# Patient Record
Sex: Male | Born: 1955
Health system: Southern US, Community
[De-identification: ages and names within clinical notes are randomized; demographics above are authoritative.]

## PROBLEM LIST (undated history)

## (undated) DIAGNOSIS — I10 Essential (primary) hypertension: Secondary | ICD-10-CM

## (undated) DIAGNOSIS — I4891 Unspecified atrial fibrillation: Secondary | ICD-10-CM

## (undated) DIAGNOSIS — I639 Cerebral infarction, unspecified: Secondary | ICD-10-CM

## (undated) DIAGNOSIS — M109 Gout, unspecified: Secondary | ICD-10-CM

## (undated) DIAGNOSIS — J45909 Unspecified asthma, uncomplicated: Secondary | ICD-10-CM

## (undated) DIAGNOSIS — Z22322 Carrier or suspected carrier of Methicillin resistant Staphylococcus aureus: Secondary | ICD-10-CM

## (undated) HISTORY — DX: Unspecified atrial fibrillation: I48.91

## (undated) HISTORY — DX: Gout, unspecified: M10.9

## (undated) HISTORY — DX: Unspecified asthma, uncomplicated: J45.909

## (undated) HISTORY — DX: Cerebral infarction, unspecified: I63.9

---

## 2002-05-29 ENCOUNTER — Emergency Department (HOSPITAL_COMMUNITY): Admission: EM | Admit: 2002-05-29 | Discharge: 2002-05-29 | Payer: Self-pay | Admitting: Emergency Medicine

## 2002-05-29 ENCOUNTER — Encounter: Payer: Self-pay | Admitting: Emergency Medicine

## 2007-03-20 DIAGNOSIS — Z22322 Carrier or suspected carrier of Methicillin resistant Staphylococcus aureus: Secondary | ICD-10-CM

## 2007-03-20 HISTORY — DX: Carrier or suspected carrier of methicillin resistant Staphylococcus aureus: Z22.322

## 2007-12-08 ENCOUNTER — Inpatient Hospital Stay (HOSPITAL_COMMUNITY): Admission: EM | Admit: 2007-12-08 | Discharge: 2007-12-12 | Payer: Self-pay | Admitting: Emergency Medicine

## 2010-08-01 NOTE — Discharge Summary (Signed)
[name], [name]              ACCOUNT NO.:  [id number]   MEDICAL RECORD NO.:  [id number]          PATIENT TYPE:  INP   LOCATION:  5523                         FACILITY:  [facility]   PHYSICIAN:  Renee Ramus, MD       DATE OF BIRTH:  April 29, 1955   DATE OF ADMISSION:  12/08/2007  DATE OF DISCHARGE:  12/12/2007                               DISCHARGE SUMMARY   PRIMARY DISCHARGE DIAGNOSIS:  Cellulitis.   SECONDARY DIAGNOSES:  1. Hypertension.  2. Mild increase in LFTs and total bilirubin with transient      thrombocytopenia.   HOSPITAL COURSE:  1. Cellulitis.  The patient is a 55 year old African American male who      was admitted secondary to cellulitis with phlegmon extending into      his groin.  The patient seen in emergency department admitted to      our service.  The patient was placed on broad-spectrum antibiotics.      She had elevated white count upon admission, this decreased      dramatically with antibiotics.  The patient did have a grandchild      who had the same type of infection treated with Bactrim with good      results.  The patient's clinical symptom is now improved      dramatically and he is being sent home with ciprofloxacin 500 mg      p.o. b.i.d. x14 days.  The patient has no previous history of      cellulitis, and has been warned that he will be predisposed to      cellulitis infections in the future.  The patient has no known      compromise of his immune system.  2. Hypertension.  The patient initially was somewhat hypotensive, now      he his hypertension has returned, we will place him on lisinopril      20 mg p.o. daily.  He is to follow up with his primary care      physician post discharge for adjustments of medications and dosage.  3. Transient thrombocytopenia with increased LFTs.  The patient's LFTs      have increased with AST of 141, ALT of 97.  The patient also had a      slight increase in total bilirubin at 1.4.  The patient has no  symptoms of liver disease.  He does admit to alcohol consumption.      We have cautioned him to stop this.  The patient did have transient      thrombocytopenia with platelet count of 110 decreasing to 95 and      increasing to 141 on the day of discharge.   LABS OF NOTE:  1. Initial leukocytosis with a white count of 21.7 decreasing to 9.0      on day of discharge.  2. Thrombocytopenia as above.  3. Mild increase in blood glucose between 104 and 128.  4. Bilirubin and LFTs as previously dictated.  5. Drug screen, on admission shows no evidence of drugs of abuse.  6. CRP of 4.9.  7. Blood cultures x2 showing no growth to date.   STUDIES:  1. CT of the pelvis showing evidence of phlegm on the right groin      without hernia or abscess.  2. Ultrasound of the scrotum showing significant soft tissue swelling      in the right wall with question of edema versus abscess of the      right groin.   MEDICATIONS ON DISCHARGE:  1. Lisinopril 20 mg p.o. daily.  2. Ciprofloxacin 500 mg p.o. daily x14 days.   There were no labs or studies pending at the time of discharge.  The  patient is in stable condition and anxious for discharge.   TIME SPENT:  Thirty five minutes.      Renee Ramus, MD  Electronically Signed     JF/MEDQ  D:  12/12/2007  T:  12/13/2007  Job:  [id number]   cc:   Almond Lint, MD

## 2010-08-01 NOTE — Consult Note (Signed)
NAMESHYLOH, [name] [name]              ACCOUNT NO.:  [id number]   MEDICAL RECORD NO.:  [id number]          PATIENT TYPE:  INP   LOCATION:  5523                         FACILITY:  [facility]   PHYSICIAN:  Almond Lint, MD       DATE OF BIRTH:  [birth year]   DATE OF CONSULTATION:  12/10/2007  DATE OF DISCHARGE:                                 CONSULTATION   REQUESTING PHYSICIAN:  Dr. Janice Norrie.   REASON FOR CONSULTATION:  Groin abscess.   HISTORY OF PRESENT ILLNESS:  [name] [name] is a 55 year old, otherwise  healthy male patient who presented to the ER on December 08, 2007 after  complaints of several days of pain and swelling in the right groin area  with associated redness and fevers.  The area started as a small bump in  the right groin and gradually increased in size with increasing pain.  When he presented to the ER, he was found to have a fever of 103, white  count was 21,700.  CT of the pelvis demonstrated no drainable abscess or  hernia, only inflammatory phlegmonous process.  Since admission, the  patient has been treated with IV Zosyn and vancomycin with concordant  decrease in the leukocytosis and decrease in redness and pain and  overall swelling of the right groin.  The patient has been applying warm  compresses to the right groin area and he has developed a spontaneous  opening in the groin region.  A small pinhole area at the most distal  and medial aspect of the groin draining serous and purulent fluid.  Over  the past 24 hours, the patient has developed new soft scrotal edema on  the right side.   REVIEW OF SYSTEMS:  GU:  No hematuria.  No pain with voiding or  difficulty starting his urine void.  GI:  No pain or pressure with  defecation.  CONSTITUTIONAL:  Fever and chills preadmission.  Otherwise,  review of systems categories are noncontributory.   FAMILY HISTORY:  Noncontributory.   SOCIAL HISTORY:  The patient quit smoking in 2008.  He is married.   PAST MEDICAL HISTORY:   The patient denies.   PAST SURGICAL HISTORY:  Cystocele repair about age 84.   ALLERGIES:  NKDA.   CURRENT MEDICATIONS:  While here are as follows:  Metoprolol, Zosyn, and  vancomycin, IV Dilaudid, IV fluids, p.r.n. Zofran, p.r.n. Tylenol,  p.r.n. plain oxycodone.   PHYSICAL EXAMINATION:  GENERAL:  Pleasant male patient complaining of  continued pain in the right groin, but otherwise improved as compared to  prior to admission.  VITAL SIGNS:  Temperature 98.3, BP 134/93, pulse 73, respirations 20.  PSYCH:  The patient is alert and oriented x3.  His affect is appropriate  to current situation.  NEURO:  Cranial nerves II-XII are grossly intact.  The patient is  ambulatory.  Strength is symmetrical, 6/6 bilaterally.  Sensation is  intact grossly.  CHEST:  Bilateral lung sounds are clear to auscultation.  Respiratory  effort is nonlabored.  He is on room air sating 96%.  CARDIAC:  Heart  sounds are S1 and  S2 without rubs, murmurs, thrills, or gallops.  No  JVD.  No tachycardia.  No peripheral edema.  ABDOMEN:  Soft, nontender,  nondistended with bowel sounds present.  He is tolerating a regular  diet.  GENITOURINARY:  The patient has right groin swelling and induration and  erythema.  In the medial distal aspect, there is a pinhole opening with  serous purulent fluid.  He is quite tender in the groin.  He is mildly  tender in the right scrotal region.  There is edema there.  Both  testicles are palpable, mildly tender on the right.  When the testicle  is pushed up towards the right groin, there is exquisite pain more or  less coming from the groin and not the testicle.  The penis is normal in  size and no drainage.  The scrotal edema again has no pitting and the  estimated size of the groin induration is 7 x 5 cm.   LABORATORY DATA:  White count is now 14,900, hemoglobin 13.2, platelets  99,000.  Sodium 134, potassium 4.2, CO2 of 25, BUN 10, creatinine 1.08.  Total bilirubin 1.6,  AST 73, ALT 71.  Blood cultures pending.  Diagnostic CT of the pelvis was done with contrast.  This again  demonstrated an inflammatory phlegmon in the right groin, subcu tissues  without hernia or abscess.   IMPRESSION:  1. Right groin cellulitis with evolving abscess with extension into      the scrotum.  2. Leukocytosis, which is improved.   PLAN:  1. Continue current medical therapy, the patient is overall improving.      Continue symptomatic treating with pain medications and continue IV      Zosyn and vancomycin.  2. The area is spontaneously draining, would continue warm compresses.      The patient may or may not need incision and drainage in the OR.  I      will make him n.p.o. after midnight.  In any event, we need to      proceed with operative intervention since he is spontaneously      draining and there is no identifiable abscess seen on CT.  This is      unlikely.  3. New scrotal swelling.  We need to check a scrotal ultrasound to      rule out abscess or other intrascrotal process.  4. We will go ahead and give him 2 doses of IV Toradol over the next 8      hours for inflammatory pain and changes.  5. Encourage use of p.o. pain medicines to increase duration of effect      of the pain medications.      Allison L. Rennis Harding, N.P.      Almond Lint, MD  Electronically Signed    ALE/MEDQ  D:  12/10/2007  T:  12/11/2007  Job:  [id number]   cc:   Dr. Janice Norrie

## 2010-08-01 NOTE — H&P (Signed)
NAMEJAMEN, LOISEAU              ACCOUNT NO.:  [id number]   MEDICAL RECORD NO.:  [id number]          PATIENT TYPE:  INP   LOCATION:  1833                         FACILITY:  [facility]   PHYSICIAN:  Lonia Blood, M.D.      DATE OF BIRTH:  20-Oct-1955   DATE OF ADMISSION:  12/08/2007  DATE OF DISCHARGE:                              HISTORY & PHYSICAL   PRIMARY CARE PHYSICIAN:  The patient is unassigned.   PRESENTING COMPLAINT:  Right groin swelling, pain, and fever.   HISTORY OF PRESENT ILLNESS:  The patient is a 55 year old gentleman with  no significant past medical history who presented with swelling, pain,  and fever in his right groin.  The area swollen over the past couple of  days is red.  The patient has not had a previous infection.  His  grandson, however, had three different infections that were treated as  staph infections.  The swelling has enlarged and last night he and his  wife tried to squeeze pus out of it and get some drains.  He was seen  in the ED.  The area looks indurated and no I and D was performed.   His past medical history is none.   ALLERGIES:  No known drug allergies.   MEDICATIONS:  No current medications.   SOCIAL HISTORY:  The patient lives in [place] with his wife.  He  denied any current tobacco.  He quit smoking last year.  Prior to that,  he was smoking about half pack per day.   FAMILY HISTORY:  No family history of diabetes, hypertension, etc.   REVIEW OF SYSTEMS:  A 12-point review of systems is negative except per  HPI.   PHYSICAL EXAMINATION:  VITAL SIGNS:  Temperature was 103, blood pressure  initially 75/52, pulse of 110, respiratory rate 28, sats 96% on room  air.  Currently, blood pressure is 140/74 with a pulse of 89.  GENERAL:  The patient is awake, alert, oriented, stable in no acute  distress.  HEENT:  PERRLA.  EOMI.  NECK:  Supple.  No JVD, no lymphadenopathy.  RESPIRATORY:  He has good air entry bilaterally.  No wheezes or  rales.  CARDIOVASCULAR:  The patient has S1 and S2.  No murmur.  ABDOMEN:  Soft and nontender with positive bowel sounds.  EXTREMITIES:  No edema, cyanosis, or clubbing.   LABS:  Sodium 136, potassium 3.8, chloride 104, BUN 18, creatinine 1.2,  glucose 128, ionized calcium 1.23.  White count is 21.7, hemoglobin  15.9, and platelet count 110 with ANC of 17.  CT of abdomen and pelvis  without contrast showed inflammatory phlegmon.  CT pelvis with contrast  medium showed inflammatory phlegmon in the right groin subcutaneous  tissues without hernia or abscess identified.  This extends down into  the right scrotum.  No intraperitoneal abscess.  Also, mildly dilated  loop of small bowel above the level of iliac crest.  No distal dilated  small bowel evident.   ASSESSMENT:  Therefore, this is a 55 year old gentleman presenting with  what appears to be a cellulitis of his  medial thigh involving the  inguinal region, that is indurated, red, tender.  Also, some  leukocytosis and hypotension.  Sepsis-like picture.   PLAN:  1. Right medial thigh cellulitis and phlegmon.  We will admit the      patient and start him vancomycin and Zosyn.  Most likely this is      staph infection with a history of contact with his grandson.  We      will, however, empirically add Zosyn until will get blood cultures      back.  We will try and see if we can get a wound culture since it      has something like a small opening and continuous pain control.      Once we have identified the bacteria, we will switch him to oral      antibiotics and discharge him.  2. Hypertension.  Currently blood pressure has improved.  3. Leukocytosis.  This is most likely from his infection.  4. Thrombocytopenia.  The patient had thrombocytopenia, which may be      reactive.  We will follow his CBC and BMET.  Otherwise, further      treatment will depend on the patient's response in the hospital.      Lonia Blood, M.D.   Electronically Signed     LG/MEDQ  D:  12/08/2007  T:  12/09/2007  Job:  [id number]

## 2010-12-18 LAB — CBC
HCT: 47.4
Hemoglobin: 14.2
Hemoglobin: 15.9
MCHC: 33.5
MCHC: 33.5
MCHC: 33.6
MCHC: 33.8
MCHC: 34.2
MCV: 96.8
MCV: 97
MCV: 97.7
MCV: 98
Platelets: 101 — ABNORMAL LOW
Platelets: 110 — ABNORMAL LOW
Platelets: 141 — ABNORMAL LOW
RBC: 4.01 — ABNORMAL LOW
RBC: 4.32
RBC: 4.85
RDW: 13.8
RDW: 14
WBC: 21.7 — ABNORMAL HIGH

## 2010-12-18 LAB — POCT I-STAT, CHEM 8
BUN: 18
Chloride: 104
Creatinine, Ser: 1.2
Sodium: 136

## 2010-12-18 LAB — DIFFERENTIAL
Basophils Absolute: 0.1
Basophils Relative: 0
Eosinophils Absolute: 0.1
Eosinophils Relative: 0
Eosinophils Relative: 5
Lymphocytes Relative: 12
Lymphocytes Relative: 19
Lymphs Abs: 1.7
Lymphs Abs: 2.6
Monocytes Absolute: 2 — ABNORMAL HIGH
Monocytes Relative: 9
Neutro Abs: 17 — ABNORMAL HIGH
Neutro Abs: 5.7
Neutrophils Relative %: 78 — ABNORMAL HIGH

## 2010-12-18 LAB — BASIC METABOLIC PANEL
BUN: 11
CO2: 22
Chloride: 107
Creatinine, Ser: 1.01
Glucose, Bld: 104 — ABNORMAL HIGH

## 2010-12-18 LAB — COMPREHENSIVE METABOLIC PANEL
ALT: 71 — ABNORMAL HIGH
AST: 141 — ABNORMAL HIGH
AST: 73 — ABNORMAL HIGH
Albumin: 3 — ABNORMAL LOW
BUN: 11
BUN: 13
CO2: 21
CO2: 25
CO2: 26
Calcium: 8.7
Calcium: 8.9
Calcium: 9.6
Creatinine, Ser: 0.83
Creatinine, Ser: 1.28
GFR calc Af Amer: >60
GFR calc Af Amer: >60
GFR calc non Af Amer: 59 — ABNORMAL LOW
GFR calc non Af Amer: >60
GFR calc non Af Amer: >60
Glucose, Bld: 95
Sodium: 134 — ABNORMAL LOW
Total Protein: 5.9 — ABNORMAL LOW

## 2010-12-18 LAB — WOUND CULTURE

## 2010-12-18 LAB — CULTURE, BLOOD (ROUTINE X 2)

## 2010-12-18 LAB — HEMOGLOBIN A1C
Hgb A1c MFr Bld: 5.5
Mean Plasma Glucose: 111

## 2010-12-18 LAB — DRUGS OF ABUSE SCREEN W/O ALC, ROUTINE URINE
Cocaine Metabolites: NEGATIVE
Creatinine,U: 90.5
Methadone: NEGATIVE
Opiate Screen, Urine: NEGATIVE
Propoxyphene: NEGATIVE

## 2018-02-20 ENCOUNTER — Inpatient Hospital Stay (HOSPITAL_COMMUNITY)
Admission: EM | Admit: 2018-02-20 | Discharge: 2018-02-22 | DRG: 065 | Disposition: A | Discharge status: Home / Self Care | Payer: Self-pay | Attending: Internal Medicine | Admitting: Internal Medicine

## 2018-02-20 ENCOUNTER — Emergency Department (HOSPITAL_COMMUNITY): Payer: Self-pay

## 2018-02-20 ENCOUNTER — Other Ambulatory Visit: Payer: Self-pay

## 2018-02-20 ENCOUNTER — Encounter (HOSPITAL_COMMUNITY): Payer: Self-pay | Admitting: Emergency Medicine

## 2018-02-20 DIAGNOSIS — G4730 Sleep apnea, unspecified: Secondary | ICD-10-CM | POA: Diagnosis present

## 2018-02-20 DIAGNOSIS — G8191 Hemiplegia, unspecified affecting right dominant side: Secondary | ICD-10-CM | POA: Diagnosis present

## 2018-02-20 DIAGNOSIS — D696 Thrombocytopenia, unspecified: Secondary | ICD-10-CM | POA: Diagnosis present

## 2018-02-20 DIAGNOSIS — I4891 Unspecified atrial fibrillation: Secondary | ICD-10-CM

## 2018-02-20 DIAGNOSIS — Z87891 Personal history of nicotine dependence: Secondary | ICD-10-CM

## 2018-02-20 DIAGNOSIS — Z79899 Other long term (current) drug therapy: Secondary | ICD-10-CM

## 2018-02-20 DIAGNOSIS — B1920 Unspecified viral hepatitis C without hepatic coma: Secondary | ICD-10-CM | POA: Diagnosis present

## 2018-02-20 DIAGNOSIS — R0789 Other chest pain: Secondary | ICD-10-CM | POA: Diagnosis present

## 2018-02-20 DIAGNOSIS — R531 Weakness: Secondary | ICD-10-CM

## 2018-02-20 DIAGNOSIS — I10 Essential (primary) hypertension: Secondary | ICD-10-CM | POA: Diagnosis present

## 2018-02-20 DIAGNOSIS — Z8249 Family history of ischemic heart disease and other diseases of the circulatory system: Secondary | ICD-10-CM

## 2018-02-20 DIAGNOSIS — Z7982 Long term (current) use of aspirin: Secondary | ICD-10-CM

## 2018-02-20 DIAGNOSIS — R740 Nonspecific elevation of levels of transaminase and lactic acid dehydrogenase [LDH]: Secondary | ICD-10-CM

## 2018-02-20 DIAGNOSIS — F1011 Alcohol abuse, in remission: Secondary | ICD-10-CM

## 2018-02-20 DIAGNOSIS — I480 Paroxysmal atrial fibrillation: Secondary | ICD-10-CM | POA: Diagnosis present

## 2018-02-20 DIAGNOSIS — F17211 Nicotine dependence, cigarettes, in remission: Secondary | ICD-10-CM

## 2018-02-20 DIAGNOSIS — I639 Cerebral infarction, unspecified: Secondary | ICD-10-CM

## 2018-02-20 DIAGNOSIS — F4521 Hypochondriasis: Secondary | ICD-10-CM | POA: Diagnosis present

## 2018-02-20 DIAGNOSIS — Z885 Allergy status to narcotic agent status: Secondary | ICD-10-CM

## 2018-02-20 DIAGNOSIS — E785 Hyperlipidemia, unspecified: Secondary | ICD-10-CM | POA: Diagnosis present

## 2018-02-20 DIAGNOSIS — R7401 Elevation of levels of liver transaminase levels: Secondary | ICD-10-CM

## 2018-02-20 DIAGNOSIS — R079 Chest pain, unspecified: Secondary | ICD-10-CM

## 2018-02-20 DIAGNOSIS — R208 Other disturbances of skin sensation: Secondary | ICD-10-CM

## 2018-02-20 DIAGNOSIS — R29700 NIHSS score 0: Secondary | ICD-10-CM | POA: Diagnosis present

## 2018-02-20 DIAGNOSIS — I6340 Cerebral infarction due to embolism of unspecified cerebral artery: Principal | ICD-10-CM | POA: Diagnosis present

## 2018-02-20 DIAGNOSIS — Z823 Family history of stroke: Secondary | ICD-10-CM

## 2018-02-20 HISTORY — DX: Carrier or suspected carrier of methicillin resistant Staphylococcus aureus: Z22.322

## 2018-02-20 HISTORY — DX: Essential (primary) hypertension: I10

## 2018-02-20 LAB — CBC WITH DIFFERENTIAL/PLATELET
ABS IMMATURE GRANULOCYTES: 0.02 10*3/uL (ref 0.00–0.07)
BASOS ABS: 0 10*3/uL (ref 0.0–0.1)
BASOS PCT: 1 %
Eosinophils Absolute: 0.1 10*3/uL (ref 0.0–0.5)
Eosinophils Relative: 2 %
HCT: 50.5 % (ref 39.0–52.0)
Hemoglobin: 16.1 g/dL (ref 13.0–17.0)
Immature Granulocytes: 0 %
LYMPHS ABS: 2.4 10*3/uL (ref 0.7–4.0)
Lymphocytes Relative: 36 %
MCH: 30.4 pg (ref 26.0–34.0)
MCHC: 31.9 g/dL (ref 30.0–36.0)
MCV: 95.5 fL (ref 80.0–100.0)
MONOS PCT: 11 %
Monocytes Absolute: 0.7 10*3/uL (ref 0.1–1.0)
NEUTROS ABS: 3.3 10*3/uL (ref 1.7–7.7)
NRBC: 0 % (ref 0.0–0.2)
Neutrophils Relative %: 50 %
PLATELETS: 126 10*3/uL — AB (ref 150–400)
RBC: 5.29 MIL/uL (ref 4.22–5.81)
RDW: 14 % (ref 11.5–15.5)
WBC: 6.5 10*3/uL (ref 4.0–10.5)

## 2018-02-20 LAB — COMPREHENSIVE METABOLIC PANEL
ALT: 251 U/L — ABNORMAL HIGH (ref 0–44)
AST: 302 U/L — AB (ref 15–41)
Albumin: 3.6 g/dL (ref 3.5–5.0)
Alkaline Phosphatase: 95 U/L (ref 38–126)
Anion gap: 11 (ref 5–15)
BUN: 17 mg/dL (ref 8–23)
CHLORIDE: 106 mmol/L (ref 98–111)
CO2: 19 mmol/L — AB (ref 22–32)
Calcium: 9.6 mg/dL (ref 8.9–10.3)
Creatinine, Ser: 0.95 mg/dL (ref 0.61–1.24)
GFR calc Af Amer: >60 mL/min (ref >60)
GFR calc non Af Amer: >60 mL/min (ref >60)
GLUCOSE: 101 mg/dL — AB (ref 70–99)
POTASSIUM: 4.2 mmol/L (ref 3.5–5.1)
SODIUM: 136 mmol/L (ref 135–145)
Total Bilirubin: 2 mg/dL — ABNORMAL HIGH (ref 0.3–1.2)
Total Protein: 7.3 g/dL (ref 6.5–8.1)

## 2018-02-20 LAB — RAPID URINE DRUG SCREEN, HOSP PERFORMED
Amphetamines: NOT DETECTED
BARBITURATES: NOT DETECTED
Benzodiazepines: NOT DETECTED
COCAINE: NOT DETECTED
OPIATES: NOT DETECTED
Tetrahydrocannabinol: NOT DETECTED

## 2018-02-20 LAB — URINALYSIS, ROUTINE W REFLEX MICROSCOPIC
BILIRUBIN URINE: NEGATIVE
Glucose, UA: NEGATIVE mg/dL
KETONES UR: NEGATIVE mg/dL
Leukocytes, UA: NEGATIVE
Nitrite: NEGATIVE
Protein, ur: NEGATIVE mg/dL
Specific Gravity, Urine: 1.021 (ref 1.005–1.030)
pH: 5 (ref 5.0–8.0)

## 2018-02-20 LAB — ETHANOL: Alcohol, Ethyl (B): <10 mg/dL (ref <10)

## 2018-02-20 LAB — TSH: TSH: 3.086 u[IU]/mL (ref 0.350–4.500)

## 2018-02-20 LAB — TROPONIN I: Troponin I: <0.03 ng/mL (ref <0.03)

## 2018-02-20 MED ORDER — ACETAMINOPHEN 325 MG PO TABS
650.0000 mg | ORAL_TABLET | Freq: Four times a day (QID) | ORAL | Status: DC | PRN
  Administered 2018-02-21 – 2018-02-22 (×2): 650 mg via ORAL
  Filled 2018-02-20 (×2): qty 2

## 2018-02-20 MED ORDER — SENNOSIDES-DOCUSATE SODIUM 8.6-50 MG PO TABS: 1.0000 | ORAL_TABLET | Freq: Every evening | ORAL | Status: DC | PRN

## 2018-02-20 MED ORDER — RIVAROXABAN 20 MG PO TABS
20.0000 mg | ORAL_TABLET | ORAL | Status: AC
  Administered 2018-02-20: 20 mg via ORAL
  Filled 2018-02-20 (×2): qty 1

## 2018-02-20 MED ORDER — ENOXAPARIN SODIUM 40 MG/0.4ML ~~LOC~~ SOLN: 40.0000 mg | SUBCUTANEOUS | Status: DC

## 2018-02-20 MED ORDER — RIVAROXABAN 20 MG PO TABS
20.0000 mg | ORAL_TABLET | Freq: Every day | ORAL | Status: DC
  Administered 2018-02-21 – 2018-02-22 (×2): 20 mg via ORAL
  Filled 2018-02-20 (×3): qty 1

## 2018-02-20 MED ORDER — METOPROLOL SUCCINATE 12.5 MG HALF TABLET: 12.5000 mg | ORAL_TABLET | Freq: Once | ORAL | Status: DC

## 2018-02-20 MED ORDER — PROMETHAZINE HCL 25 MG PO TABS: 12.5000 mg | ORAL_TABLET | Freq: Four times a day (QID) | ORAL | Status: DC | PRN

## 2018-02-20 MED ORDER — ACETAMINOPHEN 650 MG RE SUPP: 650.0000 mg | Freq: Four times a day (QID) | RECTAL | Status: DC | PRN

## 2018-02-20 MED ORDER — METOPROLOL SUCCINATE ER 25 MG PO TB24
25.0000 mg | ORAL_TABLET | Freq: Once | ORAL | Status: AC
  Administered 2018-02-20: 25 mg via ORAL
  Filled 2018-02-20: qty 1

## 2018-02-20 NOTE — ED Notes (Signed)
Patient transported to X-ray 

## 2018-02-20 NOTE — ED Notes (Signed)
Pt requests update please.

## 2018-02-20 NOTE — ED Notes (Signed)
Pt ambulatory to bathroom with no reported issues. 

## 2018-02-20 NOTE — ED Triage Notes (Signed)
Pt reports a terrible h/a, "sleepiness" and aching central CP that radiates to his right arm causing it to be numb.

## 2018-02-20 NOTE — ED Notes (Signed)
Pt transported to XR.  

## 2018-02-20 NOTE — ED Provider Notes (Signed)
[facility] EMERGENCY DEPARTMENT Provider Note   CSN: [id number] Arrival date & time: 02/20/18  1112     History   Chief Complaint Chief Complaint  Patient presents with  . Chest Pain    HPI [name] [name] is a 62 y.o. male.  HPI Patient presents with a few different complaints.  States he has had chest pain over the last few days.  States he has been more sleepy.  States he is been confused.  States he reached up today while he was in the shower and his right arm went numb at home down and he could not use it.  States it improving a little bit now.  States it started acutely.  Does have some shoulder and neck pain. Past Medical History:  Diagnosis Date  . Hypertension   . MRSA (methicillin resistant staph aureus) culture positive 2009    There are no active problems to display for this patient.   History reviewed. No pertinent surgical history.      Home Medications    Prior to Admission medications   Not on File    Family History History reviewed. No pertinent family history.  Social History Social History   Tobacco Use  . Smoking status: Former Smoker    Years: 53.00    Types: Cigarettes    Last attempt to quit: 07/21/2017    Years since quitting: 0.5  . Smokeless tobacco: Never Used  Substance Use Topics  . Alcohol use: Yes    Comment: occ  . Drug use: Not Currently     Allergies   Codeine   Review of Systems Review of Systems  Constitutional: Positive for appetite change.  HENT: Negative for congestion.   Respiratory: Negative for cough.   Cardiovascular: Positive for chest pain.  Gastrointestinal: Negative for abdominal pain.  Genitourinary: Negative for flank pain.  Musculoskeletal: Positive for back pain.  Skin: Negative for rash.  Neurological: Positive for weakness, numbness and headaches.  Psychiatric/Behavioral: Positive for confusion.     Physical Exam Updated Vital Signs BP (!) 165/140   Pulse 86    Temp 98.3 F (36.8 C) (Oral)   Resp 14   Ht 6\' 4"  (1.93 m)   Wt 104.3 kg   SpO2 97%   BMI 28.00 kg/m   Physical Exam  Constitutional: He appears well-developed.  HENT:  Head: Normocephalic.  Eyes: EOM are normal.  Neck: Neck supple.  Cardiovascular: Normal pulses.  Irregular rhythm.  Pulmonary/Chest: Effort normal.  Abdominal: Soft. There is no tenderness.  Musculoskeletal:       Right lower leg: He exhibits no tenderness.       Left lower leg: He exhibits no tenderness.  Neurological: He is alert.  Awake and appropriate.  Subjectively decreased sensation in right hand although strength appears to be intact.  Good strength in bilateral lower extremities.  Good straight leg raise bilaterally.  Face symmetric.  Skin: Skin is warm. Capillary refill takes less than 2 seconds.     ED Treatments / Results  Labs (all labs ordered are listed, but only abnormal results are displayed) Labs Reviewed  COMPREHENSIVE METABOLIC PANEL - Abnormal; Notable for the following components:      Result Value   CO2 19 (*)    Glucose, Bld 101 (*)    AST 302 (*)    ALT 251 (*)    Total Bilirubin 2.0 (*)    All other components within normal limits  URINALYSIS, ROUTINE W REFLEX  MICROSCOPIC - Abnormal; Notable for the following components:   Color, Urine AMBER (*)    Hgb urine dipstick SMALL (*)    Bacteria, UA RARE (*)    All other components within normal limits  CBC WITH DIFFERENTIAL/PLATELET - Abnormal; Notable for the following components:   Platelets 126 (*)    All other components within normal limits  RAPID URINE DRUG SCREEN, HOSP PERFORMED  ETHANOL  TROPONIN I  TSH    EKG EKG Interpretation  Date/Time:  Thursday February 20 2018 11:20:08 EST Ventricular Rate:  119 PR Interval:    QRS Duration: 101 QT Interval:  336 QTC Calculation: 473 R Axis:   -12 Text Interpretation:  Atrial fibrillation with rapid ventricular response Confirmed by Raeford RazorKohut, Stephen [phone]([id number]) on 02/20/2018  12:30:14 PM   Radiology Dg Chest 2 View  Result Date: 02/20/2018 CLINICAL DATA:  Mid chest pain for 1 month. EXAM: CHEST - 2 VIEW COMPARISON:  None. FINDINGS: Cardiomediastinal silhouette is normal. Mediastinal contours appear intact. 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 There is no evidence of focal airspace consolidation, pleural effusion or pneumothorax. Osseous structures are without acute abnormality. Soft tissues are grossly normal. IMPRESSION: No active cardiopulmonary disease. 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 Electronically Signed   By: Ted Mcalpineobrinka  Dimitrova M.D.   On: 02/20/2018 12:23   Ct Head Wo Contrast  Result Date: 02/20/2018 CLINICAL DATA:  Headache EXAM: CT HEAD WITHOUT CONTRAST TECHNIQUE: Contiguous axial images were obtained from the base of the skull through the vertex without intravenous contrast. COMPARISON:  None. FINDINGS: Brain: Minimal small vessel ischemic disease periventricular white matter. No intra-axial mass nor extra-axial fluid. No hydrocephalus. Midline fourth ventricle basal cisterns without effacement. No large vascular territory infarct. Vascular: No hyperdense vessel sign. Mild atherosclerosis of the carotid siphons. Skull: Intact Sinuses/Orbits: No acute finding. Other: None. IMPRESSION: Minimal small vessel ischemic disease of periventricular white matter, age indeterminate in the absence of prior studies more likely chronic given vascular calcifications at the skull base. No acute intracranial appearing abnormality. Electronically Signed   By: Tollie Ethavid  Kwon M.D.   On: 02/20/2018 14:36    Procedures Procedures (including critical care time)  Medications Ordered in ED Medications - No data to display   Initial Impression / Assessment and Plan / ED Course  I have reviewed the triage vital signs and the nursing notes.  Pertinent labs & imaging results that were available during my care of the patient were reviewed by me and  considered in my medical decision making (see chart for details).     Patient presented with few different complaints.  Chest pain.  Headaches.  States that earlier today his arm went numb and he could not use it.  Right arm is improving.  However patient appears to be in a new onset A. Fib. Chadsvasc of 1 for hypertension.  Head CT reassuring.  AST and ALT are somewhat elevated.  Patient states he has had hepatitis previously.  Will admit to unassigned internal medicine for further work-up.  Final Clinical Impressions(s) / ED Diagnoses   Final diagnoses:  Atrial fibrillation, unspecified type (HCC)  Hypertension, unspecified type  Chest pain, unspecified type  Weakness    ED Discharge Orders    None       Benjiman CorePickering, Kim Lauver, MD 02/20/18 1552

## 2018-02-20 NOTE — H&P (Addendum)
Date: 02/20/2018               Patient Name:  [name] [name] MRN: [id number]  DOB: 05-Jul-1955 Age / Sex: 62 y.o., male   PCP: Patient, No Pcp Per         Medical Service: Internal Medicine Teaching Service         Attending Physician: Dr. Inez Catalina, MD    First Contact: Dr. Petra Kuba Pager: [phone]  Second Contact: Dr. Delma Officer Pager: [phone]       After Hours (After 5p/  First Contact Pager: [phone]  weekends / holidays): Second Contact Pager: [phone]   Chief Complaint: chest pain, arm numbness  History of Present Illness:  62 year old male with uncontrolled hypertension, former tobacco and alcohol abuse who presents for evaluation of new onset right arm numbness and weakness as well as 1 month of worsening left-sided chest pain.   He is accompanied by his wife.  He states that while in the shower this morning he raised his right arm above his head and immediately experienced numbness of the right arm and then his arm became "dead weight".  His arm fell down to his side and he felt like he had been "punched in his right shoulder".  He then reports trying to pick up the soap with his right hand but could not get his hand to work.  His strength gradually returned within minutes but the numbness remained.   After he got out of the shower he experienced an abrupt, sharp pain located "underneath his rib" on the left side of his chest with associated epigastric discomfort/indigestion.  He denied associated palpitations, vomiting, nausea, diaphoresis, jaw numbness, neck pain. He does endorse intermittent dizziness, worse with standing, and admits to almost falling down.   He reports a history of intermittent numbness in his hands which he attributed to decreased blood flow from his significant tobacco use.  The chest pain he is currently describing has been new over the last 2 to 3 weeks and feels different from his previous chest pain which is located over the center of his chest and  described as a muscle ache.   The chest pain will occur at rest or at exertion. However, he has been able to perform extensive yard work without having chest pain.   He has not seen a doctor in many years and has roughly a 30-pack-year smoking history but proudly announces that he has gone 6 months without smoking after quitting cold [place].  Currently he endorses improved, but residual numbness in the right forearm and intermittent left sided chest pain with epigastric discomfort. He is also experiencing headache (which he attributes to his out of date glasses prescription).   Meds:  Over the counter nasal spray for sinus allergies  Used to be on a blood pressure medicine 10 years ago    Allergies: Allergies as of 02/20/2018 - Review Complete 02/20/2018  Allergen Reaction Noted  . Codeine Nausea And Vomiting 02/20/2018   Past Medical History:  Diagnosis Date  . Hypertension   . MRSA (methicillin resistant staph aureus) culture positive 2009    Family History: Stroke (Dad in his 21s). CAD (mom with triple bypass)    Social History: retired, did many jobs Electrical engineer, courier, oil changes). 30 pack year smoking history but quit 6 months ago. Social drinker, used to drink a lot but now only "pounds them" on special occasions (6 pack plus a couple shots). Distant history of IV  drug use.   Review of Systems: A complete ROS was negative except as per HPI.   Physical Exam: Blood pressure (!) 180/122, pulse (!) 104, temperature 98.3 F (36.8 C), temperature source Oral, resp. rate 14, height 6\' 4"  (1.93 m), weight 104.3 kg, SpO2 99 %. Vitals:   02/20/18 1330 02/20/18 1400 02/20/18 1706 02/20/18 1715  BP: (!) 164/116 (!) 165/140 (!) 173/107 (!) 180/122  Pulse: 94 86 97 (!) 104  Resp: 17 14 15 14   Temp:      TempSrc:      SpO2: 98% 97% 97% 99%  Weight:      Height:       General: Vital signs reviewed.  Patient is well-developed and well-nourished, in no acute distress and  cooperative with exam.  Head: Normocephalic and atraumatic. Eyes: EOMI, conjunctivae normal, no scleral icterus.  Neck: Supple, trachea midline, normal ROM, no JVD, masses, thyromegaly, or carotid bruit present.  Cardiovascular: irregularly irregular, S1 normal, S2 normal, no murmurs, gallops, or rubs. Pulmonary/Chest: Clear to auscultation bilaterally, no wheezes, rales, or rhonchi. Abdominal: Soft, non-tender, non-distended, BS +, no masses, organomegaly, or guarding present.  Musculoskeletal: No joint deformities, erythema, or stiffness, ROM full and nontender. Extremities: No lower extremity edema bilaterally,  pulses symmetric and intact bilaterally. No cyanosis or clubbing. Neurological: A&O x3, Strength is normal and symmetric bilaterally, cranial nerve II-XII are grossly intact, no focal motor deficit, decreased sensation in right forearm but otherwise normal and symmetric  Skin: Warm, dry and intact. No rashes or erythema. Tattoos on both arms Psychiatric: Normal mood and affect. speech and behavior is normal. Cognition and memory are normal.    EKG: personally reviewed my interpretation is a. Fib with RVR, HR 119, no q waves, no ST or T wave changes   CXR: personally reviewed my interpretation is no pulmonary infiltrate, no bony abnormality, mild calcific atherosclerotic aortic disease  Head CT: Minimal small vessel ischemic disease of periventricular white matter, age indeterminate in the absence of prior studies more likely chronic given vascular calcifications at the skull base. No acute intracranial appearing abnormality.  Assessment & Plan by Problem: Active Problems:   TIA (transient ischemic attack)   Paroxysmal atrial fibrillation (HCC)  62 year old male with uncontrolled hypertension, former tobacco and alcohol abuse who presents for evaluation of new onset right arm numbness and weakness as well as 1 month of worsening left-sided chest pain.  Arm numbness: Acute  onset provoked by arm raise. Accompanied by weakness. Improved without intervention. Took 325mg  aspirin at home. Residual numbness remains on right forearm and neuro exam is otherwise unremarkable. CT head was negative for acute abnormality. Doubt TIA given symptoms were provoked with arm raise. More suspicious for claudication or impingement pathology. Most likely has atherosclerotic disease given uncontrolled HTN and prior tobacco abuse.  - aspirin 81mg   - consider right shoulder MRI tomorrow   Chest pain: Intermittent, sharp, left sided without associated n/v, diaphoresis, or jaw numbness. Occurs at rest, not provoked by exertion.  - EKG a fib with RVR without ischemic changes  - initial troponin negative, will trend q6h  A. Fib with RVR: HR 80-106, exact onset is unclear, but could have started in the last month and could explain his chest pain. Tachy in the low 100s, hypertensive. Denies palpitations or sob.  - CHADVASC score 2 (HTN, aortic plaque on CXR) - xarelto per pharmacy  - rate control with metoprolol XR 25mg  daily, can add IV diltiazem if not rate controlled.  -  echo  Transaminitis: Reports history of "exposure to hepatitis" but has never been treated. Has tattoos and distant history of IV drug use.  - hep c  FEN/GI: HH diet  DVT ppx: xarelto   Dispo: Admit patient to Inpatient with expected length of stay greater than 2 midnights.  Signed: Ali Lowe, MD 02/20/2018, 5:42 PM  Pager: [phone]

## 2018-02-20 NOTE — Progress Notes (Signed)
ANTICOAGULATION CONSULT NOTE - Initial Consult  Pharmacy Consult for Xarelto Indication: atrial fibrillation  Allergies  Allergen Reactions  . Codeine Nausea And Vomiting    Patient Measurements: Height: 6\' 4"  (193 cm) Weight: 230 lb (104.3 kg) IBW/kg (Calculated) : 86.8  Vital Signs: Temp: 98.3 F (36.8 C) (12/05 1122) Temp Source: Oral (12/05 1122) BP: 180/122 (12/05 1715) Pulse Rate: 104 (12/05 1715)  Labs: Recent Labs    02/20/18 1137  HGB 16.1  HCT 50.5  PLT 126*  CREATININE 0.95  TROPONINI <0.03    Estimated Creatinine Clearance: 105.6 mL/min (by C-G formula based on SCr of 0.95 mg/dL).   Medical History: Past Medical History:  Diagnosis Date  . Hypertension   . MRSA (methicillin resistant staph aureus) culture positive 2009    Assessment: CC/HPI: CP for a few days with increased sleepiness and confusion. New onset afib.  PMH: HTN, MRSA 2009, hepatitis  Anticoag: Afib to start Xarelto. Hgb 16.1 WNL. Plts 126, CrCl>100  Goal of Therapy:  Therapeutic oral anticoagulation   Plan: New Xarelto 20mg  daily F/u w/u for elevated LFT's   [name] [name], PharmD, BCPS Clinical Staff Pharmacist [name] [name], [name] [name] 02/20/2018,7:14 PM

## 2018-02-20 NOTE — ED Notes (Signed)
Patient transported to CT 

## 2018-02-21 ENCOUNTER — Inpatient Hospital Stay (HOSPITAL_COMMUNITY): Payer: Self-pay

## 2018-02-21 DIAGNOSIS — I361 Nonrheumatic tricuspid (valve) insufficiency: Secondary | ICD-10-CM

## 2018-02-21 DIAGNOSIS — G8321 Monoplegia of upper limb affecting right dominant side: Secondary | ICD-10-CM

## 2018-02-21 DIAGNOSIS — I4891 Unspecified atrial fibrillation: Secondary | ICD-10-CM

## 2018-02-21 DIAGNOSIS — Z87891 Personal history of nicotine dependence: Secondary | ICD-10-CM

## 2018-02-21 DIAGNOSIS — I340 Nonrheumatic mitral (valve) insufficiency: Secondary | ICD-10-CM

## 2018-02-21 DIAGNOSIS — I639 Cerebral infarction, unspecified: Secondary | ICD-10-CM

## 2018-02-21 LAB — BASIC METABOLIC PANEL
Anion gap: 10 (ref 5–15)
BUN: 20 mg/dL (ref 8–23)
CHLORIDE: 106 mmol/L (ref 98–111)
CO2: 20 mmol/L — ABNORMAL LOW (ref 22–32)
Calcium: 9.5 mg/dL (ref 8.9–10.3)
Creatinine, Ser: 0.97 mg/dL (ref 0.61–1.24)
GFR calc Af Amer: >60 mL/min (ref >60)
GFR calc non Af Amer: >60 mL/min (ref >60)
Glucose, Bld: 92 mg/dL (ref 70–99)
POTASSIUM: 4.4 mmol/L (ref 3.5–5.1)
Sodium: 136 mmol/L (ref 135–145)

## 2018-02-21 LAB — CBC
HCT: 46.1 % (ref 39.0–52.0)
Hemoglobin: 15.1 g/dL (ref 13.0–17.0)
MCH: 30.7 pg (ref 26.0–34.0)
MCHC: 32.8 g/dL (ref 30.0–36.0)
MCV: 93.7 fL (ref 80.0–100.0)
Platelets: 102 10*3/uL — ABNORMAL LOW (ref 150–400)
RBC: 4.92 MIL/uL (ref 4.22–5.81)
RDW: 14.1 % (ref 11.5–15.5)
WBC: 5.9 10*3/uL (ref 4.0–10.5)
nRBC: 0 % (ref 0.0–0.2)

## 2018-02-21 LAB — LIPID PANEL
Cholesterol: 133 mg/dL (ref 0–200)
HDL: 25 mg/dL — ABNORMAL LOW (ref >40)
LDL Cholesterol: 84 mg/dL (ref 0–99)
Total CHOL/HDL Ratio: 5.3 RATIO
Triglycerides: 120 mg/dL (ref <150)
VLDL: 24 mg/dL (ref 0–40)

## 2018-02-21 LAB — HEMOGLOBIN A1C
Hgb A1c MFr Bld: 5.1 % (ref 4.8–5.6)
Mean Plasma Glucose: 99.67 mg/dL

## 2018-02-21 LAB — HIV ANTIBODY (ROUTINE TESTING W REFLEX): HIV Screen 4th Generation wRfx: NONREACTIVE

## 2018-02-21 LAB — ECHOCARDIOGRAM COMPLETE
Height: 76 in
Weight: 3680 oz

## 2018-02-21 LAB — TROPONIN I

## 2018-02-21 MED ORDER — IOPAMIDOL (ISOVUE-370) INJECTION 76%
100.0000 mL | Freq: Once | INTRAVENOUS | Status: AC | PRN
  Administered 2018-02-21: 100 mL via INTRAVENOUS

## 2018-02-21 MED ORDER — ASPIRIN EC 81 MG PO TBEC
81.0000 mg | DELAYED_RELEASE_TABLET | Freq: Every day | ORAL | Status: DC
  Administered 2018-02-21 – 2018-02-22 (×2): 81 mg via ORAL
  Filled 2018-02-21 (×2): qty 1

## 2018-02-21 MED ORDER — LISINOPRIL 5 MG PO TABS: 5.0000 mg | ORAL_TABLET | Freq: Every day | ORAL | 2 refills | Status: DC

## 2018-02-21 MED ORDER — LISINOPRIL 5 MG PO TABS
5.0000 mg | ORAL_TABLET | Freq: Every day | ORAL | Status: DC
  Administered 2018-02-21: 5 mg via ORAL
  Filled 2018-02-21: qty 1

## 2018-02-21 MED ORDER — METOPROLOL SUCCINATE ER 25 MG PO TB24
25.0000 mg | ORAL_TABLET | Freq: Once | ORAL | Status: DC
  Filled 2018-02-21: qty 1

## 2018-02-21 MED ORDER — ATORVASTATIN CALCIUM 80 MG PO TABS
80.0000 mg | ORAL_TABLET | Freq: Every day | ORAL | Status: DC
  Administered 2018-02-21 – 2018-02-22 (×2): 80 mg via ORAL
  Filled 2018-02-21 (×2): qty 1

## 2018-02-21 MED ORDER — IOPAMIDOL (ISOVUE-370) INJECTION 76%
INTRAVENOUS | Status: AC
  Filled 2018-02-21: qty 100

## 2018-02-21 MED ORDER — RIVAROXABAN 20 MG PO TABS: 20.0000 mg | ORAL_TABLET | Freq: Every day | ORAL | 2 refills | Status: DC

## 2018-02-21 MED ORDER — METOPROLOL SUCCINATE ER 25 MG PO TB24: 25.0000 mg | ORAL_TABLET | Freq: Every day | ORAL | 2 refills | Status: DC

## 2018-02-21 MED ORDER — ATORVASTATIN CALCIUM 40 MG PO TABS: 40.0000 mg | ORAL_TABLET | Freq: Every day | ORAL | 2 refills | Status: DC

## 2018-02-21 MED ORDER — METOPROLOL SUCCINATE ER 25 MG PO TB24
25.0000 mg | ORAL_TABLET | Freq: Once | ORAL | Status: AC
  Administered 2018-02-21: 25 mg via ORAL
  Filled 2018-02-21: qty 1

## 2018-02-21 MED FILL — LISINOPRIL 5 MG TABLET: 5 | 30 days supply | Qty: 30 | Fill #0 | Status: TO

## 2018-02-21 MED FILL — ATORVASTATIN CALCIUM 40 MG: 40 | 30 days supply | Qty: 30 | Fill #0 | Status: TO

## 2018-02-21 MED FILL — METOPROLOL SUCCINATE ER 25: 25 | 30 days supply | Qty: 30 | Fill #0 | Status: TO

## 2018-02-21 MED FILL — XARELTO 20 MG TABLET: 20 | 30 days supply | Qty: 30 | Fill #0 | Status: TO

## 2018-02-21 NOTE — Progress Notes (Signed)
   Subjective: NAEON. Denies anymore chest pain or arm weakness. Still endorsing a small region of right arm numbness.   Objective:  Vital signs in last 24 hours: Vitals:   02/20/18 2006 02/20/18 2200 02/21/18 0530 02/21/18 0733  BP: (!) 140/100 (!) 165/98 (!) 155/120 (!) 155/106  Pulse: 94 84 86 69  Resp: 14 18 18 20   Temp: 98.6 F (37 C) 98.2 F (36.8 C) 98.4 F (36.9 C) 97.8 F (36.6 C)  TempSrc: Oral Oral Oral Oral  SpO2: 99% 100% 100% 99%  Weight:      Height:       Gen: sitting up in bed. NAD Card: irregularly irregular, no m/r/g, no JVD Neuro: 5/5 strength in all extremities, decreased sensation over dorsal right forearm Ext: no LEE, DP pulses symmetric  MSK: right shoulder with full ROM, non-tender    Assessment/Plan:  Active Problems:   TIA (transient ischemic attack)   Paroxysmal atrial fibrillation (HCC)  62 year old male with uncontrolled hypertension, former tobacco and alcohol abuse who presents for evaluation of new onset right arm numbness and weakness as well as 1 month of worsening left-sided chest pain.  1. Arm numbness: improving. Denies anymore events of arm weakness or shoulder pain. Given his uncontrolled HTN, there is concern for lacunar infarct which could result in a sensory deficit stroke. - brain MR - carotid ultrasound  - risk factor modification: lipid panel, a1c, HTN control   2. Chest pain: resolved. EKG reassuring. Troponins negative x 2. Risk factor modification as above.   3. A. Fib: rate controlled on metoprolol 25mg  daily. Continue xarelto.  - f/u echo   4. Transaminitis: AST: ALT 1 to 1 ratio more consistent for chronic infection - f/u hepatitis B and C panels   5. HTN: lisinopril 5mg  daily   Dispo: Anticipated discharge in approximately 0-1. If additional imaging today is unremarkable, he can go home. Social work will set him up with community health and wellness and we will [facility] get [facility] to deliver an initial 6997-month  supply of medications   Ali LoweVogel,  S, MD 02/21/2018, 11:11 AM Pager: [phone]

## 2018-02-21 NOTE — Care Management Note (Addendum)
Case Management Note  Patient Details  Name: TRAQUAN DUARTE MRN: [id number] Date of Birth: 12/06/1955  Subjective/Objective:     Patient in to r/o CVA. He is from home with his son and DIL.  DME: none Pt has no insurance and no PCP. Pt states son and DIL can provide transportation.               Action/Plan: CM met with the patient and he would like to be connected to one of the [facility]. CM was able to get him an appointment at [facility]. Information on the AVS.  CM provided him information on the [facility] pharmacy also so he can receive assistance with his medications.   Addendum: CM did MATCH for his meds through the [facility]. Pt to receive meds to the bedside. RN updated.   Expected Discharge Date:                  Expected Discharge Plan:     In-House Referral:     Discharge planning Services  CM Consult, [facility], Medication Assistance  Post Acute Care Choice:    Choice offered to:     DME Arranged:    DME Agency:     HH Arranged:    HH Agency:     Status of Service:  In process, will continue to follow  If discussed at Long Length of Stay Meetings, dates discussed:    Additional Comments:  Pollie Friar, RN 02/21/2018, 11:37 AM

## 2018-02-21 NOTE — Progress Notes (Signed)
Bilateral carotid duplex exam completed. Please see preliminary notes on CV PROC under chart review. Makailah Slavick H Nilza Eaker(RDMS RVT) 02/21/18 2:01 PM

## 2018-02-21 NOTE — Discharge Summary (Signed)
Name: [name] [name] MRN: [id number] DOB: October 12, 1955 62 y.o. PCP: Patient, No Pcp Per  Date of Admission: 02/20/2018 11:13 AM Date of Discharge: 02/22/18 Attending Physician: Dr. Criselda Peaches  Discharge Diagnosis: 1. Possible TIA 2. HTN 3. A. Fib 4. Transaminitis   Discharge Medications: Allergies as of 02/22/2018      Reactions   Codeine Nausea And Vomiting      Medication List    STOP taking these medications   aspirin 325 MG tablet   ibuprofen 200 MG tablet Commonly known as:  ADVIL,MOTRIN     TAKE these medications   acetaminophen 500 MG tablet Commonly known as:  TYLENOL Take 500 mg by mouth every 6 (six) hours as needed for headache.   atorvastatin 40 MG tablet Commonly known as:  LIPITOR Take 1 tablet (40 mg total) by mouth daily.   lisinopril 5 MG tablet Commonly known as:  PRINIVIL,ZESTRIL Take 1 tablet (5 mg total) by mouth daily.   metoprolol succinate 25 MG 24 hr tablet Commonly known as:  TOPROL-XL Take 1 tablet (25 mg total) by mouth daily.   rivaroxaban 20 MG Tabs tablet Commonly known as:  XARELTO Take 1 tablet (20 mg total) by mouth daily with supper.       Disposition and follow-up:   [name] [name] was discharged from [facility] in Good condition.  At the hospital follow up visit please address:  1.  Needs to establish care. We started him on lisinopril, atorvastatin, metoprolol, and xarelto. Will most likely need additional antihypertensives.   2.  Labs / imaging needed at time of follow-up: HCV RNA, PT/OT/SLP evaluation was not done during hospitalization  3.  Pending labs/ test needing follow-up: none  Follow-up Appointments: Follow-up Information    [facility] Follow up.   Why:  please use this location for your pharmacy needs.  Contact information: [address] [place] [address] [place] [address] [phone]       Micki Riley, MD Follow up in 6 week(s).     Specialties:  Neurology, Radiology Why:  Office should call you with an appointment. [phone] Contact information: [address] [place] [address] [phone]           Hospital Course by problem list: 62 year old male with uncontrolled hypertension, former tobacco and alcohol abuse who presents for evaluation of new onset right arm numbness and weakness as well as 1 month of worsening left-sided chest pain.  Symptoms and risk factors concerning for stroke vs. TIA. CT head was unremarkable. 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 CT head showed punctate foci of infarct in left postcentral gyrus. CT neck showed patent carotid and vertebral arteries of neck, no large vessel occlusion or aneurysm, and <50% mixed plaque of left carotid bifurcation, and right vertebral artery and v3 segment with minimal stenosis. TTE showed lv ef 45-50%, no regional wall motion abnormalities, mild to mod dilated left and right atrium. A1c WNLs. Lipid panel below. He was started on moderate intensity statin (atorvastatin 40mg ).   Hypertension was managed with lisinopril 5mg  daily. He will need outpatient follow up.   A fib: Presented in A. Fib RVR with heart rate 115. No respiratory distress. He was rate controlled with metoprolol 25mg  daily and started on Xarelto for a heart score of 2.   Transaminitis: AST/ALT 1:1 ratio. Screening  for Hep B and hiv negative. Hep C ab>11. RUQ ultrasound showed chlolelithiasis without acute cholecystitis or biliary duct dilation, heterogenous hepatic echotexture suspicious for cirrhosis. Will need follow up hep c workup.   Chest Pain: Atypical. Described as sharp, left sided below the rib. Non exertional. Occurs randomly and lasts a few minutes and self resolves. EKG without ischemic changes. Troponins were negative x 2.     Discharge Vitals:   BP (!) 149/91 (BP Location: Right Arm)   Pulse 86   Temp 97.8 F (36.6 C) (Oral)   Resp 20   Ht 6\' 4"  (1.93 m)   Wt 105.1 kg   SpO2 98%   BMI 28.20 kg/m   Pertinent Labs, Studies, and Procedures:  Lipid Panel     Component Value Date/Time   CHOL 133 02/21/2018 0855   TRIG 120 02/21/2018 0855   HDL 25 (L) 02/21/2018 0855   CHOLHDL 5.3 02/21/2018 0855   VLDL 24 02/21/2018 0855   LDLCALC 84 02/21/2018 0855   Hemoglobin A1C: 5.1 TSH 3.08  Troponins negative  CMP     Component Value Date/Time   NA 136 02/21/2018 0641   K 4.4 02/21/2018 0641   CL 106 02/21/2018 0641   CO2 20 (L) 02/21/2018 0641   GLUCOSE 92 02/21/2018 0641   BUN 20 02/21/2018 0641   CREATININE 0.97 02/21/2018 0641   CALCIUM 9.5 02/21/2018 0641   PROT 7.3 02/20/2018 1137   ALBUMIN 3.6 02/20/2018 1137   AST 302 (H) 02/20/2018 1137   ALT 251 (H) 02/20/2018 1137   ALKPHOS 95 02/20/2018 1137   BILITOT 2.0 (H) 02/20/2018 1137   GFRNONAA >60 02/21/2018 0641   GFRAA >60 02/21/2018 0641     CBC    Component Value Date/Time   WBC 5.9 02/21/2018 0641   RBC 4.92 02/21/2018 0641   HGB 15.1 02/21/2018 0641   HCT 46.1 02/21/2018 0641   PLT 102 (L) 02/21/2018 0641   MCV 93.7 02/21/2018 0641   MCH 30.7 02/21/2018 0641   MCHC 32.8 02/21/2018 0641   RDW 14.1 02/21/2018 0641   LYMPHSABS 2.4 02/20/2018 1137   MONOABS 0.7 02/20/2018 1137   EOSABS 0.1 02/20/2018 1137   BASOSABS 0.0 02/20/2018 1137   Urinalysis    Component Value Date/Time   COLORURINE AMBER (A) 02/20/2018 1216   APPEARANCEUR CLEAR 02/20/2018 1216   LABSPEC 1.021 02/20/2018 1216   PHURINE 5.0 02/20/2018 1216   GLUCOSEU NEGATIVE 02/20/2018 1216   HGBUR SMALL (A) 02/20/2018 1216   BILIRUBINUR NEGATIVE 02/20/2018 1216   KETONESUR NEGATIVE 02/20/2018 1216   PROTEINUR NEGATIVE 02/20/2018 1216   NITRITE NEGATIVE 02/20/2018 1216   LEUKOCYTESUR NEGATIVE 02/20/2018 1216     Discharge Instructions: Discharge  Instructions    Ambulatory referral to Neurology   Complete by:  As directed    An appointment is requested in approximately: 6 weeks   Call MD for:  extreme fatigue   Complete by:  As directed    Call MD for:  persistant dizziness or light-headedness   Complete by:  As directed    Diet - low sodium heart healthy   Complete by:  As directed    Increase activity slowly   Complete by:  As directed       Signed: Lorenso Courierhundi, Hallie Ertl, MD 02/23/2018, 9:09 PM   Pager: [phone]

## 2018-02-21 NOTE — Consult Note (Addendum)
Neurology Consultation  Reason for Consult:Stroke Referring Physician: Dr. Petra KubaVogel / Dr. Criselda PeachesMullen  CC: right arm weakness and numbness  History is obtained from:Chart and patient  HPI: [name] [name] is a 62 y.o. male who has a past medical history of hypertension diagnosed many years ago, not in any medication, presented to the hospital for evaluation of right arm numbness and weakness that lasted a few minutes on Wednesday, 02/19/2018. His MRI showed a small area of restricted diffusion in the left postcentral gyrus.  Neurological consultation obtained for stroke management/work-up. He reports that he was taking a shower on Wednesday, when his right arm suddenly gave out.  He also felt it being numb.  He currently has some numbness over the right wrist but all the other symptoms have since resolved.  His symptoms lasted only 15 to 20 minutes.  No speech difficulties.  No headaches.  No visual symptoms.  Reports off-and-on chest pain and dizziness. Reports being a hypochondriac and reading a lot about medical staff and that is why these focal neurological deficits made him come into the hospital.  He was admitted for chest pain evaluation, work-up for results essentially unremarkable. MRI of the brain was done because of the focal neurological deficits and the results are above and also in detail below. Found to have A. fib with RVR -unknown onset.  LKW: Wednesday, 02/19/2018 tpa given?: no, outside the window Premorbid modified Rankin scale (mRS): 0  ROS: ROS was performed and is negative except as noted in the HPI.  Past Medical History:  Diagnosis Date  . Hypertension   . MRSA (methicillin resistant staph aureus) culture positive 2009   Family history Father had a stroke at the age of 63.  Social History:   reports that he quit smoking about 7 months ago. His smoking use included cigarettes. He quit after 53.00 years of use. He has never used smokeless tobacco. He reports that he  drinks alcohol. He reports that he has current or past drug history.   Medications  Current Facility-Administered Medications:  .  acetaminophen (TYLENOL) tablet 650 mg, 650 mg, Oral, Q6H PRN **OR** acetaminophen (TYLENOL) suppository 650 mg, 650 mg, Rectal, Q6H PRN, Chundi, Vahini, MD .  metoprolol succinate (TOPROL-XL) 24 hr tablet 25 mg, 25 mg, Oral, Once, Ali LoweVogel, Marie S, MD .  promethazine (PHENERGAN) tablet 12.5 mg, 12.5 mg, Oral, Q6H PRN, Chundi, Vahini, MD .  rivaroxaban (XARELTO) tablet 20 mg, 20 mg, Oral, Q supper, Norva PavlovRobertson, Crystal S, [place] .  senna-docusate (Senokot-S) tablet 1 tablet, 1 tablet, Oral, QHS PRN, Chundi, Vahini, MD  Exam: Current vital signs: BP (!) 138/25 (BP Location: Right Arm)   Pulse 66   Temp 98.5 F (36.9 C) (Oral)   Resp 18   Ht 6\' 4"  (1.93 m)   Wt 104.3 kg   SpO2 99%   BMI 28.00 kg/m  Vital signs in last 24 hours: Temp:  [97.8 F (36.6 C)-98.6 F (37 C)] 98.5 F (36.9 C) (12/06 1535) Pulse Rate:  [66-104] 66 (12/06 1535) Resp:  [14-20] 18 (12/06 1535) BP: (138-184)/(25-122) 138/25 (12/06 1535) SpO2:  [97 %-100 %] 99 % (12/06 1535)  GENERAL: Awake, alert in NAD HEENT: - Normocephalic and atraumatic, dry mm, no LN++, no Thyromegally LUNGS - Clear to auscultation bilaterally with no wheezes CV - S1S2 RRR, no m/r/g, equal pulses bilaterally. ABDOMEN - Soft, nontender, nondistended with normoactive BS Ext: warm, well perfused, intact peripheral pulses, __ edema  NEURO:  Mental Status: AA&Ox3  Language: speech is clear.  Naming, repetition, fluency, and comprehension intact. Cranial Nerves: PERRL. EOMI, visual fields full, no facial asymmetry, facial sensation intact, hearing intact, tongue/uvula/soft palate midline, normal sternocleidomastoid and trapezius muscle strength. No evidence of tongue atrophy or fibrillations Motor: 5/5 with no vertical drift in all 4s Tone: is normal and bulk is normal Sensation- Intact to light touch bilaterally  (reports numbness in the right wrist but on examination no objective numbness or decreased sensation noted) Coordination: FTN intact bilaterally, no ataxia in BLE. Gait- deferred  NIHSS-0   Labs I have reviewed labs in epic and the results pertinent to this consultation are:  CBC    Component Value Date/Time   WBC 5.9 02/21/2018 0641   RBC 4.92 02/21/2018 0641   HGB 15.1 02/21/2018 0641   HCT 46.1 02/21/2018 0641   PLT 102 (L) 02/21/2018 0641   MCV 93.7 02/21/2018 0641   MCH 30.7 02/21/2018 0641   MCHC 32.8 02/21/2018 0641   RDW 14.1 02/21/2018 0641   LYMPHSABS 2.4 02/20/2018 1137   MONOABS 0.7 02/20/2018 1137   EOSABS 0.1 02/20/2018 1137   BASOSABS 0.0 02/20/2018 1137    CMP     Component Value Date/Time   NA 136 02/21/2018 0641   K 4.4 02/21/2018 0641   CL 106 02/21/2018 0641   CO2 20 (L) 02/21/2018 0641   GLUCOSE 92 02/21/2018 0641   BUN 20 02/21/2018 0641   CREATININE 0.97 02/21/2018 0641   CALCIUM 9.5 02/21/2018 0641   PROT 7.3 02/20/2018 1137   ALBUMIN 3.6 02/20/2018 1137   AST 302 (H) 02/20/2018 1137   ALT 251 (H) 02/20/2018 1137   ALKPHOS 95 02/20/2018 1137   BILITOT 2.0 (H) 02/20/2018 1137   GFRNONAA >60 02/21/2018 0641   GFRAA >60 02/21/2018 0641    Lipid Panel     Component Value Date/Time   CHOL 133 02/21/2018 0855   TRIG 120 02/21/2018 0855   HDL 25 (L) 02/21/2018 0855   CHOLHDL 5.3 02/21/2018 0855   VLDL 24 02/21/2018 0855   LDLCALC 84 02/21/2018 0855  Stroke labs Hemoglobin A1c-5.1 Lipid panel total cholesterol 133, HDL, 25, LDL 84  Echocardiogram shows a left ventricular ejection fraction of 45 to 50%.  Abnormal septal motion mid and basilar inferior wall hypokinesis.  Aortic valve sclerosis without stenosis, calcified annulus of the mitral valve with mild mitral regurgitation, mild to moderate dilatation of left atrium, mild to moderate dilated right atrium, no atrial septal defect or PFO noted.  Imaging I have reviewed the images  obtained: MRI examination of the brain-small area of restricted diffusion in the left post central gyrus.  Assessment:  62 year old man with history of untreated hypertension who presented for evaluation of chest pain and right-sided weakness and numbness, on MRI of the brain is noted to have a small infarct in the left post central gyrus.   New diagnosis of atrial fibrillation-unknown onset.  Etiology of the acute ischemic stroke is small vessel due to hypertension and history of smoking versus embolic due to atrial fibrillation.  Impression: Acute ischemic stroke Hypertension New onset atrial fibrillation  Recommendations: -Telemetry monitoring -Would allow for permissive hypertension for the first 24 to 48 hours but he is passed that window.  Can start normalizing blood pressure to goal of less than 140/90 for now. -CT Angiogram of Head and neck -Frequent neuro checks -Prophylactic therapy-stroke is small, agree with starting oral anti-coagulation (low risk of HT). -Atorvastatin 80 mg PO daily -Risk factor modification -  I discussed the importance of exercise -PT consult, OT consult, Speech consult  Final recommendations to be provided by the stroke team after completion of the head and neck vascular imaging.  Please page stroke NP/PA/MD (listed on AMION)  from 8am-4 pm as this patient will be followed by the stroke team at this point.  -- Milon Dikes, MD [facility] Pager: [phone] If 7pm to 7am, please call on call as listed on AMION.

## 2018-02-21 NOTE — Progress Notes (Signed)
  Echocardiogram 2D Echocardiogram has been performed.  [name] [name] 02/21/2018, 1:40 PM

## 2018-02-22 ENCOUNTER — Inpatient Hospital Stay (HOSPITAL_COMMUNITY): Payer: Self-pay

## 2018-02-22 DIAGNOSIS — I639 Cerebral infarction, unspecified: Secondary | ICD-10-CM

## 2018-02-22 DIAGNOSIS — Z7901 Long term (current) use of anticoagulants: Secondary | ICD-10-CM

## 2018-02-22 DIAGNOSIS — Z7982 Long term (current) use of aspirin: Secondary | ICD-10-CM

## 2018-02-22 DIAGNOSIS — Z79899 Other long term (current) drug therapy: Secondary | ICD-10-CM

## 2018-02-22 DIAGNOSIS — I480 Paroxysmal atrial fibrillation: Secondary | ICD-10-CM

## 2018-02-22 LAB — HEPATITIS B SURFACE ANTIBODY, QUANTITATIVE: Hep B S AB Quant (Post): <3.1 m[IU]/mL — ABNORMAL LOW (ref >9.9)

## 2018-02-22 LAB — GLUCOSE, CAPILLARY: Glucose-Capillary: 79 mg/dL (ref 70–99)

## 2018-02-22 LAB — COMMENT2 - HEP PANEL

## 2018-02-22 LAB — HEPATITIS B SURFACE ANTIGEN: Hepatitis B Surface Ag: NEGATIVE

## 2018-02-22 LAB — HEPATITIS C ANTIBODY (REFLEX): HCV Ab: >11 s/co ratio — ABNORMAL HIGH (ref 0.0–0.9)

## 2018-02-22 LAB — HEPATITIS B CORE ANTIBODY, TOTAL: Hep B Core Total Ab: NEGATIVE

## 2018-02-22 MED ORDER — ATORVASTATIN CALCIUM 40 MG PO TABS: 40.0000 mg | ORAL_TABLET | Freq: Every day | ORAL | 0 refills | Status: DC

## 2018-02-22 NOTE — Progress Notes (Signed)
Discharge information reviewed with patient.  This included, but was not limited to, the following:  Prescriptions, when to call the MD, f/u appointments, symptoms of a stroke, stroke education, activity restrictions, education on Xarelto, atrial fibrillation education and relation to stroke, risk factors of a stroke, etc.  Patient states he will wait for his son to pick him up.  He is dressed and did so independently.

## 2018-02-22 NOTE — Progress Notes (Signed)
Patient requesting information on discharge date and time.  MD notified.

## 2018-02-22 NOTE — Progress Notes (Signed)
Subjective: Patient reports sleeping unusually well last night.  He also states that his right arm numbness has improved to include an even smaller region of his distal forearm then the day prior.   Objective: Vital signs in last 24 hours: Vitals:   02/21/18 1535 02/21/18 1929 02/21/18 2324 02/22/18 0500  BP: (!) 138/25 (!) 137/98 (!) 138/97   Pulse: 66 75 74   Resp: 18 16 16    Temp: 98.5 F (36.9 C) 98 F (36.7 C) 98.7 F (37.1 C)   TempSrc: Oral Oral Oral   SpO2: 99% 99% 99%   Weight:    105.1 kg  Height:        Gen: sitting up in bed. NAD Card: irregularly irregular, no m/r/g Neuro: No focal deficits CN II-XII, 5/5 strength in all extremities, decreased sensation over dorsal right forearm Ext: no LEE, +2, symmetric radial pulses  Studies/Results: Ct Angio Head W Or Wo Contrast  Result Date: 02/21/2018 CLINICAL DATA:  62 y/o M; right arm numbness and weakness. Stroke for follow-up. EXAM: CT ANGIOGRAPHY HEAD AND NECK TECHNIQUE: Multidetector CT imaging of the head and neck was performed using the standard protocol during bolus administration of intravenous contrast. Multiplanar CT image reconstructions and MIPs were obtained to evaluate the vascular anatomy. Carotid stenosis measurements (when applicable) are obtained utilizing NASCET criteria, using the distal internal carotid diameter as the denominator. CONTRAST:  75 cc Isovue 370 COMPARISON:  02/21/2018 MRI head. FINDINGS: CT HEAD FINDINGS Brain: Punctate foci of infarction in the left postcentral gyrus are poorly visualized on CT. No new vascular territory stroke, hemorrhage, mass effect, extra-axial collection, hydrocephalus, or herniation. Nonspecific white matter hypodensities are compatible with mild to moderate chronic microvascular ischemic changes. Vascular: As below. Skull: Normal. Negative for fracture or focal lesion. Sinuses: Imaged portions are clear. Orbits: No acute finding. Review of the MIP images confirms the  above findings CTA NECK FINDINGS Aortic arch: Standard branching. Imaged portion shows no evidence of aneurysm or dissection. No significant stenosis of the major arch vessel origins. Predominantly fibrofatty plaque. Right carotid system: No evidence of dissection, stenosis (50% or greater) or occlusion. Mild non stenotic predominantly fibrofatty plaque of the common carotid artery and carotid bifurcation. Left carotid system: Left common carotid artery origin mild less 50% stenosis with fibrofatty plaque. Left carotid bifurcation mixed plaque with mild less than 50% proximal ICA stenosis. Vertebral arteries: Codominant. No evidence of dissection, stenosis (50% or greater) or occlusion. Right vertebral artery origin and V3 segment mixed plaque with minimal stenosis. Skeleton: Mild cervical spondylosis. No high-grade bony canal stenosis. Other neck: Negative. Upper chest: Negative. Review of the MIP images confirms the above findings CTA HEAD FINDINGS Anterior circulation: No significant stenosis, proximal occlusion, aneurysm, or vascular malformation. Calcific atherosclerosis of the carotid siphons with mild bilateral paraclinoid stenosis. Posterior circulation: No significant stenosis, proximal occlusion, aneurysm, or vascular malformation. Bilateral V4 segment mild non stenotic calcific atherosclerosis. Venous sinuses: As permitted by contrast timing, patent. Anatomic variants: None significant. Delayed phase: No abnormal intracranial enhancement. Review of the MIP images confirms the above findings IMPRESSION: CT head: 1. Punctate foci of infarction in left postcentral gyrus are poorly visualized on CT. 2. No new acute intracranial abnormality identified. 3. Mild to moderate chronic microvascular ischemic changes of the brain. CTA neck: 1. Patent carotid and vertebral arteries of the neck. No large vessel occlusion, aneurysm, or significant stenosis by NASCET criteria is identified. 2. Mixed plaque of left  carotid bifurcation with mild less than 50%  proximal ICA stenosis. 3. Right vertebral artery origin and V3 segment mixed plaque with minimal stenosis. CTA HEAD:. 1. Patent anterior and posterior intracranial circulation. No large vessel occlusion, aneurysm, or significant stenosis is identified. 2. Mild calcific atherosclerosis of carotid and vertebral arteries. Electronically Signed   By: Mitzi Hansen M.D.   On: 02/21/2018 22:10   Dg Chest 2 View  Result Date: 02/20/2018 CLINICAL DATA:  Mid chest pain for 1 month. EXAM: CHEST - 2 VIEW COMPARISON:  None. FINDINGS: Cardiomediastinal silhouette is normal. Mediastinal contours appear intact. 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 There is no evidence of focal airspace consolidation, pleural effusion or pneumothorax. Osseous structures are without acute abnormality. Soft tissues are grossly normal. IMPRESSION: No active cardiopulmonary disease. 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 Electronically Signed   By: Ted Mcalpine M.D.   On: 02/20/2018 12:23   Ct Head Wo Contrast  Result Date: 02/20/2018 CLINICAL DATA:  Headache EXAM: CT HEAD WITHOUT CONTRAST TECHNIQUE: Contiguous axial images were obtained from the base of the skull through the vertex without intravenous contrast. COMPARISON:  None. FINDINGS: Brain: Minimal small vessel ischemic disease periventricular white matter. No intra-axial mass nor extra-axial fluid. No hydrocephalus. Midline fourth ventricle basal cisterns without effacement. No large vascular territory infarct. Vascular: No hyperdense vessel sign. Mild atherosclerosis of the carotid siphons. Skull: Intact Sinuses/Orbits: No acute finding. Other: None. IMPRESSION: Minimal small vessel ischemic disease of periventricular white matter, age indeterminate in the absence of prior studies more likely chronic given vascular calcifications at the skull base. No acute intracranial appearing abnormality.  Electronically Signed   By: Tollie Eth M.D.   On: 02/20/2018 14:36   Ct Angio Neck W Or Wo Contrast  Result Date: 02/21/2018 CLINICAL DATA:  62 y/o M; right arm numbness and weakness. Stroke for follow-up. EXAM: CT ANGIOGRAPHY HEAD AND NECK TECHNIQUE: Multidetector CT imaging of the head and neck was performed using the standard protocol during bolus administration of intravenous contrast. Multiplanar CT image reconstructions and MIPs were obtained to evaluate the vascular anatomy. Carotid stenosis measurements (when applicable) are obtained utilizing NASCET criteria, using the distal internal carotid diameter as the denominator. CONTRAST:  75 cc Isovue 370 COMPARISON:  02/21/2018 MRI head. FINDINGS: CT HEAD FINDINGS Brain: Punctate foci of infarction in the left postcentral gyrus are poorly visualized on CT. No new vascular territory stroke, hemorrhage, mass effect, extra-axial collection, hydrocephalus, or herniation. Nonspecific white matter hypodensities are compatible with mild to moderate chronic microvascular ischemic changes. Vascular: As below. Skull: Normal. Negative for fracture or focal lesion. Sinuses: Imaged portions are clear. Orbits: No acute finding. Review of the MIP images confirms the above findings CTA NECK FINDINGS Aortic arch: Standard branching. Imaged portion shows no evidence of aneurysm or dissection. No significant stenosis of the major arch vessel origins. Predominantly fibrofatty plaque. Right carotid system: No evidence of dissection, stenosis (50% or greater) or occlusion. Mild non stenotic predominantly fibrofatty plaque of the common carotid artery and carotid bifurcation. Left carotid system: Left common carotid artery origin mild less 50% stenosis with fibrofatty plaque. Left carotid bifurcation mixed plaque with mild less than 50% proximal ICA stenosis. Vertebral arteries: Codominant. No evidence of dissection, stenosis (50% or greater) or occlusion. Right vertebral artery  origin and V3 segment mixed plaque with minimal stenosis. Skeleton: Mild cervical spondylosis. No high-grade bony canal stenosis. Other neck: Negative. Upper chest: Negative. Review of the MIP images confirms the above findings CTA HEAD FINDINGS Anterior circulation: No significant stenosis,  proximal occlusion, aneurysm, or vascular malformation. Calcific atherosclerosis of the carotid siphons with mild bilateral paraclinoid stenosis. Posterior circulation: No significant stenosis, proximal occlusion, aneurysm, or vascular malformation. Bilateral V4 segment mild non stenotic calcific atherosclerosis. Venous sinuses: As permitted by contrast timing, patent. Anatomic variants: None significant. Delayed phase: No abnormal intracranial enhancement. Review of the MIP images confirms the above findings IMPRESSION: CT head: 1. Punctate foci of infarction in left postcentral gyrus are poorly visualized on CT. 2. No new acute intracranial abnormality identified. 3. Mild to moderate chronic microvascular ischemic changes of the brain. CTA neck: 1. Patent carotid and vertebral arteries of the neck. No large vessel occlusion, aneurysm, or significant stenosis by NASCET criteria is identified. 2. Mixed plaque of left carotid bifurcation with mild less than 50% proximal ICA stenosis. 3. Right vertebral artery origin and V3 segment mixed plaque with minimal stenosis. CTA HEAD:. 1. Patent anterior and posterior intracranial circulation. No large vessel occlusion, aneurysm, or significant stenosis is identified. 2. Mild calcific atherosclerosis of carotid and vertebral arteries. Electronically Signed   By: Mitzi Hansen M.D.   On: 02/21/2018 22:10   Mr Brain Wo Contrast  Result Date: 02/21/2018 CLINICAL DATA:  Right arm numbness and tingling. Hypertension, smoking history EXAM: MRI HEAD WITHOUT CONTRAST TECHNIQUE: Multiplanar, multiecho pulse sequences of the brain and surrounding structures were obtained without  intravenous contrast. COMPARISON:  CT head 02/20/2018 FINDINGS: Brain: Small area of restricted diffusion in the cortex of the postcentral gyrus compatible with acute infarct. No other acute infarct Multiple white matter hyperintensities bilaterally compatible with chronic microvascular ischemia, moderate lead advanced for age. Ventricle size normal. Negative for hemorrhage or mass. Vascular: Normal arterial flow voids Skull and upper cervical spine: Negative Sinuses/Orbits: Mild mucosal edema paranasal sinuses.  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 Mild to moderate chronic microvascular ischemic changes throughout the white matter Electronically Signed   By: Marlan Palau M.D.   On: 02/21/2018 14:34   Vas US Carotid  Result Date: 02/21/2018 Carotid Arterial Duplex Study Indications:  TIA, Weakness and Right side weakness. Risk Factors: Hypertension. Performing Technologist: Annamaria Helling  Examination Guidelines: A complete evaluation includes B-mode imaging, spectral Doppler, color Doppler, and power Doppler as needed of all accessible portions of each vessel. Bilateral testing is considered an integral part of a complete examination. Limited examinations for reoccurring indications may be performed as noted.  Right Carotid Findings: +----------+--------+-------+--------+----------------------+------------------+           PSV cm/sEDV    StenosisDescribe              Comments                             cm/s                                                    +----------+--------+-------+--------+----------------------+------------------+ CCA Prox  60      14             diffuse and hypoechoic                   +----------+--------+-------+--------+----------------------+------------------+ CCA Mid  diffuse and hypoechoic                    +----------+--------+-------+--------+----------------------+------------------+ CCA Distal41      15                                   intimal thickening +----------+--------+-------+--------+----------------------+------------------+ ICA Prox  31      12     1-39%   diffuse and                                                               hyperechoic                              +----------+--------+-------+--------+----------------------+------------------+ ICA Distal61      28                                                      +----------+--------+-------+--------+----------------------+------------------+ ECA       82      13                                                      +----------+--------+-------+--------+----------------------+------------------+ +----------+--------+-------+--------+-------------------+           PSV cm/sEDV cmsDescribeArm Pressure (mmHG) +----------+--------+-------+--------+-------------------+ [id number]                                         +----------+--------+-------+--------+-------------------+ +---------+--------+--+--------+--+---------+ VertebralPSV cm/s33EDV cm/s12Antegrade +---------+--------+--+--------+--+---------+  Left Carotid Findings: +----------+--------+--------+--------+---------------------+------------------+           PSV cm/sEDV cm/sStenosisDescribe             Comments           +----------+--------+--------+--------+---------------------+------------------+ CCA Prox  76      22                                                      +----------+--------+--------+--------+---------------------+------------------+ CCA Mid                                                intimal thickening +----------+--------+--------+--------+---------------------+------------------+ CCA Distal51      20                                   intimal thickening  +----------+--------+--------+--------+---------------------+------------------+ ICA Prox  32      14      1-39%   diffuse, hyperechoic  and calcific                            +----------+--------+--------+--------+---------------------+------------------+ ICA Mid   81      36                                                      +----------+--------+--------+--------+---------------------+------------------+ ICA Distal72      30                                                      +----------+--------+--------+--------+---------------------+------------------+ ECA       61      15                                                      +----------+--------+--------+--------+---------------------+------------------+ +----------+--------+--------+--------+-------------------+ SubclavianPSV cm/sEDV cm/sDescribeArm Pressure (mmHG) +----------+--------+--------+--------+-------------------+           121                                         +----------+--------+--------+--------+-------------------+ +---------+--------+--+--------+--+---------+ VertebralPSV cm/s30EDV cm/s13Antegrade +---------+--------+--+--------+--+---------+  Summary: Right Carotid: Velocities in the right ICA are consistent with a 1-39% stenosis. Left Carotid: Velocities in the left ICA are consistent with a 1-39% stenosis. Vertebrals: Bilateral vertebral arteries demonstrate antegrade flow. *See table(s) above for measurements and observations.     Preliminary    Medications:  Scheduled Meds: . aspirin EC  81 mg Oral Daily  . atorvastatin  80 mg Oral q1800  . metoprolol succinate  25 mg Oral Once  . rivaroxaban  20 mg Oral Q supper   Continuous Infusions: PRN Meds:.acetaminophen **OR** acetaminophen, promethazine, senna-docusate Assessment/Plan: Active Problems:   TIA (transient ischemic attack)   Paroxysmal atrial fibrillation  (HCC)  62 year old male with uncontrolled hypertension, former tobacco and alcohol abuse who presented for evaluation of new onset right arm numbness and weakness as well as 1 month of worsening left-sided chest pain    Arm numbness: improving. Dopplers convey R and L carotid velocities consistent with <50% stenosis. MR Brain conveyed mild to moderate chronic microvascular ischemic changes throughout white matter and small acute infarct in the left postcentral gyrus causing sensory symptoms.  HgbA1 wnl; with the exception of HDL 25, lipid panel wnl.  Appreciate any additional  recommendations from stroke team.  - Atorvastatin 80 mg PO daily, aspirin 81qd - risk factor modification:  HTN control (<140/90) - f/u outpatient with neuro  2. Chest pain: resolved. EKG reassuring. Troponins negative x 2. Risk factor modification as above.   3. A. Fib: rate controlled on metoprolol 25mg  daily. Continue xarelto.    4. Transaminitis: AST: ALT 1 to 1 ratio more consistent for chronic infection. Hep C antibodies positive - f/u CT AP  5. HTN: lisinopril 5mg  daily    This is a Psychologist, occupational Note.  The care of the patient was discussed with Dr. Rogelia Boga  and the assessment and plan formulated with their assistance.  Please see their attached note for official documentation of the daily encounter.   LOS: 2 days   , Dolores Pattyhika I, Medical Student 02/22/2018, 8:35 AM

## 2018-02-22 NOTE — Plan of Care (Signed)
  Problem: Education: Goal: Knowledge of General Education information will improve Description Including pain rating scale, medication(s)/side effects and non-pharmacologic comfort measures Outcome: Completed/Met   Problem: Health Behavior/Discharge Planning: Goal: Ability to manage health-related needs will improve Outcome: Completed/Met   Problem: Clinical Measurements: Goal: Ability to maintain clinical measurements within normal limits will improve Outcome: Completed/Met Goal: Will remain free from infection Outcome: Completed/Met Goal: Diagnostic test results will improve Outcome: Completed/Met Goal: Respiratory complications will improve Outcome: Completed/Met Goal: Cardiovascular complication will be avoided Outcome: Completed/Met   Problem: Activity: Goal: Risk for activity intolerance will decrease Outcome: Completed/Met   Problem: Nutrition: Goal: Adequate nutrition will be maintained Outcome: Completed/Met   Problem: Coping: Goal: Level of anxiety will decrease Outcome: Completed/Met   Problem: Elimination: Goal: Will not experience complications related to bowel motility Outcome: Completed/Met Goal: Will not experience complications related to urinary retention Outcome: Completed/Met   Problem: Pain Managment: Goal: General experience of comfort will improve Outcome: Completed/Met   Problem: Safety: Goal: Ability to remain free from injury will improve Outcome: Completed/Met   Problem: Skin Integrity: Goal: Risk for impaired skin integrity will decrease Outcome: Completed/Met   Problem: Education: Goal: Knowledge of disease or condition will improve Outcome: Adequate for Discharge Goal: Knowledge of secondary prevention will improve Outcome: Adequate for Discharge Goal: Knowledge of patient specific risk factors addressed and post discharge goals established will improve Outcome: Adequate for Discharge Goal: Individualized Educational  Video(s) Outcome: Adequate for Discharge

## 2018-02-22 NOTE — Progress Notes (Signed)
STROKE TEAM PROGRESS NOTE   HISTORY OF PRESENT ILLNESS (per record) [name] [name] is a 62 y.o. male who has a past medical history of hypertension diagnosed many years ago, not in any medication, presented to the hospital for evaluation of right arm numbness and weakness that lasted a few minutes on Wednesday, 02/19/2018. His MRI showed a small area of restricted diffusion in the left postcentral gyrus.  Neurological consultation obtained for stroke management/work-up. He reports that he was taking a shower on Wednesday, when his right arm suddenly gave out.  He also felt it being numb.  He currently has some numbness over the right wrist but all the other symptoms have since resolved.  His symptoms lasted only 15 to 20 minutes.  No speech difficulties.  No headaches.  No visual symptoms.  Reports off-and-on chest pain and dizziness. Reports being a hypochondriac and reading a lot about medical staff and that is why these focal neurological deficits made him come into the hospital.  He was admitted for chest pain evaluation, work-up for results essentially unremarkable. MRI of the brain was done because of the focal neurological deficits and the results are above and also in detail below. Found to have A. fib with RVR -unknown onset.  LKW: Wednesday, 02/19/2018 tpa given?: no, outside the window Premorbid modified Rankin scale (mRS): 0   SUBJECTIVE (INTERVAL HISTORY) No family members present.Pt feels somewhat improved. Dr Pearlean Brownie expects a full recovery. Discussed anticoagulation for afib.he states that he is not able to afford expensive medications as he has no health insurance and would prefer warfarin over eliquis     OBJECTIVE Vitals:   02/21/18 1535 02/21/18 1929 02/21/18 2324 02/22/18 0500  BP: (!) 138/25 (!) 137/98 (!) 138/97   Pulse: 66 75 74   Resp: 18 16 16    Temp: 98.5 F (36.9 C) 98 F (36.7 C) 98.7 F (37.1 C)   TempSrc: Oral Oral Oral   SpO2: 99% 99% 99%   Weight:     105.1 kg  Height:        CBC:  Recent Labs  Lab 02/20/18 1137 02/21/18 0641  WBC 6.5 5.9  NEUTROABS 3.3  --   HGB 16.1 15.1  HCT 50.5 46.1  MCV 95.5 93.7  PLT 126* 102*    Basic Metabolic Panel:  Recent Labs  Lab 02/20/18 1137 02/21/18 0641  NA 136 136  K 4.2 4.4  CL 106 106  CO2 19* 20*  GLUCOSE 101* 92  BUN 17 20  CREATININE 0.95 0.97  CALCIUM 9.6 9.5    Lipid Panel:     Component Value Date/Time   CHOL 133 02/21/2018 0855   TRIG 120 02/21/2018 0855   HDL 25 (L) 02/21/2018 0855   CHOLHDL 5.3 02/21/2018 0855   VLDL 24 02/21/2018 0855   LDLCALC 84 02/21/2018 0855   HgbA1c:  Lab Results  Component Value Date   HGBA1C 5.1 02/21/2018   Urine Drug Screen:     Component Value Date/Time   LABOPIA NONE DETECTED 02/20/2018 1216   COCAINSCRNUR NONE DETECTED 02/20/2018 1216   COCAINSCRNUR NEGATIVE 12/09/2007 0804   LABBENZ NONE DETECTED 02/20/2018 1216   LABBENZ NEGATIVE 12/09/2007 0804   AMPHETMU NONE DETECTED 02/20/2018 1216   THCU NONE DETECTED 02/20/2018 1216   LABBARB NONE DETECTED 02/20/2018 1216    Alcohol Level     Component Value Date/Time   ETH <10 02/20/2018 1144    IMAGING  Ct Angio Head W Or Wo Contrast Ct  Angio Neck W Or Wo Contrast 02/21/2018 IMPRESSION:   CT head:  1. Punctate foci of infarction in left postcentral gyrus are poorly visualized on CT.  2. No new acute intracranial abnormality identified.  3. Mild to moderate chronic microvascular ischemic changes of the brain.   CTA neck:  1. Patent carotid and vertebral arteries of the neck. No large vessel occlusion, aneurysm, or significant stenosis by NASCET criteria is identified.  2. Mixed plaque of left carotid bifurcation with mild less than 50% proximal ICA stenosis.  3. Right vertebral artery origin and V3 segment mixed plaque with minimal stenosis.   CTA HEAD:.  1. Patent anterior and posterior intracranial circulation. No large vessel occlusion, aneurysm, or significant  stenosis is identified.  2. Mild calcific atherosclerosis of carotid and vertebral arteries.    Dg Chest 2 View 02/20/2018 IMPRESSION:  No active cardiopulmonary disease. 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     Ct Head Wo Contrast 02/20/2018 IMPRESSION:  Minimal small vessel ischemic disease of periventricular white matter, age indeterminate in the absence of prior studies more likely chronic given vascular calcifications at the skull base. No acute intracranial appearing abnormality.     Mr Brain Wo Contrast 02/21/2018 IMPRESSION:  Small acute infarct in the left postcentral gyrus causing sensory symptoms. Mild to moderate chronic microvascular ischemic changes throughout the white matter     Vas US Carotid 02/21/2018 Summary:  Right Carotid: Velocities in the right ICA are consistent with a 1-39% stenosis.  Left Carotid: Velocities in the left ICA are consistent with a 1-39% stenosis. Vertebrals:  Bilateral vertebral arteries demonstrate antegrade flow.      Transthoracic Echocardiogram  02/21/2018 Study Conclusions - Left ventricle: Abnormal septal motion mid and basal inferior   wall hypokinesis The cavity size was normal. Wall thickness was   normal. Systolic function was mildly reduced. The estimated   ejection fraction was in the range of 45% to 50%. Wall motion was   normal; there were no regional wall motion abnormalities. The   study is not technically sufficient to allow evaluation of LV   diastolic function. - Aortic valve: Sclerosis without stenosis. - Mitral valve: Calcified annulus. Mildly thickened leaflets .   There was mild regurgitation. - Left atrium: The atrium was mildly to moderately dilated. - Right atrium: The atrium was mildly to moderately dilated. - Atrial septum: No defect or patent foramen ovale was identified. - Tricuspid valve: There was moderate regurgitation.     PHYSICAL EXAM Blood pressure (!) 138/97, pulse 74,  temperature 98.7 F (37.1 C), temperature source Oral, resp. rate 16, height 6\' 4"  (1.93 m), weight 105.1 kg, SpO2 99 %.  Obese middle-age Caucasian male currently not in distress. . Afebrile. Head is nontraumatic. Neck is supple without bruit.    Cardiac exam no murmur or gallop. Lungs are clear to auscultation. Distal pulses are well felt. Neurological Exam ;  Awake  Alert oriented x 3. Normal speech and language.eye movements full without nystagmus.fundi were not visualized. Vision acuity and fields appear normal. Hearing is normal. Palatal movements are normal. Face symmetric. Tongue midline. Normal strength, tone, reflexes and coordination. Normal sensation. Gait deferred.          ASSESSMENT/PLAN [name] [name] is a 62 y.o. male with history of hypertension presenting with transient right arm numbness and weakness and was found to be in atrial fibrillation.  He did not receive IV t-PA due to improvement in deficits.  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  Resultant - improvement in deficits.  CT head - Punctate foci of infarction in left postcentral gyrus  MRI head - Small acute infarct in the left postcentral gyrus  MRA head - not performed  CTA H&N - Mixed plaque of left carotid bifurcation with mild less than 50% proximal ICA stenosis.  Carotid Doppler - CTA neck performed - carotid dopplers not indicated.  2D Echo  - EF 45 - 50%. No cardiac source of emboli identified.  LDL - 84  HgbA1c - 5.1  UDS - negative  VTE prophylaxis - Xarelto  Diet  - Heart healthy with thin liquids.  ASA 325 mg daily prior to admission, now on Xarelto (rivaroxaban) daily  Patient counseled to be compliant with his antithrombotic medications  Ongoing aggressive stroke risk factor management  Therapy recommendations: (no evaluations ordered)  Disposition:  For discharge today  Hypertension  Stable . Permissive hypertension (OK if  < 220/120) but gradually normalize in 5-7 days . Long-term BP goal normotensive  Hyperlipidemia  Lipid lowering medication PTA:  none  LDL 84, goal < 70  Current lipid lowering medication: Lipitor 80 mg daily  Continue statin at discharge  Other Stroke Risk Factors  Former cigarette smoker - quit  ETOH use, advised to drink no more than 1 alcoholic beverage per day.  Overweight, Body mass index is 28.2 kg/m., recommend weight loss, diet and exercise as appropriate   Atrial fibrillation - newly diagnosed  Other Active Problems  Mild thrombocytopenia  Plan  Agree with Xarelto - does not need asa in addition to Xarelto if no documented CAD.In  case patient cannot afford Xarelto in the long-term may consider switching to warfarin  Could probably decrease Lipitor to 40 mg daily.  F/U with Dr Pearlean BrownieSethi - 6 weeks    Hospital day # 2  Delton Seeavid Rinehuls PA-C [facility] Pager [phone] 02/22/2018, 3:13 PM I have personally obtained history,examined this patient, reviewed notes, independently viewed imaging studies, participated in medical decision making and plan of care.ROS completed by me personally and pertinent positives fully documented  I have made any additions or clarifications directly to the above note. Agree with note above. he presented with hemisensory symptoms secondary to small left parietal cortical embolic infarct from the onset A. Fib. He needs to be on long-term anticoagulation. Agree with Xarelto but if patient is unable to afford due to cost issues may consider switching to warfarin later. Had a long discussion with the patient regarding his stroke risk factors and discuss stroke risk factor reduction in stroke prevention and answered questions. He was also counseled to use his CPAP machine for sleep apnea. Greater than 50% time during this 25 minute visit was spent in counseling and coordination of care about his stroke, discussion about his risk  factors, need for long-term anticoagulation and answered questions  Delia HeadyPramod Naiomy Watters, MD Medical Director [facility] Pager: [phone] 02/22/2018 3:54 PM   To contact Stroke Continuity provider, please refer to [email or web address]. After hours, contact General Neurology

## 2018-02-22 NOTE — Progress Notes (Signed)
Patient discharged to private residence.  Was picked up by son in his vehicle.  Ambulated to exit as per his choice and was escorted by this nurse.

## 2018-03-07 ENCOUNTER — Ambulatory Visit (INDEPENDENT_AMBULATORY_CARE_PROVIDER_SITE_OTHER): Payer: Self-pay | Admitting: Family Medicine

## 2018-03-07 ENCOUNTER — Encounter: Payer: Self-pay | Admitting: Family Medicine

## 2018-03-07 VITALS — BP 156/92 | HR 80 | Temp 98.4°F | Ht 76.0 in | Wt 242.0 lb

## 2018-03-07 DIAGNOSIS — I10 Essential (primary) hypertension: Secondary | ICD-10-CM

## 2018-03-07 DIAGNOSIS — Z09 Encounter for follow-up examination after completed treatment for conditions other than malignant neoplasm: Secondary | ICD-10-CM

## 2018-03-07 DIAGNOSIS — I4891 Unspecified atrial fibrillation: Secondary | ICD-10-CM

## 2018-03-07 DIAGNOSIS — M1990 Unspecified osteoarthritis, unspecified site: Secondary | ICD-10-CM

## 2018-03-07 DIAGNOSIS — Z7689 Persons encountering health services in other specified circumstances: Secondary | ICD-10-CM

## 2018-03-07 DIAGNOSIS — R8290 Unspecified abnormal findings in urine: Secondary | ICD-10-CM

## 2018-03-07 DIAGNOSIS — J45909 Unspecified asthma, uncomplicated: Secondary | ICD-10-CM

## 2018-03-07 DIAGNOSIS — I639 Cerebral infarction, unspecified: Secondary | ICD-10-CM

## 2018-03-07 DIAGNOSIS — M109 Gout, unspecified: Secondary | ICD-10-CM

## 2018-03-07 LAB — POCT URINALYSIS DIP (MANUAL ENTRY)
Bilirubin, UA: NEGATIVE
Glucose, UA: NEGATIVE mg/dL
Ketones, POC UA: NEGATIVE mg/dL
Nitrite, UA: NEGATIVE
Protein Ur, POC: NEGATIVE mg/dL
Spec Grav, UA: 1.015 (ref 1.010–1.025)
Urobilinogen, UA: 0.2 E.U./dL
pH, UA: 7 (ref 5.0–8.0)

## 2018-03-07 MED ORDER — COLCHICINE 0.6 MG PO TABS: 0.6000 mg | ORAL_TABLET | Freq: Every day | ORAL | 2 refills | Status: DC

## 2018-03-07 MED ORDER — CYCLOBENZAPRINE HCL 10 MG PO TABS: 10.0000 mg | ORAL_TABLET | Freq: Three times a day (TID) | ORAL | 2 refills | Status: DC | PRN

## 2018-03-07 MED ORDER — CLONIDINE HCL 0.1 MG PO TABS
0.1000 mg | ORAL_TABLET | Freq: Once | ORAL | Status: AC
  Administered 2018-03-07: 0.1 mg via ORAL

## 2018-03-07 MED ORDER — ALBUTEROL SULFATE HFA 108 (90 BASE) MCG/ACT IN AERS: 2.0000 | INHALATION_SPRAY | Freq: Four times a day (QID) | RESPIRATORY_TRACT | 12 refills | Status: DC | PRN

## 2018-03-07 NOTE — Progress Notes (Signed)
New Patient--Hospital Follow Up  Subjective:    Patient ID: [name] [name], male    DOB: Aug 04, 1955, 62 y.o.   MRN: [id number]   Chief Complaint  Patient presents with  . Establish Care  . Hospitalization Follow-up     Patient had a stroke   HPI  [name] [name] is a 62 year old male with a past medical history of MRSA and Hypertension. He is here today for Hospital Follow Up and to Establish Care.   Current Status: Since his last hospital admission from 02/20/2018-02/22/2018 for acute stroke, with no residual side effects. He states that he is more fatigued since his stroke. He states that he is doing well today with no complaints. He is currently residing with his daughter at this time. He reports occasional cough, and headaches, r/t need for new glasses. He denies chest pain, heart palpitations, and falls. He has occasionally dizziness with position changes. Denies severe headaches, confusion, seizures, double vision, nausea and vomiting. He is taking all medications as directed.   He denies fevers, chills, fatigue, recent infections, weight loss, and night sweats. No reports of GI problems such as nausea, vomiting, diarrhea, and constipation. He has no reports of blood in stools, dysuria and hematuria. No depression or anxiety reported. He denies pain today.   Review of Systems  Constitutional: Negative.   HENT: Negative.   Eyes: Negative.   Respiratory: Negative.   Cardiovascular: Negative.   Gastrointestinal: Positive for abdominal distention.  Endocrine: Negative.   Genitourinary: Negative.   Musculoskeletal: Positive for arthralgias (Generalized arthritic pain) and back pain (chronic ).  Skin: Negative.   Allergic/Immunologic: Negative.   Neurological: Positive for dizziness and headaches.  Hematological: Negative.   Psychiatric/Behavioral: Negative.    Objective:   Physical Exam Vitals signs and nursing note reviewed.  Constitutional:      Appearance: Normal appearance. He  is normal weight.  HENT:     Head: Normocephalic and atraumatic.     Right Ear: Tympanic membrane, ear canal and external ear normal.     Left Ear: Tympanic membrane, ear canal and external ear normal.     Nose: Nose normal.     Mouth/Throat:     Mouth: Mucous membranes are moist.     Pharynx: Oropharynx is clear.  Eyes:     Extraocular Movements: Extraocular movements intact.     Conjunctiva/sclera: Conjunctivae normal.     Pupils: Pupils are equal, round, and reactive to light.  Neck:     Musculoskeletal: Normal range of motion and neck supple.  Cardiovascular:     Rate and Rhythm: Normal rate and regular rhythm.     Pulses: Normal pulses.     Heart sounds: Normal heart sounds.  Pulmonary:     Effort: Pulmonary effort is normal.     Breath sounds: Wheezing (bilateral lower lobes) present.  Abdominal:     General: Abdomen is flat. Bowel sounds are normal.     Palpations: Abdomen is soft.  Musculoskeletal: Normal range of motion.  Skin:    General: Skin is warm and dry.  Neurological:     General: No focal deficit present.     Mental Status: He is alert and oriented to person, place, and time.  Psychiatric:        Mood and Affect: Mood normal.        Behavior: Behavior normal.        Thought Content: Thought content normal.        Judgment: Judgment  normal.    Assessment & Plan:   1. Hospital discharge follow-up  2. Encounter to establish care - POCT urinalysis dipstick  3. Acute ischemic stroke [facility](HCC) He is doing well today.  - Ambulatory referral to Cardiology  4. Hypertension, unspecified type Blood pressures are elevated today. He received Clonidine 0.2 mg today in office to decrease blood pressure. We will increase Lisinopril to 10 mg today. He will continue to decrease high sodium intake, excessive alcohol intake, increase potassium intake, smoking cessation, and increase physical activity of at least 30 minutes of cardio activity daily. He will continue to  follow Heart Healthy or DASH diet. - cloNIDine (CATAPRES) tablet 0.1 mg  5. Atrial fibrillation, unspecified type (HCC) Recently diagnosed with A-fib. He is currently taking Xarelto daily. We will refer him to Cardiology today.  - Ambulatory referral to Cardiology  6. Gout involving toe, unspecified cause, unspecified chronicity, unspecified laterality We will initiate Colchicine today.  - colchicine 0.6 MG tablet; Take 1 tablet (0.6 mg total) by mouth daily.  Dispense: 30 tablet; Refill: 2  7. Arthritis We will initiate Flexeril today.  - cyclobenzaprine (FLEXERIL) 10 MG tablet; Take 1 tablet (10 mg total) by mouth 3 (three) times daily as needed for muscle spasms.  Dispense: 30 tablet; Refill: 2  8. Mild asthma without complication, unspecified whether persistent Bilateral lower lobe wheezes. We will initiate Albuterol today.  - albuterol (PROVENTIL HFA;VENTOLIN HFA) 108 (90 Base) MCG/ACT inhaler; Inhale 2 puffs into the lungs every 6 (six) hours as needed for wheezing or shortness of breath.  Dispense: 1 Inhaler; Refill: 12  9. Abnormal urine Results are pending.  - Urine Culture  10. Follow up He will follow up in 1 month.  Meds ordered this encounter  Medications  . cloNIDine (CATAPRES) tablet 0.1 mg  . cyclobenzaprine (FLEXERIL) 10 MG tablet    Sig: Take 1 tablet (10 mg total) by mouth 3 (three) times daily as needed for muscle spasms.    Dispense:  30 tablet    Refill:  2  . colchicine 0.6 MG tablet    Sig: Take 1 tablet (0.6 mg total) by mouth daily.    Dispense:  30 tablet    Refill:  2  . albuterol (PROVENTIL HFA;VENTOLIN HFA) 108 (90 Base) MCG/ACT inhaler    Sig: Inhale 2 puffs into the lungs every 6 (six) hours as needed for wheezing or shortness of breath.    Dispense:  1 Inhaler    Refill:  12   Raliegh IpNatalie Jamesyn Moorefield,  MSN, [facility] CenterFNP-C Patient [facility] [address]  [place], [place] [id number] [phone]    Referral Orders      Ambulatory referral to Cardiology

## 2018-03-07 NOTE — Patient Instructions (Addendum)
Albuterol inhalation aerosol What is this medicine? ALBUTEROL (al BYOO ter ole) is a bronchodilator. It helps open up the airways in your lungs to make it easier to breathe. This medicine is used to treat and to prevent bronchospasm. This medicine may be used for other purposes; ask your health care provider or pharmacist if you have questions. COMMON BRAND NAME(S): Proair HFA, Proventil, Proventil HFA, Respirol, Ventolin, Ventolin HFA What should I tell my health care provider before I take this medicine? They need to know if you have any of the following conditions: -diabetes -heart disease or irregular heartbeat -high blood pressure -pheochromocytoma -seizures -thyroid disease -an unusual or allergic reaction to albuterol, levalbuterol, sulfites, other medicines, foods, dyes, or preservatives -pregnant or trying to get pregnant -breast-feeding How should I use this medicine? This medicine is for inhalation through the mouth. Follow the directions on your prescription label. Take your medicine at regular intervals. Do not use more often than directed. Make sure that you are using your inhaler correctly. Ask you doctor or health care provider if you have any questions. Talk to your pediatrician regarding the use of this medicine in children. Special care may be needed. Overdosage: If you think you have taken too much of this medicine contact a poison control center or emergency room at once. NOTE: This medicine is only for you. Do not share this medicine with others. What if I miss a dose? If you miss a dose, use it as soon as you can. If it is almost time for your next dose, use only that dose. Do not use double or extra doses. What may interact with this medicine? -anti-infectives like chloroquine and pentamidine -caffeine -cisapride -diuretics -medicines for colds -medicines for depression or for emotional or psychotic conditions -medicines for weight loss including some herbal  products -methadone -some antibiotics like clarithromycin, erythromycin, levofloxacin, and linezolid -some heart medicines -steroid hormones like dexamethasone, cortisone, hydrocortisone -theophylline -thyroid hormones This list may not describe all possible interactions. Give your health care provider a list of all the medicines, herbs, non-prescription drugs, or dietary supplements you use. Also tell them if you smoke, drink alcohol, or use illegal drugs. Some items may interact with your medicine. What should I watch for while using this medicine? Tell your doctor or health care professional if your symptoms do not improve. Do not use extra albuterol. If your asthma or bronchitis gets worse while you are using this medicine, call your doctor right away. If your mouth gets dry try chewing sugarless gum or sucking hard candy. Drink water as directed. What side effects may I notice from receiving this medicine? Side effects that you should report to your doctor or health care professional as soon as possible: -allergic reactions like skin rash, itching or hives, swelling of the face, lips, or tongue -breathing problems -chest pain -feeling faint or lightheaded, falls -high blood pressure -irregular heartbeat -fever -muscle cramps or weakness -pain, tingling, numbness in the hands or feet -vomiting Side effects that usually do not require medical attention (report to your doctor or health care professional if they continue or are bothersome): -cough -difficulty sleeping -headache -nervousness or trembling -stomach upset -stuffy or runny nose -throat irritation -unusual taste This list may not describe all possible side effects. Call your doctor for medical advice about side effects. You may report side effects to FDA at 1-800-FDA-1088. Where should I keep my medicine? Keep out of the reach of children. Store at room temperature between 15 and 30 degrees   C (59 and 86 degrees F). The  contents are under pressure and may burst when exposed to heat or flame. Do not freeze. This medicine does not work as well if it is too cold. Throw away any unused medicine after the expiration date. Inhalers need to be thrown away after the labeled number of puffs have been used or by the expiration date; whichever comes first. Ventolin HFA should be thrown away 12 months after removing from foil pouch. Check the instructions that come with your medicine. NOTE: This sheet is a summary. It may not cover all possible information. If you have questions about this medicine, talk to your doctor, pharmacist, or health care provider.  2019 [employer] Standard (2012-08-21 10:57:17) Cyclobenzaprine tablets What is this medicine? CYCLOBENZAPRINE (sye kloe BEN za preen) is a muscle relaxer. It is used to treat muscle pain, spasms, and stiffness. This medicine may be used for other purposes; ask your health care provider or pharmacist if you have questions. COMMON BRAND NAME(S): Fexmid, Flexeril What should I tell my health care provider before I take this medicine? They need to know if you have any of these conditions: -heart disease, irregular heartbeat, or previous heart attack -liver disease -thyroid problem -an unusual or allergic reaction to cyclobenzaprine, tricyclic antidepressants, lactose, other medicines, foods, dyes, or preservatives -pregnant or trying to get pregnant -breast-feeding How should I use this medicine? Take this medicine by mouth with a glass of water. Follow the directions on the prescription label. If this medicine upsets your stomach, take it with food or milk. Take your medicine at regular intervals. Do not take it more often than directed. Talk to your pediatrician regarding the use of this medicine in children. Special care may be needed. Overdosage: If you think you have taken too much of this medicine contact a poison control center or emergency room at once. NOTE:  This medicine is only for you. Do not share this medicine with others. What if I miss a dose? If you miss a dose, take it as soon as you can. If it is almost time for your next dose, take only that dose. Do not take double or extra doses. What may interact with this medicine? Do not take this medicine with any of the following medications: -MAOIs like Carbex, Eldepryl, Marplan, Nardil, and Parnate This medicine may also interact with the following medications: -alcohol -antihistamines for allergy, cough, and cold -certain medicines for anxiety or sleep -certain medicines for depression like amitriptyline, fluoxetine, sertraline -certain medicines for seizures like phenobarbital, primidone -contrast dyes -local anesthetics like lidocaine, pramoxine, tetracaine -medicines that relax muscles for surgery -narcotic medicines for pain -phenothiazines like chlorpromazine, mesoridazine, prochlorperazine This list may not describe all possible interactions. Give your health care provider a list of all the medicines, herbs, non-prescription drugs, or dietary supplements you use. Also tell them if you smoke, drink alcohol, or use illegal drugs. Some items may interact with your medicine. What should I watch for while using this medicine? Tell your doctor or health care professional if your symptoms do not start to get better or if they get worse. You may get drowsy or dizzy. Do not drive, use machinery, or do anything that needs mental alertness until you know how this medicine affects you. Do not stand or sit up quickly, especially if you are an older patient. This reduces the risk of dizzy or fainting spells. Alcohol may interfere with the effect of this medicine. Avoid alcoholic drinks. If you are taking  another medicine that also causes drowsiness, you may have more side effects. Give your health care provider a list of all medicines you use. Your doctor will tell you how much medicine to take. Do not  take more medicine than directed. Call emergency for help if you have problems breathing or unusual sleepiness. Your mouth may get dry. Chewing sugarless gum or sucking hard candy, and drinking plenty of water may help. Contact your doctor if the problem does not go away or is severe. What side effects may I notice from receiving this medicine? Side effects that you should report to your doctor or health care professional as soon as possible: -allergic reactions like skin rash, itching or hives, swelling of the face, lips, or tongue -breathing problems -chest pain -fast, irregular heartbeat -hallucinations -seizures -unusually weak or tired Side effects that usually do not require medical attention (report to your doctor or health care professional if they continue or are bothersome): -headache -nausea, vomiting This list may not describe all possible side effects. Call your doctor for medical advice about side effects. You may report side effects to FDA at 1-800-FDA-1088. Where should I keep my medicine? Keep out of the reach of children. Store at room temperature between 15 and 30 degrees C (59 and 86 degrees F). Keep container tightly closed. Throw away any unused medicine after the expiration date. NOTE: This sheet is a summary. It may not cover all possible information. If you have questions about this medicine, talk to your doctor, pharmacist, or health care provider.  2019 [employer] Standard (2016-12-26 13:04:35) Colchicine tablets or capsules What is this medicine? COLCHICINE (KOL chi seen) is used to prevent or treat attacks of acute gout or gouty arthritis. This medicine is also used to treat familial Mediterranean fever. This medicine may be used for other purposes; ask your health care provider or pharmacist if you have questions. COMMON BRAND NAME(S): Colcrys, MITIGARE What should I tell my health care provider before I take this medicine? They need to know if you have any  of these conditions: -kidney disease -liver disease -an unusual or allergic reaction to colchicine, other medicines, foods, dyes, or preservatives -pregnant or trying to get pregnant -breast-feeding How should I use this medicine? Take this medicine by mouth with a full glass of water. Follow the directions on the prescription label. You can take it with or without food. If it upsets your stomach, take it with food. Take your medicine at regular intervals. Do not take your medicine more often than directed. A special MedGuide will be given to you by the pharmacist with each prescription and refill. Be sure to read this information carefully each time. Talk to your pediatrician regarding the use of this medicine in children. While this drug may be prescribed for children as young as 63 years old for selected conditions, precautions do apply. Patients over 63 years old may have a stronger reaction and need a smaller dose. Overdosage: If you think you have taken too much of this medicine contact a poison control center or emergency room at once. NOTE: This medicine is only for you. Do not share this medicine with others. What if I miss a dose? If you miss a dose, take it as soon as you can. If it is almost time for your next dose, take only that dose. Do not take double or extra doses. What may interact with this medicine? Do not take this medicine with any of the following medications: -certain antivirals for  HIV or hepatitis This medicine may also interact with the following medications: -certain antibiotics like erythromycin or clarithromycin -certain medicines for blood pressure, heart disease, irregular heart beat -certain medicines for cholesterol like atorvastatin, lovastatin, and simvastatin -certain medicines for fungal infections like ketoconazole, itraconazole, or posaconazole -cyclosporine -grapefruit or grapefruit juice This list may not describe all possible interactions. Give your  health care provider a list of all the medicines, herbs, non-prescription drugs, or dietary supplements you use. Also tell them if you smoke, drink alcohol, or use illegal drugs. Some items may interact with your medicine. What should I watch for while using this medicine? Visit your healthcare professional for regular checks on your progress. Tell your healthcare professional if your symptoms do not start to get better or if they get worse. You should make sure you get enough vitamin B12 while you are taking this medicine. Discuss the foods you eat and the vitamins you take with your healthcare professional. This medicine may increase your risk to bruise or bleed. Call you healthcare professional if you notice any unusual bleeding. What side effects may I notice from receiving this medicine? Side effects that you should report to your doctor or health care professional as soon as possible: -allergic reactions like skin rash, itching or hives, swelling of the face, lips, or tongue -low blood counts - this medicine may decrease the number of white blood cells, red blood cells, and platelets. You may be at increased risk for infections and bleeding -pain, tingling, numbness in the hands or feet -severe diarrhea -signs and symptoms of infection like fever; chills; cough; sore throat; pain or trouble passing urine -signs and symptoms of muscle injury like dark urine; trouble passing urine or change in the amount of urine; unusually weak or tired; muscle pain; back pain -unusual bleeding or bruising -unusually weak or tired -vomiting Side effects that usually do not require medical attention (report these to your doctor or health care professional if they continue or are bothersome): -mild diarrhea -nausea -stomach pain This list may not describe all possible side effects. Call your doctor for medical advice about side effects. You may report side effects to FDA at 1-800-FDA-1088. Where should I keep  my medicine? Keep out of the reach of children. Store at room temperature between 15 and 30 degrees C (59 and 86 degrees F). Keep container tightly closed. Protect from light. Throw away any unused medicine after the expiration date. NOTE: This sheet is a summary. It may not cover all possible information. If you have questions about this medicine, talk to your doctor, pharmacist, or health care provider.  2019 [employer] Standard (2017-05-22 18:45:11)

## 2018-03-08 ENCOUNTER — Other Ambulatory Visit: Payer: Self-pay | Admitting: Family Medicine

## 2018-03-08 DIAGNOSIS — I10 Essential (primary) hypertension: Secondary | ICD-10-CM

## 2018-03-08 MED ORDER — LISINOPRIL 10 MG PO TABS: 10.0000 mg | ORAL_TABLET | Freq: Every day | ORAL | 1 refills | Status: DC

## 2018-03-09 LAB — URINE CULTURE

## 2018-03-13 ENCOUNTER — Other Ambulatory Visit: Payer: Self-pay

## 2018-03-13 ENCOUNTER — Telehealth: Payer: Self-pay

## 2018-03-13 DIAGNOSIS — I10 Essential (primary) hypertension: Secondary | ICD-10-CM

## 2018-03-13 MED ORDER — LISINOPRIL 10 MG PO TABS: 10.0000 mg | ORAL_TABLET | Freq: Every day | ORAL | 1 refills | Status: DC

## 2018-03-13 NOTE — Telephone Encounter (Signed)
-----   Message from Kallie LocksNatalie M Stroud, FNP sent at 03/08/2018 11:01 PM EST ----- Regarding: "New Rx" Please inform patient that New Rx for increased dose of Lisinopril to 10 mg daily was sent to pharmacy today. Please inform patient.

## 2018-03-13 NOTE — Telephone Encounter (Signed)
-----   Message from Natalie M Stroud, FNP sent at 03/08/2018 11:01 PM EST ----- Regarding: "New Rx" Please inform patient that New Rx for increased dose of Lisinopril to 10 mg daily was sent to pharmacy today. Please inform patient.  

## 2018-03-13 NOTE — Telephone Encounter (Signed)
Patient notified

## 2018-03-13 NOTE — Telephone Encounter (Signed)
Left a vm for patient to [facility]ak

## 2018-04-03 ENCOUNTER — Encounter: Payer: Self-pay | Admitting: Cardiology

## 2018-04-07 ENCOUNTER — Encounter: Payer: Self-pay | Admitting: Family Medicine

## 2018-04-07 ENCOUNTER — Ambulatory Visit (INDEPENDENT_AMBULATORY_CARE_PROVIDER_SITE_OTHER): Payer: Self-pay | Admitting: Family Medicine

## 2018-04-07 VITALS — BP 144/92 | HR 78 | Temp 97.4°F | Ht 76.0 in | Wt 239.0 lb

## 2018-04-07 DIAGNOSIS — J45909 Unspecified asthma, uncomplicated: Secondary | ICD-10-CM

## 2018-04-07 DIAGNOSIS — M1990 Unspecified osteoarthritis, unspecified site: Secondary | ICD-10-CM

## 2018-04-07 DIAGNOSIS — Z09 Encounter for follow-up examination after completed treatment for conditions other than malignant neoplasm: Secondary | ICD-10-CM

## 2018-04-07 DIAGNOSIS — I10 Essential (primary) hypertension: Secondary | ICD-10-CM | POA: Insufficient documentation

## 2018-04-07 DIAGNOSIS — Z8673 Personal history of transient ischemic attack (TIA), and cerebral infarction without residual deficits: Secondary | ICD-10-CM

## 2018-04-07 DIAGNOSIS — M109 Gout, unspecified: Secondary | ICD-10-CM

## 2018-04-07 LAB — POCT URINALYSIS DIP (MANUAL ENTRY)
Bilirubin, UA: NEGATIVE
Blood, UA: NEGATIVE
Glucose, UA: NEGATIVE mg/dL
Ketones, POC UA: NEGATIVE mg/dL
Leukocytes, UA: NEGATIVE
Nitrite, UA: NEGATIVE
Protein Ur, POC: NEGATIVE mg/dL
Spec Grav, UA: 1.025 (ref 1.010–1.025)
Urobilinogen, UA: 1 E.U./dL
pH, UA: 5.5 (ref 5.0–8.0)

## 2018-04-07 MED ORDER — ATORVASTATIN CALCIUM 40 MG PO TABS: 40.0000 mg | ORAL_TABLET | Freq: Every day | ORAL | 0 refills | Status: DC

## 2018-04-07 MED ORDER — RIVAROXABAN 20 MG PO TABS: 20.0000 mg | ORAL_TABLET | Freq: Every day | ORAL | 2 refills | Status: DC

## 2018-04-07 MED ORDER — METOPROLOL SUCCINATE ER 25 MG PO TB24: 25.0000 mg | ORAL_TABLET | Freq: Every day | ORAL | 2 refills | Status: DC

## 2018-04-07 MED ORDER — COLCHICINE 0.6 MG PO TABS: 0.6000 mg | ORAL_TABLET | Freq: Every day | ORAL | 2 refills | Status: DC

## 2018-04-07 MED ORDER — ALBUTEROL SULFATE HFA 108 (90 BASE) MCG/ACT IN AERS: 2.0000 | INHALATION_SPRAY | Freq: Four times a day (QID) | RESPIRATORY_TRACT | 12 refills | Status: DC | PRN

## 2018-04-07 MED ORDER — CYCLOBENZAPRINE HCL 10 MG PO TABS: 10.0000 mg | ORAL_TABLET | Freq: Three times a day (TID) | ORAL | 2 refills | Status: DC | PRN

## 2018-04-07 MED ORDER — LISINOPRIL 10 MG PO TABS: 10.0000 mg | ORAL_TABLET | Freq: Every day | ORAL | 1 refills | Status: DC

## 2018-04-07 MED FILL — XARELTO 20 MG TABLET: 20 | 30 days supply | Qty: 30 | Fill #0

## 2018-04-07 MED FILL — METOPROLOL SUCCINATE ER 25: 25 | 30 days supply | Qty: 30 | Fill #0

## 2018-04-07 MED FILL — ATORVASTATIN CALCIUM 40 MG: 40 | 30 days supply | Qty: 30 | Fill #0

## 2018-04-07 MED FILL — LISINOPRIL 10 MG TABS: 10 | 30 days supply | Qty: 30 | Fill #0

## 2018-04-07 MED FILL — !COLCRYS 0.6 MG TABLET: 0.6 MG | 30 days supply | Qty: 30 | Fill #0

## 2018-04-07 MED FILL — !VENTOLIN HFA INHALER: 108 (90 BAS | 25 days supply | Qty: 18 | Fill #0

## 2018-04-07 MED FILL — CYCLOBENZAPRINE 10 MG TAB: 10 | 10 days supply | Qty: 30 | Fill #0

## 2018-04-07 NOTE — Progress Notes (Signed)
Established Patient Office Visit  Subjective:  Patient ID: [name] [name], male    DOB: [birth year]  Age: 63 y.o. MRN: [id number]  CC:  Chief Complaint  Patient presents with  . Follow-up    chronic condition     HPI [name] [name] is a 63 year old male who presents today for follow up of chronic disease. He has a past medical history of: Past Medical History:  Diagnosis Date  . A-fib (HCC)   . Acute ischemic stroke (HCC)   . Asthma   . Gout   . Hypertension   . MRSA (methicillin resistant staph aureus) culture positive 2009     Current Status: Since his last office visit, he is doing well with no complaints. He states that all his medications were too expensive for him to afford, so he has not picked up any of his medications. He has been taking daily aspirin. He denies visual changes, chest pain, cough, shortness of breath, heart palpitations, and falls. He has occasionally headaches and dizziness with position changes. Denies severe headaches, confusion, seizures, double vision, and blurred vision, nausea and vomiting.  He denies fevers, chills, fatigue, recent infections, weight loss, and night sweats. He has not had any falls. No chest pain, heart palpitations, cough and shortness of breath reported. No reports of GI problems such as diarrhea, and constipation. He has no reports of blood in stools, dysuria and hematuria. No depression or anxiety reported. He denies pain today.   Past Medical History:  Diagnosis Date  . A-fib (HCC)   . Acute ischemic stroke (HCC)   . Asthma   . Gout   . Hypertension   . MRSA (methicillin resistant staph aureus) culture positive 2009    No past surgical history on file.  Family History  Problem Relation Age of Onset  . Heart disease Mother   . Hypertension Father     Social History   Socioeconomic History  . Marital status: Married    Spouse name: Not on file  . Number of children: Not on file  . Years of education: Not  on file  . Highest education level: Not on file  Occupational History  . Not on file  Social Needs  . Financial resource strain: Not on file  . Food insecurity:    Worry: Not on file    Inability: Not on file  . Transportation needs:    Medical: Not on file    Non-medical: Not on file  Tobacco Use  . Smoking status: Former Smoker    Years: 53.00    Types: Cigarettes    Last attempt to quit: 07/21/2017    Years since quitting: 0.7  . Smokeless tobacco: Never Used  Substance and Sexual Activity  . Alcohol use: Yes    Comment: occ  . Drug use: Not Currently  . Sexual activity: Not on file  Lifestyle  . Physical activity:    Days per week: Not on file    Minutes per session: Not on file  . Stress: Not on file  Relationships  . Social connections:    Talks on phone: Not on file    Gets together: Not on file    Attends religious service: Not on file    Active member of club or organization: Not on file    Attends meetings of clubs or organizations: Not on file    Relationship status: Not on file  . Intimate partner violence:    Fear of  current or ex partner: Not on file    Emotionally abused: Not on file    Physically abused: Not on file    Forced sexual activity: Not on file  Other Topics Concern  . Not on file  Social History Narrative  . Not on file    Outpatient Medications Prior to Visit  Medication Sig Dispense Refill  . acetaminophen (TYLENOL) 500 MG tablet Take 500 mg by mouth every 6 (six) hours as needed for headache.    . albuterol (PROVENTIL HFA;VENTOLIN HFA) 108 (90 Base) MCG/ACT inhaler Inhale 2 puffs into the lungs every 6 (six) hours as needed for wheezing or shortness of breath. 1 Inhaler 12  . atorvastatin (LIPITOR) 40 MG tablet Take 1 tablet (40 mg total) by mouth daily. 90 tablet 0  . colchicine 0.6 MG tablet Take 1 tablet (0.6 mg total) by mouth daily. 30 tablet 2  . cyclobenzaprine (FLEXERIL) 10 MG tablet Take 1 tablet (10 mg total) by mouth 3  (three) times daily as needed for muscle spasms. 30 tablet 2  . lisinopril (PRINIVIL,ZESTRIL) 10 MG tablet Take 1 tablet (10 mg total) by mouth daily. 90 tablet 1  . metoprolol succinate (TOPROL-XL) 25 MG 24 hr tablet Take 1 tablet (25 mg total) by mouth daily. 30 tablet 2  . rivaroxaban (XARELTO) 20 MG TABS tablet Take 1 tablet (20 mg total) by mouth daily with supper. 30 tablet 2   No facility-administered medications prior to visit.     Allergies  Allergen Reactions  . Codeine Nausea And Vomiting    ROS Review of Systems  Constitutional: Negative.   HENT: Negative.   Eyes: Negative.   Respiratory: Negative.   Cardiovascular: Negative.   Gastrointestinal: Negative.   Endocrine: Negative.   Genitourinary: Negative.   Musculoskeletal: Negative.   Skin: Negative.   Neurological: Positive for dizziness and headaches.  Hematological: Negative.   Psychiatric/Behavioral: Negative.     Objective:   Physical Exam  Constitutional: He appears well-developed.  HENT:  Head: Normocephalic and atraumatic.  Neck: Normal range of motion. Neck supple.  Cardiovascular: Normal rate.  Pulmonary/Chest: Effort normal and breath sounds normal.  Abdominal: Soft. Bowel sounds are normal.  Skin: Skin is warm and dry.    BP (!) 144/92 (BP Location: Left Arm, Patient Position: Sitting, Cuff Size: Large)   Pulse 78   Temp (!) 97.4 F (36.3 C) (Oral)   Ht 6\' 4"  (1.93 m)   Wt 239 lb (108.4 kg)   SpO2 99%   BMI 29.09 kg/m  Wt Readings from Last 3 Encounters:  04/07/18 239 lb (108.4 kg)  03/07/18 242 lb (109.8 kg)  02/22/18 231 lb 11.3 oz (105.1 kg)     Health Maintenance Due  Topic Date Due  . TETANUS/TDAP  08/27/1973  . COLONOSCOPY  08/27/2004  . INFLUENZA VACCINE  10/17/2017    There are no preventive care reminders to display for this patient.  Lab Results  Component Value Date   TSH 3.086 02/20/2018   Lab Results  Component Value Date   WBC 5.9 02/21/2018   HGB 15.1  02/21/2018   HCT 46.1 02/21/2018   MCV 93.7 02/21/2018   PLT 102 (L) 02/21/2018   Lab Results  Component Value Date   NA 136 02/21/2018   K 4.4 02/21/2018   CO2 20 (L) 02/21/2018   GLUCOSE 92 02/21/2018   BUN 20 02/21/2018   CREATININE 0.97 02/21/2018   BILITOT 2.0 (H) 02/20/2018   ALKPHOS 95 02/20/2018  AST 302 (H) 02/20/2018   ALT 251 (H) 02/20/2018   PROT 7.3 02/20/2018   ALBUMIN 3.6 02/20/2018   CALCIUM 9.5 02/21/2018   ANIONGAP 10 02/21/2018   Lab Results  Component Value Date   CHOL 133 02/21/2018   Lab Results  Component Value Date   HDL 25 (L) 02/21/2018   Lab Results  Component Value Date   LDLCALC 84 02/21/2018   Lab Results  Component Value Date   TRIG 120 02/21/2018   Lab Results  Component Value Date   CHOLHDL 5.3 02/21/2018   Lab Results  Component Value Date   HGBA1C 5.1 02/21/2018   Assessment & Plan:   1. Hypertension, unspecified type Blood pressure is stable at 144/92 today. He has not been able to pick up refill of Lisinopril. He will go to [facility] today to get assistance with refilling all medications.  - POCT urinalysis dipstick - lisinopril (PRINIVIL,ZESTRIL) 10 MG tablet; Take 1 tablet (10 mg total) by mouth daily.  Dispense: 90 tablet; Refill: 1 - metoprolol succinate (TOPROL-XL) 25 MG 24 hr tablet; Take 1 tablet (25 mg total) by mouth daily.  Dispense: 30 tablet; Refill: 2  2. History of ischemic stroke without residual deficits He is doing well. He has been taking aspirin daily, until he is  able to get prescription refilled for Xarelto.  - rivaroxaban (XARELTO) 20 MG TABS tablet; Take 1 tablet (20 mg total) by mouth daily with supper.  Dispense: 30 tablet; Refill: 2  3. Mild asthma without complication, unspecified whether persistent Stable. No signs or symptoms of respiratory distress noted or reported.  - albuterol (PROVENTIL HFA;VENTOLIN HFA) 108 (90 Base) MCG/ACT inhaler; Inhale 2 puffs into the lungs  every 6 (six) hours as needed for wheezing or shortness of breath.  Dispense: 1 Inhaler; Refill: 12  4. Gout involving toe, unspecified cause, unspecified chronicity, unspecified laterality - colchicine 0.6 MG tablet; Take 1 tablet (0.6 mg total) by mouth daily.  Dispense: 30 tablet; Refill: 2  5. Arthritis - cyclobenzaprine (FLEXERIL) 10 MG tablet; Take 1 tablet (10 mg total) by mouth 3 (three) times daily as needed for muscle spasms.  Dispense: 30 tablet; Refill: 2  6. Follow up He will follow up in 1 months.   [name] Ip,  MSN, FNP-C [facility] [address] [place], [place] [address] [phone]   Problem List Items Addressed This Visit      Cardiovascular and Mediastinum   Acute ischemic stroke [facility])   Relevant Medications   atorvastatin (LIPITOR) 40 MG tablet   lisinopril (PRINIVIL,ZESTRIL) 10 MG tablet   metoprolol succinate (TOPROL-XL) 25 MG 24 hr tablet   rivaroxaban (XARELTO) 20 MG TABS tablet    Other Visit Diagnoses    Hypertension, unspecified type    -  Primary   Relevant Medications   atorvastatin (LIPITOR) 40 MG tablet   lisinopril (PRINIVIL,ZESTRIL) 10 MG tablet   metoprolol succinate (TOPROL-XL) 25 MG 24 hr tablet   rivaroxaban (XARELTO) 20 MG TABS tablet   Other Relevant Orders   POCT urinalysis dipstick (Completed)   Mild asthma without complication, unspecified whether persistent       Relevant Medications   albuterol (PROVENTIL HFA;VENTOLIN HFA) 108 (90 Base) MCG/ACT inhaler   Gout involving toe, unspecified cause, unspecified chronicity, unspecified laterality       Relevant Medications   colchicine 0.6 MG tablet   cyclobenzaprine (FLEXERIL) 10 MG tablet   Arthritis  Relevant Medications   colchicine 0.6 MG tablet   cyclobenzaprine (FLEXERIL) 10 MG tablet   Follow up          Meds ordered this encounter  Medications  . albuterol (PROVENTIL HFA;VENTOLIN HFA) 108 (90 Base) MCG/ACT inhaler     Sig: Inhale 2 puffs into the lungs every 6 (six) hours as needed for wheezing or shortness of breath.    Dispense:  1 Inhaler    Refill:  12  . atorvastatin (LIPITOR) 40 MG tablet    Sig: Take 1 tablet (40 mg total) by mouth daily.    Dispense:  90 tablet    Refill:  0  . colchicine 0.6 MG tablet    Sig: Take 1 tablet (0.6 mg total) by mouth daily.    Dispense:  30 tablet    Refill:  2  . cyclobenzaprine (FLEXERIL) 10 MG tablet    Sig: Take 1 tablet (10 mg total) by mouth 3 (three) times daily as needed for muscle spasms.    Dispense:  30 tablet    Refill:  2  . lisinopril (PRINIVIL,ZESTRIL) 10 MG tablet    Sig: Take 1 tablet (10 mg total) by mouth daily.    Dispense:  90 tablet    Refill:  1  . metoprolol succinate (TOPROL-XL) 25 MG 24 hr tablet    Sig: Take 1 tablet (25 mg total) by mouth daily.    Dispense:  30 tablet    Refill:  2  . rivaroxaban (XARELTO) 20 MG TABS tablet    Sig: Take 1 tablet (20 mg total) by mouth daily with supper.    Dispense:  30 tablet    Refill:  2    Follow-up: Return in about 1 month (around 05/08/2018).    [name] LocksNatalie M Ghada Abbett, FNP

## 2018-04-17 ENCOUNTER — Ambulatory Visit: Payer: Self-pay | Admitting: Neurology

## 2018-04-17 NOTE — Progress Notes (Deleted)
Cardiology Office Note   Date:  04/17/2018   ID:  [name] RailGregory A [place], DOB April 01, 1955, MRN [id number]  PCP:  Kallie LocksStroud, Natalie M, FNP  Cardiologist:    [place], MD   No chief complaint on file.     History of Present Illness: [name] [name] is a 63 y.o. male who is seen at the request of Raliegh Ipatalie Stroud FNP for evaluation of embolic CVA and Atrial fibrillation. He has a history of poorly controlled HTN.  He was admitted 12/5-12/7/20 with right sided weakness/ numbness. He was in Afib with rate 115. CT head was unremarkable. 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 CT head showed punctate foci of infarct in left postcentral gyrus. CT neck showed patent carotid and vertebral arteries of neck, no large vessel occlusion or aneurysm, and <50% mixed plaque of left carotid bifurcation, and right vertebral artery and v3 segment with minimal stenosis. TTE showed lv ef 45-50%, no regional wall motion abnormalities, mild to mod dilated left and right atrium. A1c WNLs. Lipid panel below. He was started on moderate intensity statin (atorvastatin 40mg ).  He was started on metoprolol for rate control and Xarelto for anticoagulation.     Past Medical History:  Diagnosis Date  . A-fib (HCC)   . Acute ischemic stroke (HCC)   . Asthma   . Gout   . Hypertension   . MRSA (methicillin resistant staph aureus) culture positive 2009    No past surgical history on file.   Current Outpatient Medications  Medication Sig Dispense Refill  . acetaminophen (TYLENOL) 500 MG tablet Take 500 mg by mouth every 6 (six) hours as needed for headache.    . albuterol (PROVENTIL HFA;VENTOLIN HFA) 108 (90 Base) MCG/ACT inhaler Inhale 2 puffs into the lungs every 6 (six) hours as needed for wheezing or shortness of breath. 1 Inhaler 12  . atorvastatin (LIPITOR) 40 MG tablet Take 1 tablet (40 mg  total) by mouth daily. 90 tablet 0  . colchicine 0.6 MG tablet Take 1 tablet (0.6 mg total) by mouth daily. 30 tablet 2  . cyclobenzaprine (FLEXERIL) 10 MG tablet Take 1 tablet (10 mg total) by mouth 3 (three) times daily as needed for muscle spasms. 30 tablet 2  . lisinopril (PRINIVIL,ZESTRIL) 10 MG tablet Take 1 tablet (10 mg total) by mouth daily. 90 tablet 1  . metoprolol succinate (TOPROL-XL) 25 MG 24 hr tablet Take 1 tablet (25 mg total) by mouth daily. 30 tablet 2  . rivaroxaban (XARELTO) 20 MG TABS tablet Take 1 tablet (20 mg total) by mouth daily with supper. 30 tablet 2   No current facility-administered medications for this visit.     Allergies:   Codeine    Social History:  The patient  reports that he quit smoking about 8 months ago. His smoking use included cigarettes. He quit after 53.00 years of use. He has never used smokeless tobacco. He reports current alcohol use. He reports previous drug use.   Family History:  The patient's ***family history includes Heart disease in his mother; Hypertension in his father.    ROS:  Please see the history of present illness.   Otherwise, review of systems are positive for {NONE DEFAULTED:18576::"none"}.   All other systems are reviewed and negative.    PHYSICAL EXAM: VS:  There were no vitals taken for this visit. , BMI There is no height or  weight on file to calculate BMI. GEN: Well nourished, well developed, in no acute distress  HEENT: normal  Neck: no JVD, carotid bruits, or masses Cardiac: ***RRR; no murmurs, rubs, or gallops,no edema  Respiratory:  clear to auscultation bilaterally, normal work of breathing GI: soft, nontender, nondistended, + BS MS: no deformity or atrophy  Skin: warm and dry, no rash Neuro:  Strength and sensation are intact Psych: euthymic mood, full affect   EKG:  EKG {ACTION; IS/IS UMP:53614431} ordered today. The ekg ordered today demonstrates ***   Recent Labs: 02/20/2018: ALT 251; TSH  3.086 02/21/2018: BUN 20; Creatinine, Ser 0.97; Hemoglobin 15.1; Platelets 102; Potassium 4.4; Sodium 136    Lipid Panel    Component Value Date/Time   CHOL 133 02/21/2018 0855   TRIG 120 02/21/2018 0855   HDL 25 (L) 02/21/2018 0855   CHOLHDL 5.3 02/21/2018 0855   VLDL 24 02/21/2018 0855   LDLCALC 84 02/21/2018 0855      Wt Readings from Last 3 Encounters:  04/07/18 239 lb (108.4 kg)  03/07/18 242 lb (109.8 kg)  02/22/18 231 lb 11.3 oz (105.1 kg)      Other studies Reviewed: Additional studies/ records that were reviewed today include:   Echo 02/21/18: Study Conclusions  - Left ventricle: Abnormal septal motion mid and basal inferior   wall hypokinesis The cavity size was normal. Wall thickness was   normal. Systolic function was mildly reduced. The estimated   ejection fraction was in the range of 45% to 50%. Wall motion was   normal; there were no regional wall motion abnormalities. The   study is not technically sufficient to allow evaluation of LV   diastolic function. - Aortic valve: Sclerosis without stenosis. - Mitral valve: Calcified annulus. Mildly thickened leaflets .   There was mild regurgitation. - Left atrium: The atrium was mildly to moderately dilated. - Right atrium: The atrium was mildly to moderately dilated. - Atrial septum: No defect or patent foramen ovale was identified. - Tricuspid valve: There was moderate regurgitation.   ASSESSMENT AND PLAN:  1.  Atrial fibrillation  2. CVA involving the left postcentral gyrus  3. HTN   Current medicines are reviewed at length with the patient today.  The patient {ACTIONS; HAS/DOES NOT HAVE:19233} concerns regarding medicines.  The following changes have been made:  {PLAN; NO CHANGE:13088:s}  Labs/ tests ordered today include: *** No orders of the defined types were placed in this encounter.    Disposition:   FU with *** in {gen number 5-40:086761} {Days to years:10300}  Signed,  [place],  MD  04/17/2018 1:28 PM    [facility] [address], [place], [place], [address] Phone [phone], Fax [phone]

## 2018-04-21 ENCOUNTER — Ambulatory Visit: Payer: Self-pay | Admitting: Cardiology

## 2018-05-09 ENCOUNTER — Ambulatory Visit: Payer: Self-pay | Admitting: Family Medicine

## 2018-12-14 IMAGING — US US ABDOMEN LIMITED
1 series · 14 of 25 positions shown · non-contrast
Comparison: None

CLINICAL DATA: Elevated liver function tests.  Nausea for 3 weeks.

EXAM:
ULTRASOUND ABDOMEN LIMITED RIGHT UPPER QUADRANT

[Series 1: us abdomen limited · 0.25mm/px · 14 of 34 slices shown]
[im 1/34]
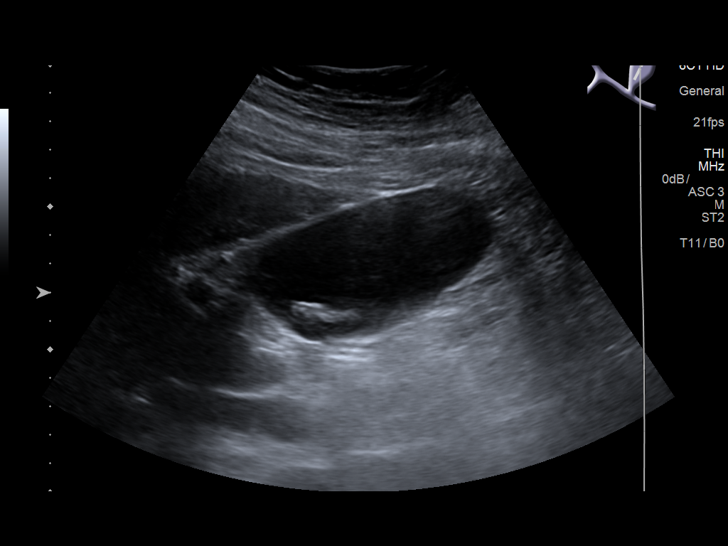
[im 3/34]
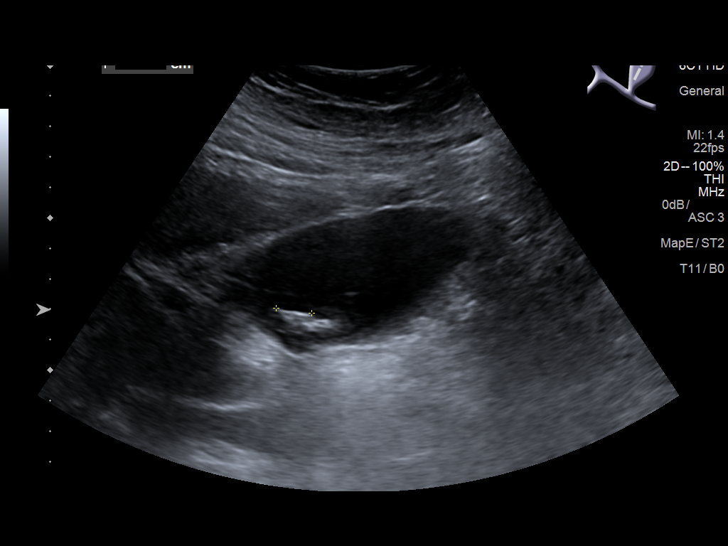
[im 6/34]
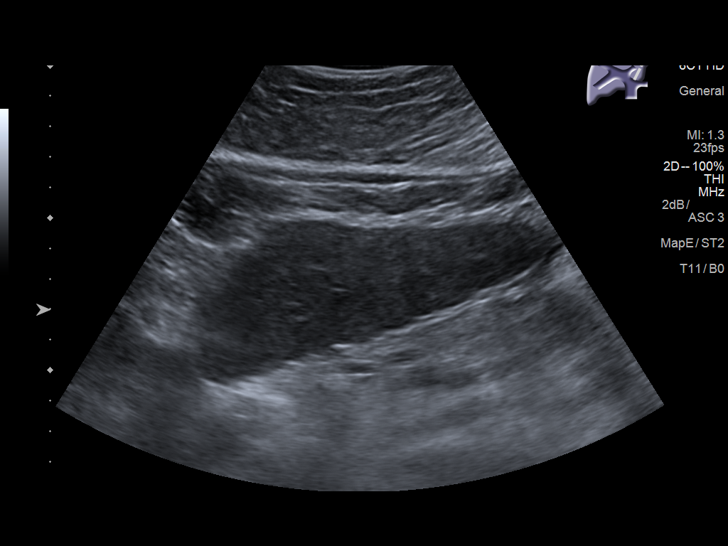
[im 9/34]
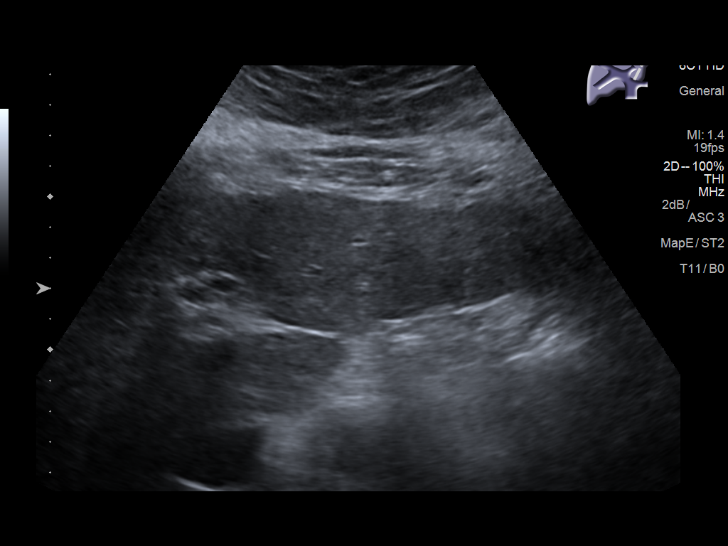
[im 12/34]
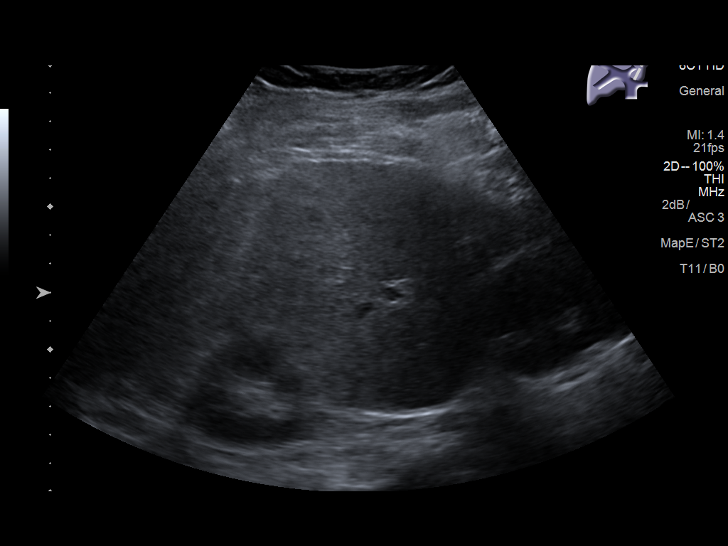
[im 13/34]
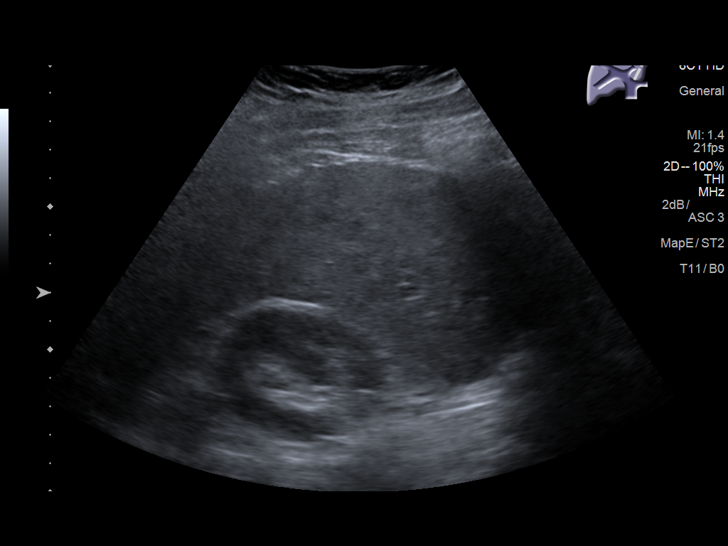
[im 16/34]
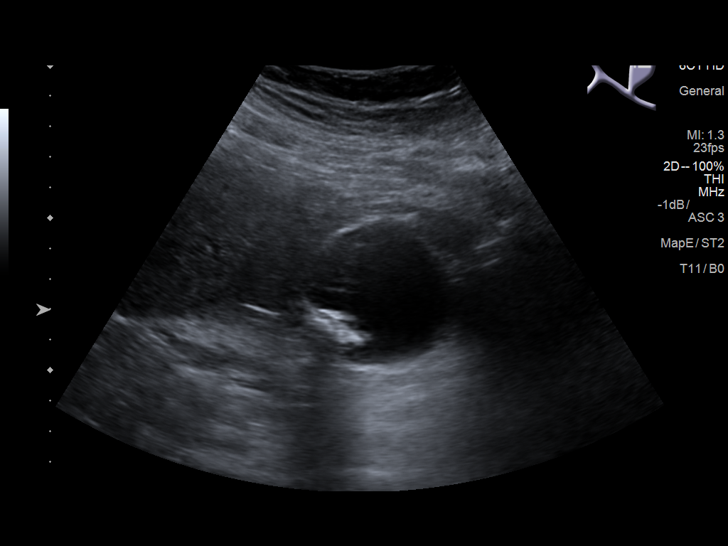
[im 18/34]
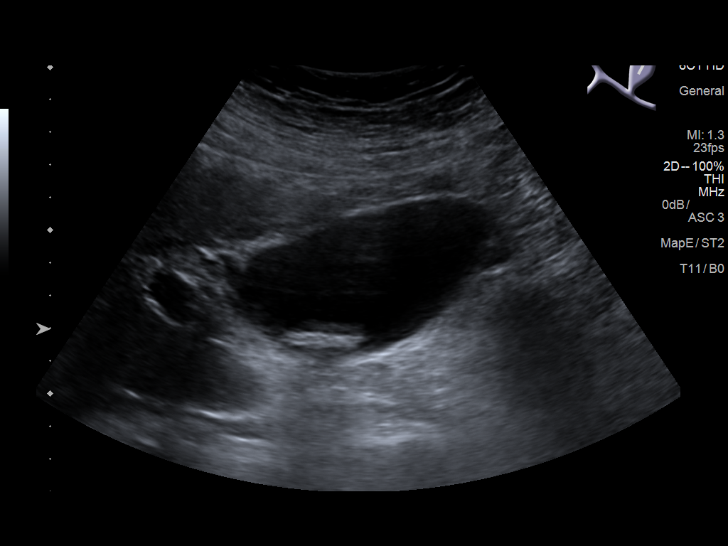
[im 21/34]
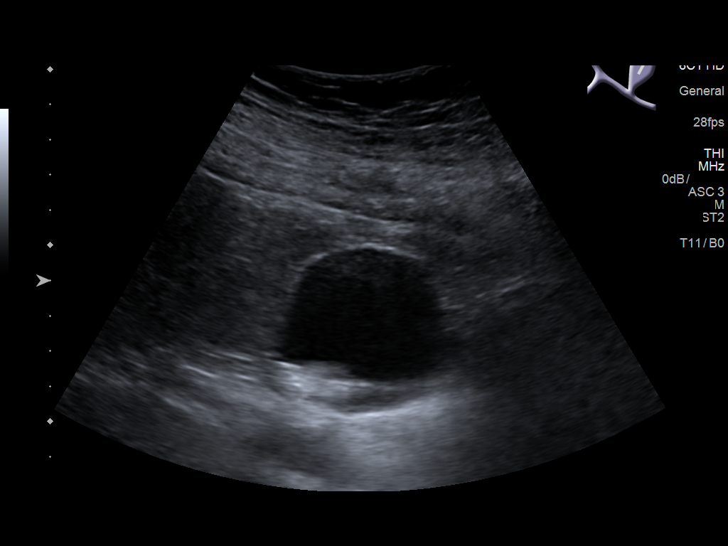
[im 23/34]
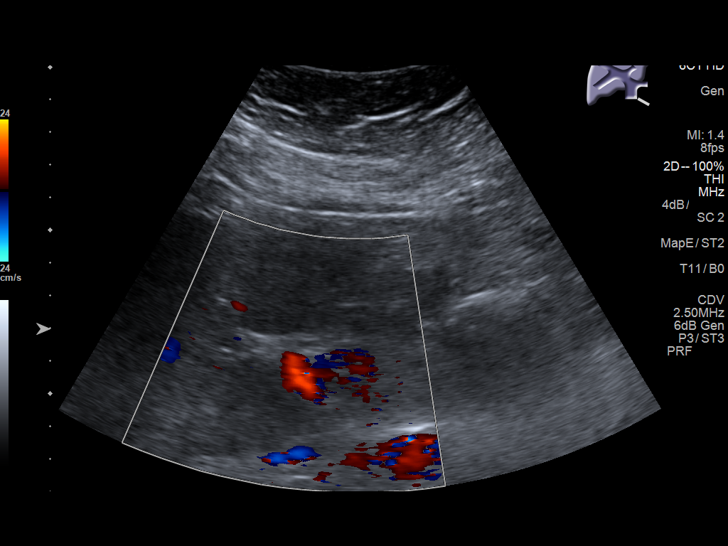
[im 25/34]
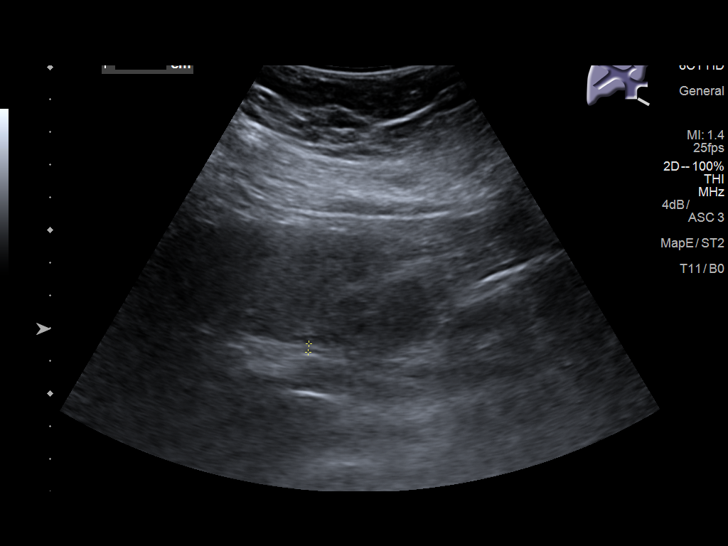
[im 28/34]
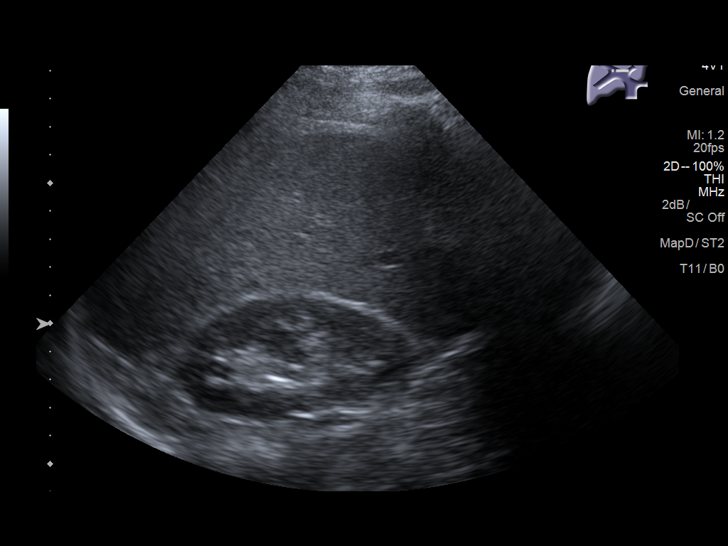
[im 31/34]
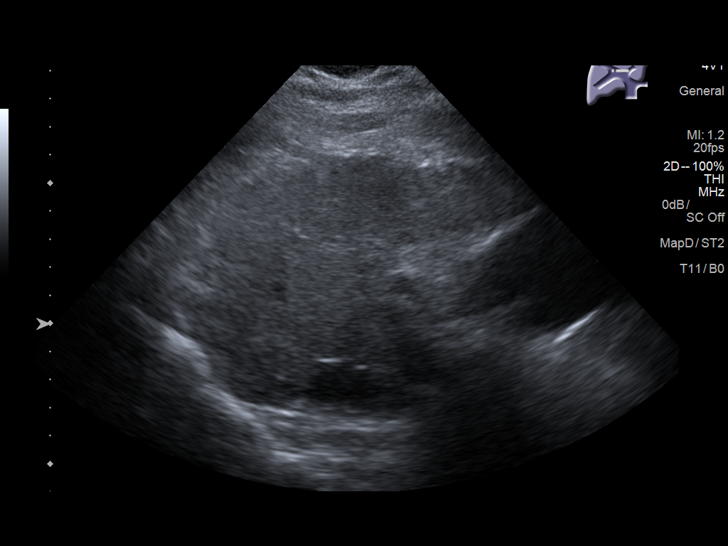
[im 34/34]
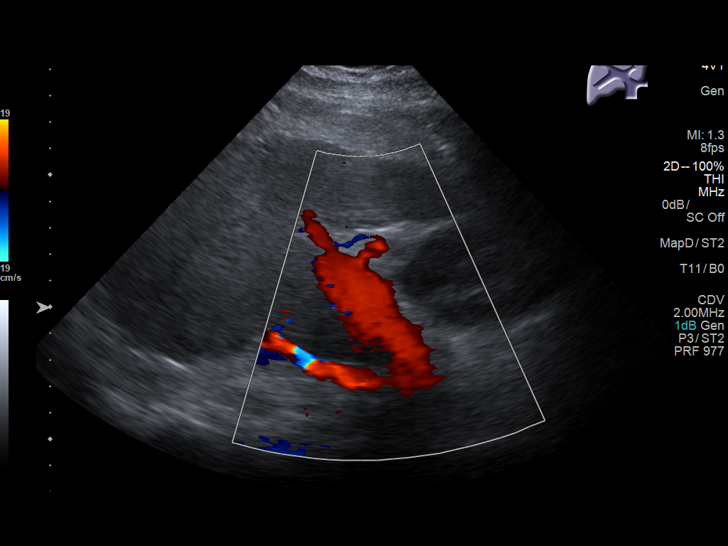

[14 of 25 positions shown; findings below may reference images not displayed]

FINDINGS: Gallbladder:

1.2 cm gallstone. No wall thickening or pericholecystic fluid.
Sonographic Murphy's sign was not elicited.

Common bile duct:

Diameter: Partially obscured by bowel gas. Normal where visualized,
including at 3 mm.

Liver:

Heterogeneous echotexture with surface irregularity, including on
image 36. portal vein is patent on color Doppler imaging with normal
direction of blood flow towards the liver.
IMPRESSION: 1. Cholelithiasis without acute cholecystitis or biliary duct
dilatation.
2. Heterogeneous hepatic echotexture with surface irregularity,
suspicious for cirrhosis. Correlate with risk factors.

## 2019-08-27 IMAGING — CR DG CHEST 2V
3 series · 3 of 3 positions shown · non-contrast
Comparison: None.

CLINICAL DATA: Mid chest pain for 1 month.

EXAM:
CHEST - 2 VIEW

[chest lat]
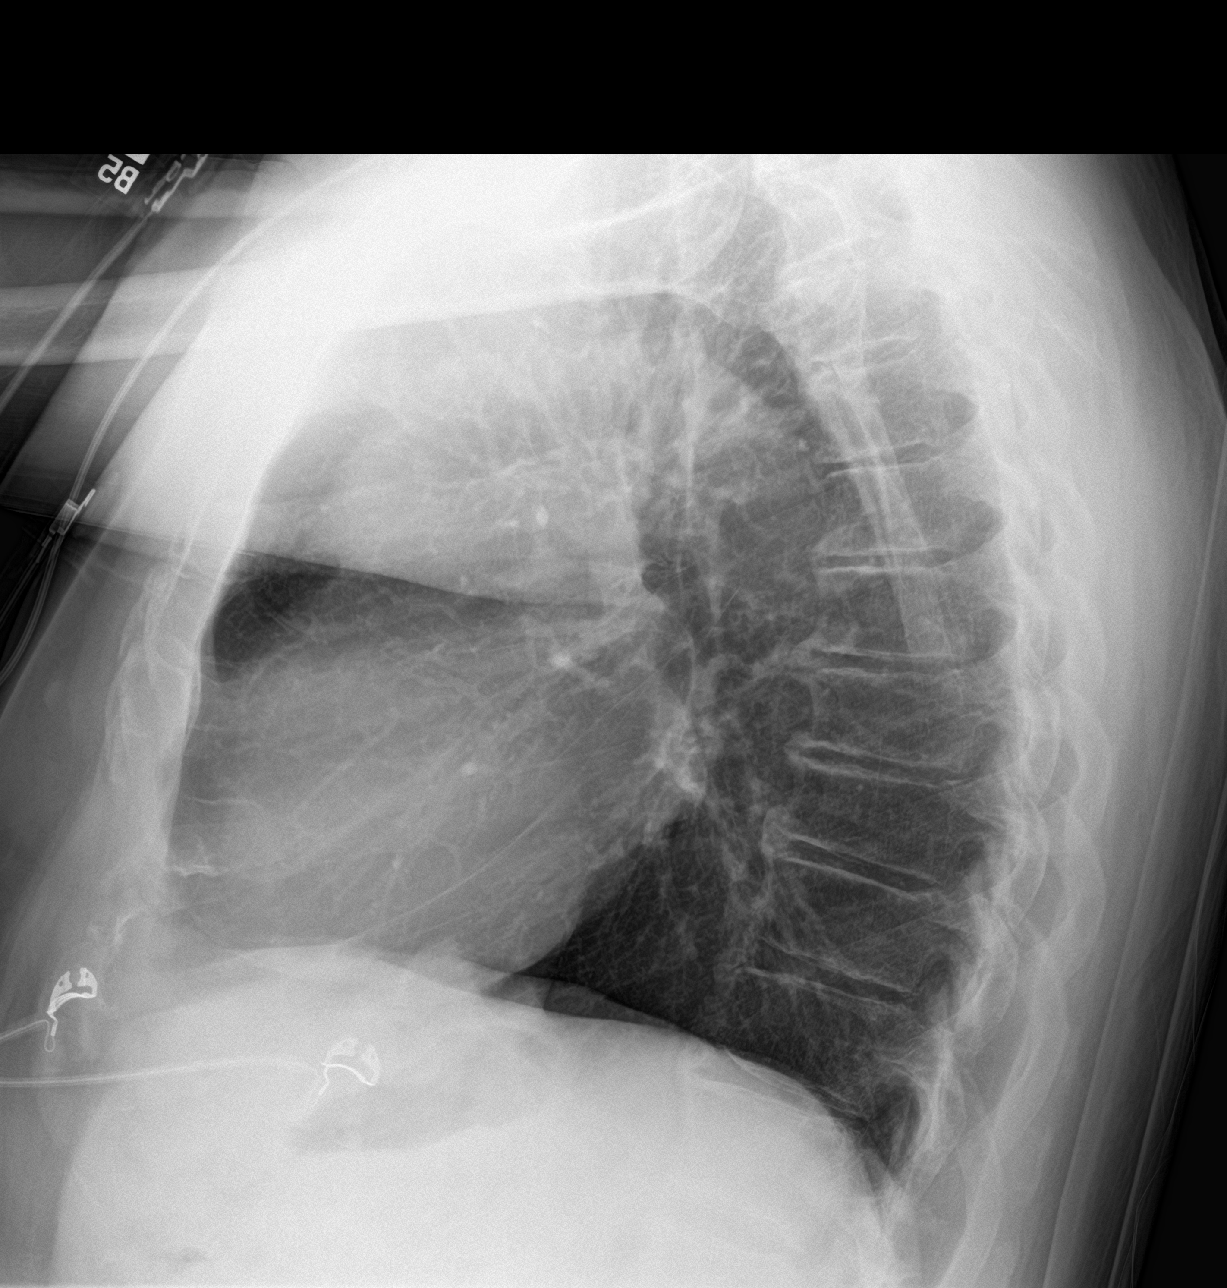

[chest ap (1 of 2)]
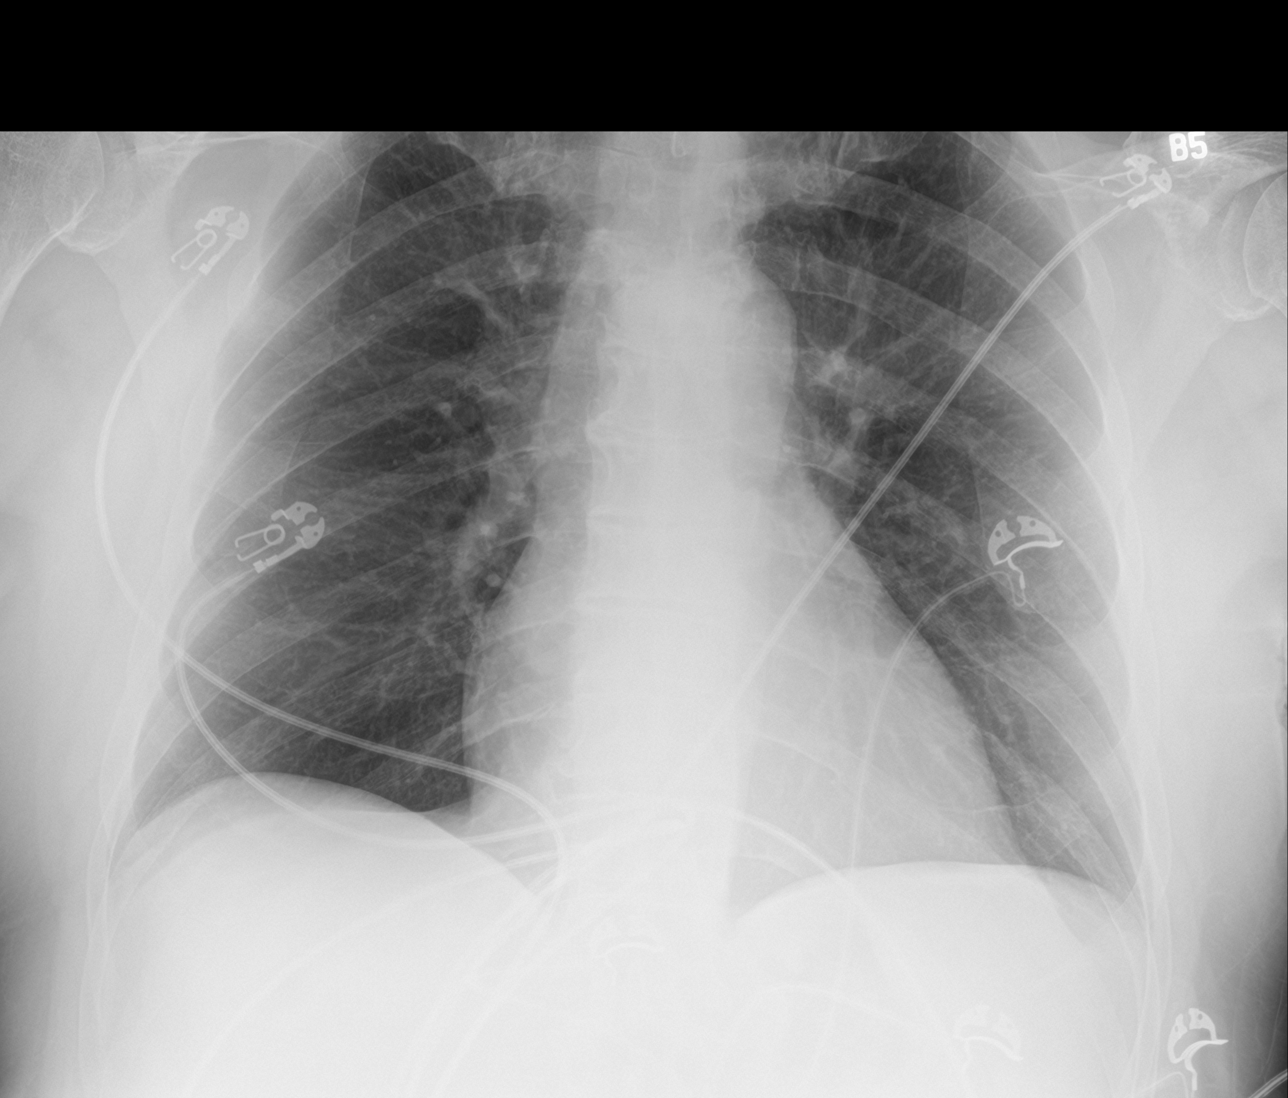

[chest ap (2 of 2)]
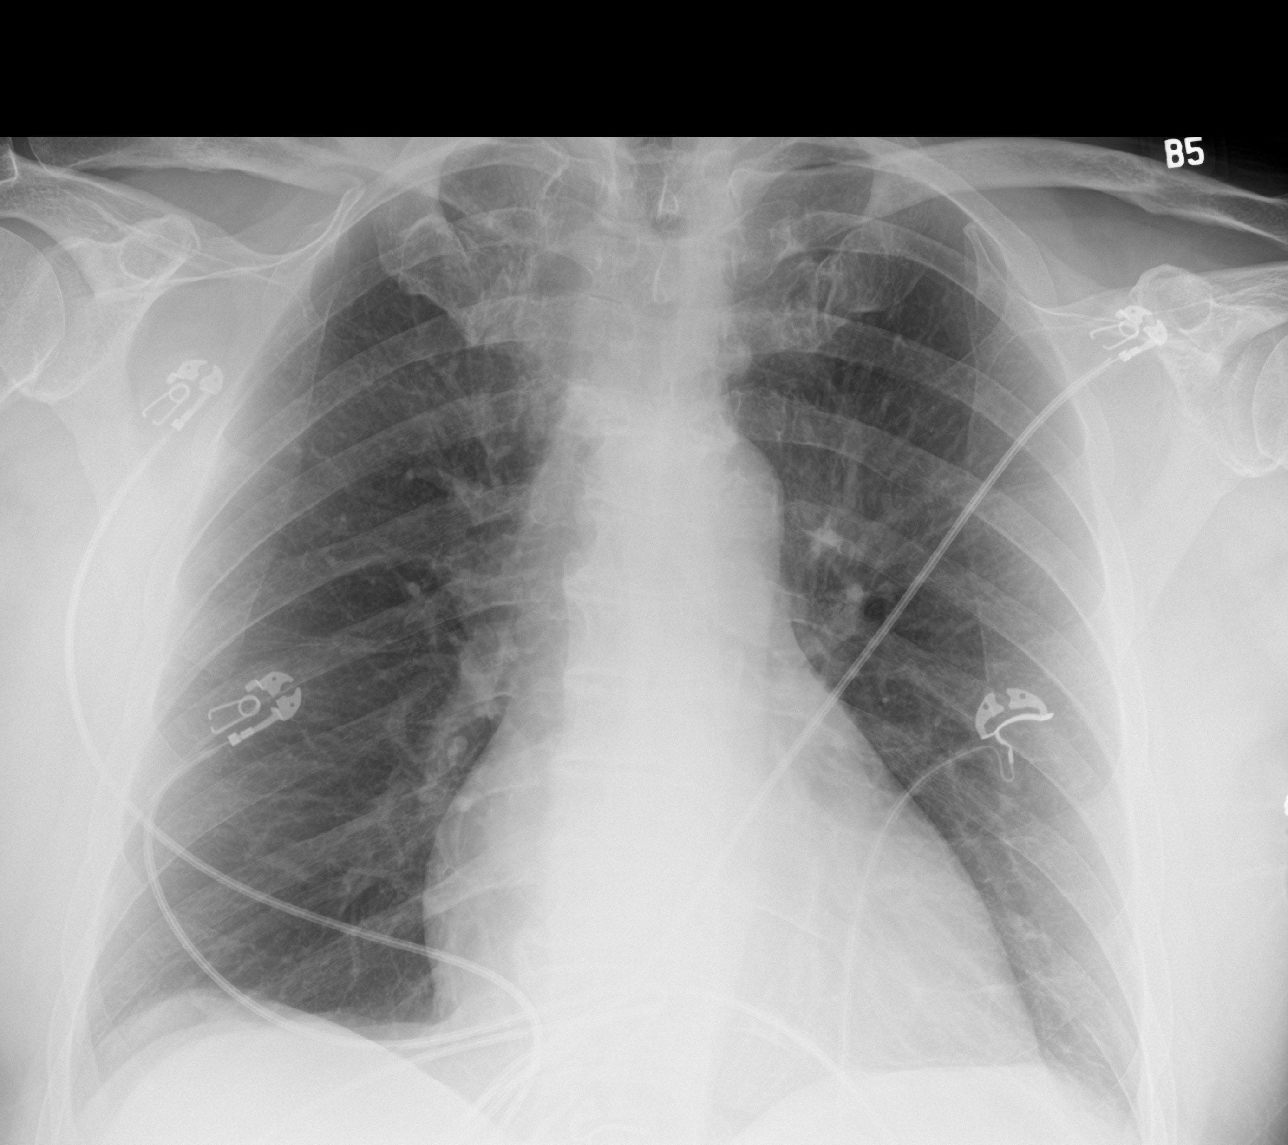

[3 of 3 positions shown; findings below may reference images not displayed]

FINDINGS: Cardiomediastinal silhouette is normal. Mediastinal contours appear
intact. Mild calcific atherosclerotic disease of the aorta.

There is no evidence of focal airspace consolidation, pleural
effusion or pneumothorax.

Osseous structures are without acute abnormality. Soft tissues are
grossly normal.
IMPRESSION: No active cardiopulmonary disease.

Mild calcific atherosclerotic disease of the aorta.

## 2022-06-09 ENCOUNTER — Inpatient Hospital Stay (HOSPITAL_COMMUNITY)
Admission: EM | Admit: 2022-06-09 | Discharge: 2022-06-19 | DRG: 271 | Disposition: A | Discharge status: Home Health | Payer: Medicare Other | Attending: Vascular Surgery | Admitting: Vascular Surgery

## 2022-06-09 DIAGNOSIS — I110 Hypertensive heart disease with heart failure: Secondary | ICD-10-CM | POA: Diagnosis present

## 2022-06-09 DIAGNOSIS — I70223 Atherosclerosis of native arteries of extremities with rest pain, bilateral legs: Principal | ICD-10-CM | POA: Diagnosis present

## 2022-06-09 DIAGNOSIS — Z91148 Patient's other noncompliance with medication regimen for other reason: Secondary | ICD-10-CM

## 2022-06-09 DIAGNOSIS — Z87891 Personal history of nicotine dependence: Secondary | ICD-10-CM

## 2022-06-09 DIAGNOSIS — D62 Acute posthemorrhagic anemia: Secondary | ICD-10-CM | POA: Diagnosis not present

## 2022-06-09 DIAGNOSIS — D696 Thrombocytopenia, unspecified: Secondary | ICD-10-CM | POA: Diagnosis not present

## 2022-06-09 DIAGNOSIS — I998 Other disorder of circulatory system: Principal | ICD-10-CM

## 2022-06-09 DIAGNOSIS — M109 Gout, unspecified: Secondary | ICD-10-CM | POA: Diagnosis present

## 2022-06-09 DIAGNOSIS — N280 Ischemia and infarction of kidney: Secondary | ICD-10-CM | POA: Diagnosis present

## 2022-06-09 DIAGNOSIS — I4819 Other persistent atrial fibrillation: Secondary | ICD-10-CM | POA: Diagnosis present

## 2022-06-09 DIAGNOSIS — R58 Hemorrhage, not elsewhere classified: Secondary | ICD-10-CM | POA: Diagnosis not present

## 2022-06-09 DIAGNOSIS — Z8249 Family history of ischemic heart disease and other diseases of the circulatory system: Secondary | ICD-10-CM

## 2022-06-09 DIAGNOSIS — Z7982 Long term (current) use of aspirin: Secondary | ICD-10-CM

## 2022-06-09 DIAGNOSIS — I429 Cardiomyopathy, unspecified: Secondary | ICD-10-CM | POA: Diagnosis present

## 2022-06-09 DIAGNOSIS — G8918 Other acute postprocedural pain: Secondary | ICD-10-CM | POA: Diagnosis not present

## 2022-06-09 DIAGNOSIS — Z8614 Personal history of Methicillin resistant Staphylococcus aureus infection: Secondary | ICD-10-CM

## 2022-06-09 DIAGNOSIS — I7409 Other arterial embolism and thrombosis of abdominal aorta: Secondary | ICD-10-CM | POA: Diagnosis present

## 2022-06-09 DIAGNOSIS — J45909 Unspecified asthma, uncomplicated: Secondary | ICD-10-CM | POA: Diagnosis present

## 2022-06-09 DIAGNOSIS — I5022 Chronic systolic (congestive) heart failure: Secondary | ICD-10-CM | POA: Diagnosis present

## 2022-06-09 DIAGNOSIS — I10 Essential (primary) hypertension: Secondary | ICD-10-CM

## 2022-06-09 DIAGNOSIS — R200 Anesthesia of skin: Secondary | ICD-10-CM | POA: Diagnosis present

## 2022-06-09 DIAGNOSIS — Z7901 Long term (current) use of anticoagulants: Secondary | ICD-10-CM

## 2022-06-09 DIAGNOSIS — Z885 Allergy status to narcotic agent status: Secondary | ICD-10-CM

## 2022-06-09 DIAGNOSIS — S301XXA Contusion of abdominal wall, initial encounter: Secondary | ICD-10-CM | POA: Diagnosis not present

## 2022-06-09 DIAGNOSIS — I4891 Unspecified atrial fibrillation: Secondary | ICD-10-CM

## 2022-06-09 DIAGNOSIS — I745 Embolism and thrombosis of iliac artery: Secondary | ICD-10-CM | POA: Diagnosis present

## 2022-06-09 DIAGNOSIS — Z9889 Other specified postprocedural states: Secondary | ICD-10-CM

## 2022-06-09 DIAGNOSIS — D689 Coagulation defect, unspecified: Secondary | ICD-10-CM | POA: Diagnosis present

## 2022-06-09 DIAGNOSIS — Z8673 Personal history of transient ischemic attack (TIA), and cerebral infarction without residual deficits: Secondary | ICD-10-CM

## 2022-06-09 DIAGNOSIS — I7410 Embolism and thrombosis of unspecified parts of aorta: Secondary | ICD-10-CM | POA: Diagnosis present

## 2022-06-09 DIAGNOSIS — Z9109 Other allergy status, other than to drugs and biological substances: Secondary | ICD-10-CM

## 2022-06-09 DIAGNOSIS — R202 Paresthesia of skin: Secondary | ICD-10-CM | POA: Diagnosis present

## 2022-06-09 DIAGNOSIS — K7460 Unspecified cirrhosis of liver: Secondary | ICD-10-CM | POA: Diagnosis present

## 2022-06-09 DIAGNOSIS — Z79899 Other long term (current) drug therapy: Secondary | ICD-10-CM

## 2022-06-10 ENCOUNTER — Emergency Department (HOSPITAL_COMMUNITY): Payer: Medicare Other

## 2022-06-10 ENCOUNTER — Other Ambulatory Visit: Payer: Self-pay

## 2022-06-10 ENCOUNTER — Emergency Department (HOSPITAL_COMMUNITY): Payer: Medicare Other | Admitting: Registered Nurse

## 2022-06-10 ENCOUNTER — Inpatient Hospital Stay (HOSPITAL_COMMUNITY): Payer: Medicare Other | Admitting: Anesthesiology

## 2022-06-10 ENCOUNTER — Inpatient Hospital Stay (HOSPITAL_COMMUNITY): Payer: Medicare Other

## 2022-06-10 ENCOUNTER — Encounter (HOSPITAL_COMMUNITY)
Admission: EM | Disposition: A | Discharge status: Home Health | Payer: Self-pay | Source: Home / Self Care | Attending: Vascular Surgery

## 2022-06-10 ENCOUNTER — Encounter (HOSPITAL_COMMUNITY): Payer: Self-pay

## 2022-06-10 DIAGNOSIS — I998 Other disorder of circulatory system: Secondary | ICD-10-CM | POA: Diagnosis not present

## 2022-06-10 DIAGNOSIS — I10 Essential (primary) hypertension: Secondary | ICD-10-CM

## 2022-06-10 DIAGNOSIS — J45909 Unspecified asthma, uncomplicated: Secondary | ICD-10-CM

## 2022-06-10 DIAGNOSIS — D689 Coagulation defect, unspecified: Secondary | ICD-10-CM | POA: Diagnosis present

## 2022-06-10 DIAGNOSIS — Z87891 Personal history of nicotine dependence: Secondary | ICD-10-CM

## 2022-06-10 DIAGNOSIS — I110 Hypertensive heart disease with heart failure: Secondary | ICD-10-CM | POA: Diagnosis present

## 2022-06-10 DIAGNOSIS — F17210 Nicotine dependence, cigarettes, uncomplicated: Secondary | ICD-10-CM | POA: Diagnosis not present

## 2022-06-10 DIAGNOSIS — I745 Embolism and thrombosis of iliac artery: Secondary | ICD-10-CM | POA: Diagnosis present

## 2022-06-10 DIAGNOSIS — Z8673 Personal history of transient ischemic attack (TIA), and cerebral infarction without residual deficits: Secondary | ICD-10-CM | POA: Diagnosis not present

## 2022-06-10 DIAGNOSIS — M109 Gout, unspecified: Secondary | ICD-10-CM | POA: Diagnosis present

## 2022-06-10 DIAGNOSIS — Z91148 Patient's other noncompliance with medication regimen for other reason: Secondary | ICD-10-CM | POA: Diagnosis not present

## 2022-06-10 DIAGNOSIS — I4891 Unspecified atrial fibrillation: Secondary | ICD-10-CM | POA: Diagnosis not present

## 2022-06-10 DIAGNOSIS — K7460 Unspecified cirrhosis of liver: Secondary | ICD-10-CM | POA: Diagnosis present

## 2022-06-10 DIAGNOSIS — Z7901 Long term (current) use of anticoagulants: Secondary | ICD-10-CM | POA: Diagnosis not present

## 2022-06-10 DIAGNOSIS — Z9109 Other allergy status, other than to drugs and biological substances: Secondary | ICD-10-CM | POA: Diagnosis not present

## 2022-06-10 DIAGNOSIS — I7409 Other arterial embolism and thrombosis of abdominal aorta: Secondary | ICD-10-CM | POA: Diagnosis present

## 2022-06-10 DIAGNOSIS — I97638 Postprocedural hematoma of a circulatory system organ or structure following other circulatory system procedure: Secondary | ICD-10-CM | POA: Diagnosis not present

## 2022-06-10 DIAGNOSIS — I4819 Other persistent atrial fibrillation: Secondary | ICD-10-CM | POA: Diagnosis present

## 2022-06-10 DIAGNOSIS — D62 Acute posthemorrhagic anemia: Secondary | ICD-10-CM | POA: Diagnosis not present

## 2022-06-10 DIAGNOSIS — I70223 Atherosclerosis of native arteries of extremities with rest pain, bilateral legs: Secondary | ICD-10-CM | POA: Diagnosis present

## 2022-06-10 DIAGNOSIS — I509 Heart failure, unspecified: Secondary | ICD-10-CM | POA: Diagnosis not present

## 2022-06-10 DIAGNOSIS — I5022 Chronic systolic (congestive) heart failure: Secondary | ICD-10-CM | POA: Diagnosis present

## 2022-06-10 DIAGNOSIS — I7410 Embolism and thrombosis of unspecified parts of aorta: Secondary | ICD-10-CM | POA: Diagnosis present

## 2022-06-10 DIAGNOSIS — I480 Paroxysmal atrial fibrillation: Secondary | ICD-10-CM | POA: Diagnosis not present

## 2022-06-10 DIAGNOSIS — I429 Cardiomyopathy, unspecified: Secondary | ICD-10-CM | POA: Diagnosis present

## 2022-06-10 DIAGNOSIS — M79A22 Nontraumatic compartment syndrome of left lower extremity: Secondary | ICD-10-CM | POA: Diagnosis not present

## 2022-06-10 DIAGNOSIS — Z8614 Personal history of Methicillin resistant Staphylococcus aureus infection: Secondary | ICD-10-CM | POA: Diagnosis not present

## 2022-06-10 DIAGNOSIS — Z9889 Other specified postprocedural states: Secondary | ICD-10-CM | POA: Diagnosis not present

## 2022-06-10 DIAGNOSIS — Z79899 Other long term (current) drug therapy: Secondary | ICD-10-CM | POA: Diagnosis not present

## 2022-06-10 DIAGNOSIS — M79A21 Nontraumatic compartment syndrome of right lower extremity: Secondary | ICD-10-CM | POA: Diagnosis not present

## 2022-06-10 DIAGNOSIS — Z885 Allergy status to narcotic agent status: Secondary | ICD-10-CM | POA: Diagnosis not present

## 2022-06-10 DIAGNOSIS — D696 Thrombocytopenia, unspecified: Secondary | ICD-10-CM | POA: Diagnosis not present

## 2022-06-10 DIAGNOSIS — N280 Ischemia and infarction of kidney: Secondary | ICD-10-CM | POA: Diagnosis present

## 2022-06-10 DIAGNOSIS — T8189XA Other complications of procedures, not elsewhere classified, initial encounter: Secondary | ICD-10-CM | POA: Diagnosis not present

## 2022-06-10 DIAGNOSIS — Z8249 Family history of ischemic heart disease and other diseases of the circulatory system: Secondary | ICD-10-CM | POA: Diagnosis not present

## 2022-06-10 DIAGNOSIS — Z7982 Long term (current) use of aspirin: Secondary | ICD-10-CM | POA: Diagnosis not present

## 2022-06-10 HISTORY — PX: APPLICATION OF WOUND VAC: SHX5189

## 2022-06-10 HISTORY — PX: AORTOGRAM: SHX6300

## 2022-06-10 HISTORY — PX: THROMBECTOMY FEMORAL ARTERY: SHX6406

## 2022-06-10 HISTORY — PX: FASCIOTOMY: SHX132

## 2022-06-10 HISTORY — PX: HEMATOMA EVACUATION: SHX5118

## 2022-06-10 HISTORY — PX: LOWER EXTREMITY ANGIOGRAM: SHX5508

## 2022-06-10 HISTORY — PX: EMBOLECTOMY: SHX44

## 2022-06-10 LAB — CBC
HCT: 30 % — ABNORMAL LOW (ref 39.0–52.0)
HCT: 24 % — ABNORMAL LOW (ref 39.0–52.0)
Hemoglobin: 10.2 g/dL — ABNORMAL LOW (ref 13.0–17.0)
Hemoglobin: 8.4 g/dL — ABNORMAL LOW (ref 13.0–17.0)
MCH: 34.6 pg — ABNORMAL HIGH (ref 26.0–34.0)
MCH: 33.7 pg (ref 26.0–34.0)
MCHC: 34 g/dL (ref 30.0–36.0)
MCHC: 35 g/dL (ref 30.0–36.0)
MCV: 101.7 fL — ABNORMAL HIGH (ref 80.0–100.0)
MCV: 96.4 fL (ref 80.0–100.0)
Platelets: 60 10*3/uL — ABNORMAL LOW (ref 150–400)
Platelets: 49 10*3/uL — ABNORMAL LOW (ref 150–400)
RBC: 2.95 MIL/uL — ABNORMAL LOW (ref 4.22–5.81)
RBC: 2.49 MIL/uL — ABNORMAL LOW (ref 4.22–5.81)
RDW: 15.9 % — ABNORMAL HIGH (ref 11.5–15.5)
RDW: 17.2 % — ABNORMAL HIGH (ref 11.5–15.5)
WBC: 9.1 10*3/uL (ref 4.0–10.5)
WBC: 14.9 10*3/uL — ABNORMAL HIGH (ref 4.0–10.5)
nRBC: 0.2 % (ref 0.0–0.2)
nRBC: 0 % (ref 0.0–0.2)

## 2022-06-10 LAB — COMPREHENSIVE METABOLIC PANEL
ALT: 114 U/L — ABNORMAL HIGH (ref 0–44)
ALT: 76 U/L — ABNORMAL HIGH (ref 0–44)
ALT: 93 U/L — ABNORMAL HIGH (ref 0–44)
AST: 131 U/L — ABNORMAL HIGH (ref 15–41)
AST: 143 U/L — ABNORMAL HIGH (ref 15–41)
AST: 157 U/L — ABNORMAL HIGH (ref 15–41)
Albumin: 2.3 g/dL — ABNORMAL LOW (ref 3.5–5.0)
Albumin: 2.7 g/dL — ABNORMAL LOW (ref 3.5–5.0)
Albumin: 3.2 g/dL — ABNORMAL LOW (ref 3.5–5.0)
Alkaline Phosphatase: 119 U/L (ref 38–126)
Alkaline Phosphatase: 56 U/L (ref 38–126)
Alkaline Phosphatase: 79 U/L (ref 38–126)
Anion gap: 11 (ref 5–15)
Anion gap: 4 — ABNORMAL LOW (ref 5–15)
Anion gap: 8 (ref 5–15)
BUN: 13 mg/dL (ref 8–23)
BUN: 15 mg/dL (ref 8–23)
BUN: 18 mg/dL (ref 8–23)
CO2: 20 mmol/L — ABNORMAL LOW (ref 22–32)
CO2: 21 mmol/L — ABNORMAL LOW (ref 22–32)
CO2: 22 mmol/L (ref 22–32)
Calcium: 8.3 mg/dL — ABNORMAL LOW (ref 8.9–10.3)
Calcium: 8.4 mg/dL — ABNORMAL LOW (ref 8.9–10.3)
Calcium: 9.3 mg/dL (ref 8.9–10.3)
Chloride: 104 mmol/L (ref 98–111)
Chloride: 105 mmol/L (ref 98–111)
Chloride: 106 mmol/L (ref 98–111)
Creatinine, Ser: 0.99 mg/dL (ref 0.61–1.24)
Creatinine, Ser: 1.01 mg/dL (ref 0.61–1.24)
Creatinine, Ser: 1.2 mg/dL (ref 0.61–1.24)
GFR, Estimated: >60 mL/min (ref >60)
GFR, Estimated: >60 mL/min (ref >60)
GFR, Estimated: >60 mL/min (ref >60)
Glucose, Bld: 132 mg/dL — ABNORMAL HIGH (ref 70–99)
Glucose, Bld: 142 mg/dL — ABNORMAL HIGH (ref 70–99)
Glucose, Bld: 163 mg/dL — ABNORMAL HIGH (ref 70–99)
Potassium: 3.4 mmol/L — ABNORMAL LOW (ref 3.5–5.1)
Potassium: 4 mmol/L (ref 3.5–5.1)
Potassium: 4.2 mmol/L (ref 3.5–5.1)
Sodium: 130 mmol/L — ABNORMAL LOW (ref 135–145)
Sodium: 134 mmol/L — ABNORMAL LOW (ref 135–145)
Sodium: 137 mmol/L (ref 135–145)
Total Bilirubin: 1.2 mg/dL (ref 0.3–1.2)
Total Bilirubin: 1.6 mg/dL — ABNORMAL HIGH (ref 0.3–1.2)
Total Bilirubin: 1.7 mg/dL — ABNORMAL HIGH (ref 0.3–1.2)
Total Protein: 4.7 g/dL — ABNORMAL LOW (ref 6.5–8.1)
Total Protein: 5.7 g/dL — ABNORMAL LOW (ref 6.5–8.1)
Total Protein: 7 g/dL (ref 6.5–8.1)

## 2022-06-10 LAB — POCT I-STAT 7, (LYTES, BLD GAS, ICA,H+H)
Acid-base deficit: 4 mmol/L — ABNORMAL HIGH (ref 0.0–2.0)
Acid-base deficit: 4 mmol/L — ABNORMAL HIGH (ref 0.0–2.0)
Bicarbonate: 23.1 mmol/L (ref 20.0–28.0)
Bicarbonate: 21.1 mmol/L (ref 20.0–28.0)
Calcium, Ion: 1.25 mmol/L (ref 1.15–1.40)
Calcium, Ion: 1.18 mmol/L (ref 1.15–1.40)
HCT: 37 % — ABNORMAL LOW (ref 39.0–52.0)
HCT: 26 % — ABNORMAL LOW (ref 39.0–52.0)
Hemoglobin: 12.6 g/dL — ABNORMAL LOW (ref 13.0–17.0)
Hemoglobin: 8.8 g/dL — ABNORMAL LOW (ref 13.0–17.0)
O2 Saturation: 100 %
O2 Saturation: 100 %
Potassium: 4.6 mmol/L (ref 3.5–5.1)
Potassium: 4.7 mmol/L (ref 3.5–5.1)
Sodium: 134 mmol/L — ABNORMAL LOW (ref 135–145)
Sodium: 136 mmol/L (ref 135–145)
TCO2: 25 mmol/L (ref 22–32)
TCO2: 22 mmol/L (ref 22–32)
pCO2 arterial: 48.5 mmHg — ABNORMAL HIGH (ref 32–48)
pCO2 arterial: 39.9 mmHg (ref 32–48)
pH, Arterial: 7.287 — ABNORMAL LOW (ref 7.35–7.45)
pH, Arterial: 7.333 — ABNORMAL LOW (ref 7.35–7.45)
pO2, Arterial: 259 mmHg — ABNORMAL HIGH (ref 83–108)
pO2, Arterial: 412 mmHg — ABNORMAL HIGH (ref 83–108)

## 2022-06-10 LAB — CBC WITH DIFFERENTIAL/PLATELET
Abs Immature Granulocytes: 0.03 10*3/uL (ref 0.00–0.07)
Basophils Absolute: 0.1 10*3/uL (ref 0.0–0.1)
Basophils Relative: 1 %
Eosinophils Absolute: 0.3 10*3/uL (ref 0.0–0.5)
Eosinophils Relative: 4 %
HCT: 38.4 % — ABNORMAL LOW (ref 39.0–52.0)
Hemoglobin: 13.2 g/dL (ref 13.0–17.0)
Immature Granulocytes: 0 %
Lymphocytes Relative: 37 %
Lymphs Abs: 3.1 10*3/uL (ref 0.7–4.0)
MCH: 34.6 pg — ABNORMAL HIGH (ref 26.0–34.0)
MCHC: 34.4 g/dL (ref 30.0–36.0)
MCV: 100.8 fL — ABNORMAL HIGH (ref 80.0–100.0)
Monocytes Absolute: 0.9 10*3/uL (ref 0.1–1.0)
Monocytes Relative: 11 %
Neutro Abs: 4.1 10*3/uL (ref 1.7–7.7)
Neutrophils Relative %: 47 %
Platelets: 66 10*3/uL — ABNORMAL LOW (ref 150–400)
RBC: 3.81 MIL/uL — ABNORMAL LOW (ref 4.22–5.81)
RDW: 15.8 % — ABNORMAL HIGH (ref 11.5–15.5)
WBC: 8.5 10*3/uL (ref 4.0–10.5)
nRBC: 0 % (ref 0.0–0.2)

## 2022-06-10 LAB — I-STAT CHEM 8, ED
BUN: 17 mg/dL (ref 8–23)
Calcium, Ion: 1.18 mmol/L (ref 1.15–1.40)
Chloride: 104 mmol/L (ref 98–111)
Creatinine, Ser: 0.9 mg/dL (ref 0.61–1.24)
Glucose, Bld: 143 mg/dL — ABNORMAL HIGH (ref 70–99)
HCT: 45 % (ref 39.0–52.0)
Hemoglobin: 15.3 g/dL (ref 13.0–17.0)
Potassium: 3.4 mmol/L — ABNORMAL LOW (ref 3.5–5.1)
Sodium: 139 mmol/L (ref 135–145)
TCO2: 20 mmol/L — ABNORMAL LOW (ref 22–32)

## 2022-06-10 LAB — PREPARE RBC (CROSSMATCH)

## 2022-06-10 LAB — POCT ACTIVATED CLOTTING TIME
Activated Clotting Time: 304 seconds
Activated Clotting Time: 250 seconds

## 2022-06-10 LAB — ABO/RH: ABO/RH(D): A NEG

## 2022-06-10 LAB — PROTIME-INR
INR: 1.3 — ABNORMAL HIGH (ref 0.8–1.2)
Prothrombin Time: 16.5 seconds — ABNORMAL HIGH (ref 11.4–15.2)

## 2022-06-10 LAB — HEPARIN LEVEL (UNFRACTIONATED)
Heparin Unfractionated: >1.1 IU/mL — ABNORMAL HIGH (ref 0.30–0.70)
Heparin Unfractionated: 1.05 IU/mL — ABNORMAL HIGH (ref 0.30–0.70)
Heparin Unfractionated: 0.35 IU/mL (ref 0.30–0.70)

## 2022-06-10 LAB — MAGNESIUM: Magnesium: 1.8 mg/dL (ref 1.7–2.4)

## 2022-06-10 SURGERY — THROMBECTOMY, ARTERY, FEMORAL
Anesthesia: General | Site: Leg Lower

## 2022-06-10 SURGERY — GROIN EXPOSURE
Anesthesia: General | Site: Groin | Laterality: Right

## 2022-06-10 MED ORDER — VASOPRESSIN 20 UNIT/ML IV SOLN
INTRAVENOUS | Status: DC | PRN
  Administered 2022-06-10: 1 [IU] via INTRAVENOUS

## 2022-06-10 MED ORDER — POTASSIUM CHLORIDE CRYS ER 20 MEQ PO TBCR: 20.0000 meq | EXTENDED_RELEASE_TABLET | Freq: Every day | ORAL | Status: DC | PRN

## 2022-06-10 MED ORDER — PROPOFOL 10 MG/ML IV BOLUS
INTRAVENOUS | Status: DC | PRN
  Administered 2022-06-10: 140 mg via INTRAVENOUS

## 2022-06-10 MED ORDER — BISACODYL 5 MG PO TBEC: 5.0000 mg | DELAYED_RELEASE_TABLET | Freq: Every day | ORAL | Status: DC | PRN

## 2022-06-10 MED ORDER — MIDAZOLAM HCL 2 MG/2ML IJ SOLN
INTRAMUSCULAR | Status: AC
  Filled 2022-06-10: qty 2

## 2022-06-10 MED ORDER — LIDOCAINE HCL (CARDIAC) PF 100 MG/5ML IV SOSY
PREFILLED_SYRINGE | INTRAVENOUS | Status: DC | PRN
  Administered 2022-06-10: 100 mg via INTRATRACHEAL

## 2022-06-10 MED ORDER — ROCURONIUM BROMIDE 10 MG/ML (PF) SYRINGE
PREFILLED_SYRINGE | INTRAVENOUS | Status: AC
  Filled 2022-06-10: qty 10

## 2022-06-10 MED ORDER — LACTATED RINGERS IV SOLN: INTRAVENOUS | Status: DC | PRN

## 2022-06-10 MED ORDER — AMISULPRIDE (ANTIEMETIC) 5 MG/2ML IV SOLN: 10.0000 mg | Freq: Once | INTRAVENOUS | Status: DC | PRN

## 2022-06-10 MED ORDER — ASPIRIN 81 MG PO TBEC
81.0000 mg | DELAYED_RELEASE_TABLET | Freq: Every day | ORAL | Status: DC
  Administered 2022-06-10 – 2022-06-11 (×2): 81 mg via ORAL
  Filled 2022-06-10 (×2): qty 1

## 2022-06-10 MED ORDER — HEPARIN (PORCINE) 25000 UT/250ML-% IV SOLN
1500.0000 [IU]/h | INTRAVENOUS | Status: DC
  Administered 2022-06-10 – 2022-06-11 (×2): 1500 [IU]/h via INTRAVENOUS
  Filled 2022-06-10 (×2): qty 250

## 2022-06-10 MED ORDER — MAGNESIUM SULFATE 2 GM/50ML IV SOLN: 2.0000 g | Freq: Every day | INTRAVENOUS | Status: DC | PRN

## 2022-06-10 MED ORDER — SUGAMMADEX SODIUM 200 MG/2ML IV SOLN
INTRAVENOUS | Status: DC | PRN
  Administered 2022-06-10: 200 mg via INTRAVENOUS

## 2022-06-10 MED ORDER — IODIXANOL 320 MG/ML IV SOLN
INTRAVENOUS | Status: DC | PRN
  Administered 2022-06-10: 100 mL
  Administered 2022-06-10: 30 mL

## 2022-06-10 MED ORDER — FENTANYL CITRATE (PF) 100 MCG/2ML IJ SOLN: 25.0000 ug | INTRAMUSCULAR | Status: DC | PRN

## 2022-06-10 MED ORDER — FENTANYL CITRATE (PF) 250 MCG/5ML IJ SOLN
INTRAMUSCULAR | Status: DC | PRN
  Administered 2022-06-10: 25 ug via INTRAVENOUS
  Administered 2022-06-10: 50 ug via INTRAVENOUS
  Administered 2022-06-10: 25 ug via INTRAVENOUS
  Administered 2022-06-10 (×2): 50 ug via INTRAVENOUS
  Administered 2022-06-10: 100 ug via INTRAVENOUS

## 2022-06-10 MED ORDER — FENTANYL CITRATE (PF) 250 MCG/5ML IJ SOLN
INTRAMUSCULAR | Status: AC
  Filled 2022-06-10: qty 5

## 2022-06-10 MED ORDER — ALBUMIN HUMAN 5 % IV SOLN: INTRAVENOUS | Status: DC | PRN

## 2022-06-10 MED ORDER — ONDANSETRON HCL 4 MG/2ML IJ SOLN
INTRAMUSCULAR | Status: DC | PRN
  Administered 2022-06-10: 4 mg via INTRAVENOUS

## 2022-06-10 MED ORDER — MEPERIDINE HCL 25 MG/ML IJ SOLN: 6.2500 mg | INTRAMUSCULAR | Status: DC | PRN

## 2022-06-10 MED ORDER — METOPROLOL TARTRATE 5 MG/5ML IV SOLN
INTRAVENOUS | Status: DC | PRN
  Administered 2022-06-10 (×2): 2.5 mg via INTRAVENOUS

## 2022-06-10 MED ORDER — ROCURONIUM BROMIDE 10 MG/ML (PF) SYRINGE
PREFILLED_SYRINGE | INTRAVENOUS | Status: DC | PRN
  Administered 2022-06-10: 100 mg via INTRAVENOUS

## 2022-06-10 MED ORDER — HEPARIN 6000 UNIT IRRIGATION SOLUTION
Status: DC | PRN
  Administered 2022-06-10: 1

## 2022-06-10 MED ORDER — CYCLOBENZAPRINE HCL 10 MG PO TABS
10.0000 mg | ORAL_TABLET | Freq: Three times a day (TID) | ORAL | Status: DC | PRN
  Administered 2022-06-15: 10 mg via ORAL
  Filled 2022-06-10: qty 1

## 2022-06-10 MED ORDER — PANTOPRAZOLE SODIUM 40 MG PO TBEC
40.0000 mg | DELAYED_RELEASE_TABLET | Freq: Every day | ORAL | Status: DC
  Administered 2022-06-10 – 2022-06-19 (×9): 40 mg via ORAL
  Filled 2022-06-10 (×9): qty 1

## 2022-06-10 MED ORDER — HEPARIN SODIUM (PORCINE) 1000 UNIT/ML IJ SOLN
INTRAMUSCULAR | Status: AC
  Filled 2022-06-10: qty 10

## 2022-06-10 MED ORDER — LIDOCAINE 2% (20 MG/ML) 5 ML SYRINGE
INTRAMUSCULAR | Status: DC | PRN
  Administered 2022-06-10: 60 mg via INTRAVENOUS

## 2022-06-10 MED ORDER — SUCCINYLCHOLINE CHLORIDE 200 MG/10ML IV SOSY
PREFILLED_SYRINGE | INTRAVENOUS | Status: DC | PRN
  Administered 2022-06-10: 100 mg via INTRAVENOUS

## 2022-06-10 MED ORDER — METOPROLOL TARTRATE 5 MG/5ML IV SOLN
2.0000 mg | INTRAVENOUS | Status: AC | PRN
  Administered 2022-06-10 – 2022-06-15 (×2): 5 mg via INTRAVENOUS
  Filled 2022-06-10 (×2): qty 5

## 2022-06-10 MED ORDER — METOPROLOL SUCCINATE ER 25 MG PO TB24
25.0000 mg | ORAL_TABLET | Freq: Every day | ORAL | Status: DC
  Administered 2022-06-10 – 2022-06-16 (×7): 25 mg via ORAL
  Filled 2022-06-10 (×7): qty 1

## 2022-06-10 MED ORDER — 0.9 % SODIUM CHLORIDE (POUR BTL) OPTIME
TOPICAL | Status: DC | PRN
  Administered 2022-06-10: 1000 mL

## 2022-06-10 MED ORDER — PHENYLEPHRINE HCL-NACL 20-0.9 MG/250ML-% IV SOLN
INTRAVENOUS | Status: DC | PRN
  Administered 2022-06-10: 30 ug/min via INTRAVENOUS

## 2022-06-10 MED ORDER — HEPARIN 6000 UNIT IRRIGATION SOLUTION
Status: AC
  Filled 2022-06-10: qty 500

## 2022-06-10 MED ORDER — CEFAZOLIN SODIUM 1 G IJ SOLR
INTRAMUSCULAR | Status: AC
  Filled 2022-06-10: qty 20

## 2022-06-10 MED ORDER — SODIUM CHLORIDE 0.9 % IV SOLN: INTRAVENOUS | Status: DC

## 2022-06-10 MED ORDER — HYDRALAZINE HCL 20 MG/ML IJ SOLN: 5.0000 mg | INTRAMUSCULAR | Status: DC | PRN

## 2022-06-10 MED ORDER — HEMOSTATIC AGENTS (NO CHARGE) OPTIME
TOPICAL | Status: DC | PRN
  Administered 2022-06-10 (×2): 1 via TOPICAL

## 2022-06-10 MED ORDER — COLCHICINE 0.6 MG PO TABS
0.6000 mg | ORAL_TABLET | Freq: Every day | ORAL | Status: DC
  Administered 2022-06-10 – 2022-06-13 (×3): 0.6 mg via ORAL
  Filled 2022-06-10 (×3): qty 1

## 2022-06-10 MED ORDER — DOCUSATE SODIUM 100 MG PO CAPS
100.0000 mg | ORAL_CAPSULE | Freq: Every day | ORAL | Status: DC
  Administered 2022-06-11 – 2022-06-19 (×8): 100 mg via ORAL
  Filled 2022-06-10 (×8): qty 1

## 2022-06-10 MED ORDER — ROCURONIUM BROMIDE 10 MG/ML (PF) SYRINGE
PREFILLED_SYRINGE | INTRAVENOUS | Status: DC | PRN
  Administered 2022-06-10: 60 mg via INTRAVENOUS
  Administered 2022-06-10 (×2): 20 mg via INTRAVENOUS

## 2022-06-10 MED ORDER — GUAIFENESIN-DM 100-10 MG/5ML PO SYRP: 15.0000 mL | ORAL_SOLUTION | ORAL | Status: DC | PRN

## 2022-06-10 MED ORDER — VASOPRESSIN 20 UNIT/ML IV SOLN
INTRAVENOUS | Status: AC
  Filled 2022-06-10: qty 1

## 2022-06-10 MED ORDER — FOLIC ACID 1 MG PO TABS
1.0000 mg | ORAL_TABLET | Freq: Every day | ORAL | Status: DC
  Administered 2022-06-10 – 2022-06-19 (×9): 1 mg via ORAL
  Filled 2022-06-10 (×9): qty 1

## 2022-06-10 MED ORDER — HEPARIN (PORCINE) 25000 UT/250ML-% IV SOLN
1500.0000 [IU]/h | INTRAVENOUS | Status: DC
  Administered 2022-06-10: 1700 [IU]/h via INTRAVENOUS
  Filled 2022-06-10: qty 250

## 2022-06-10 MED ORDER — MIDAZOLAM HCL 2 MG/2ML IJ SOLN
INTRAMUSCULAR | Status: DC | PRN
  Administered 2022-06-10: 2 mg via INTRAVENOUS

## 2022-06-10 MED ORDER — ACETAMINOPHEN 10 MG/ML IV SOLN: 1000.0000 mg | Freq: Once | INTRAVENOUS | Status: DC | PRN

## 2022-06-10 MED ORDER — PHENYLEPHRINE HCL (PRESSORS) 10 MG/ML IV SOLN
INTRAVENOUS | Status: DC | PRN
  Administered 2022-06-10: 80 ug via INTRAVENOUS
  Administered 2022-06-10: 160 ug via INTRAVENOUS
  Administered 2022-06-10: 80 ug via INTRAVENOUS

## 2022-06-10 MED ORDER — CEFAZOLIN SODIUM-DEXTROSE 2-3 GM-%(50ML) IV SOLR
INTRAVENOUS | Status: DC | PRN
  Administered 2022-06-10: 2 g via INTRAVENOUS

## 2022-06-10 MED ORDER — LISINOPRIL 10 MG PO TABS
10.0000 mg | ORAL_TABLET | Freq: Every day | ORAL | Status: DC
  Administered 2022-06-11 – 2022-06-16 (×5): 10 mg via ORAL
  Filled 2022-06-10 (×5): qty 1

## 2022-06-10 MED ORDER — FENTANYL CITRATE (PF) 250 MCG/5ML IJ SOLN
INTRAMUSCULAR | Status: DC | PRN
  Administered 2022-06-10: 50 ug via INTRAVENOUS

## 2022-06-10 MED ORDER — LABETALOL HCL 5 MG/ML IV SOLN
10.0000 mg | INTRAVENOUS | Status: DC | PRN
  Filled 2022-06-10 (×3): qty 4

## 2022-06-10 MED ORDER — HEPARIN SODIUM (PORCINE) 1000 UNIT/ML IJ SOLN
INTRAMUSCULAR | Status: DC | PRN
  Administered 2022-06-10: 2000 [IU] via INTRAVENOUS
  Administered 2022-06-10: 5000 [IU] via INTRAVENOUS

## 2022-06-10 MED ORDER — ACETAMINOPHEN 325 MG PO TABS: 325.0000 mg | ORAL_TABLET | ORAL | Status: DC | PRN

## 2022-06-10 MED ORDER — MIDAZOLAM HCL 2 MG/2ML IJ SOLN: 1.0000 mg | INTRAMUSCULAR | Status: DC | PRN

## 2022-06-10 MED ORDER — PHENOL 1.4 % MT LIQD: 1.0000 | OROMUCOSAL | Status: DC | PRN

## 2022-06-10 MED ORDER — IOHEXOL 350 MG/ML SOLN
100.0000 mL | Freq: Once | INTRAVENOUS | Status: AC | PRN
  Administered 2022-06-10: 100 mL via INTRAVENOUS

## 2022-06-10 MED ORDER — CHLORHEXIDINE GLUCONATE CLOTH 2 % EX PADS
6.0000 | MEDICATED_PAD | Freq: Every day | CUTANEOUS | Status: DC
  Administered 2022-06-10 – 2022-06-15 (×5): 6 via TOPICAL

## 2022-06-10 MED ORDER — THIAMINE MONONITRATE 100 MG PO TABS
100.0000 mg | ORAL_TABLET | Freq: Every day | ORAL | Status: DC
  Administered 2022-06-10 – 2022-06-19 (×9): 100 mg via ORAL
  Filled 2022-06-10 (×9): qty 1

## 2022-06-10 MED ORDER — ADULT MULTIVITAMIN W/MINERALS CH
1.0000 | ORAL_TABLET | Freq: Every day | ORAL | Status: DC
  Administered 2022-06-10 – 2022-06-19 (×9): 1 via ORAL
  Filled 2022-06-10 (×8): qty 1

## 2022-06-10 MED ORDER — LIDOCAINE 2% (20 MG/ML) 5 ML SYRINGE
INTRAMUSCULAR | Status: AC
  Filled 2022-06-10: qty 5

## 2022-06-10 MED ORDER — FLEET ENEMA 7-19 GM/118ML RE ENEM: 1.0000 | ENEMA | Freq: Once | RECTAL | Status: DC | PRN

## 2022-06-10 MED ORDER — FENTANYL CITRATE PF 50 MCG/ML IJ SOSY
50.0000 ug | PREFILLED_SYRINGE | Freq: Once | INTRAMUSCULAR | Status: AC
  Administered 2022-06-10: 50 ug via INTRAVENOUS
  Filled 2022-06-10: qty 1

## 2022-06-10 MED ORDER — PROPOFOL 10 MG/ML IV BOLUS
INTRAVENOUS | Status: DC | PRN
  Administered 2022-06-10: 110 mg via INTRAVENOUS

## 2022-06-10 MED ORDER — SODIUM CHLORIDE 0.9 % IV SOLN: 500.0000 mL | Freq: Once | INTRAVENOUS | Status: DC | PRN

## 2022-06-10 MED ORDER — PROPOFOL 10 MG/ML IV BOLUS
INTRAVENOUS | Status: AC
  Filled 2022-06-10: qty 20

## 2022-06-10 MED ORDER — DEXAMETHASONE SODIUM PHOSPHATE 10 MG/ML IJ SOLN
INTRAMUSCULAR | Status: DC | PRN
  Administered 2022-06-10: 5 mg via INTRAVENOUS

## 2022-06-10 MED ORDER — SODIUM CHLORIDE 0.9 % IV SOLN: 10.0000 mL/h | Freq: Once | INTRAVENOUS | Status: DC

## 2022-06-10 MED ORDER — CEFAZOLIN SODIUM-DEXTROSE 2-4 GM/100ML-% IV SOLN
2.0000 g | Freq: Three times a day (TID) | INTRAVENOUS | Status: DC
  Administered 2022-06-10: 2 g via INTRAVENOUS
  Filled 2022-06-10: qty 100

## 2022-06-10 MED ORDER — ESMOLOL HCL 100 MG/10ML IV SOLN
INTRAVENOUS | Status: DC | PRN
  Administered 2022-06-10 (×2): 30 mg via INTRAVENOUS
  Administered 2022-06-10 (×2): 20 mg via INTRAVENOUS

## 2022-06-10 MED ORDER — HYDROMORPHONE HCL 1 MG/ML IJ SOLN
1.0000 mg | Freq: Once | INTRAMUSCULAR | Status: AC
  Administered 2022-06-10: 1 mg via INTRAVENOUS
  Filled 2022-06-10: qty 1

## 2022-06-10 MED ORDER — ONDANSETRON HCL 4 MG/2ML IJ SOLN
INTRAMUSCULAR | Status: AC
  Filled 2022-06-10: qty 2

## 2022-06-10 MED ORDER — METOPROLOL TARTRATE 5 MG/5ML IV SOLN
INTRAVENOUS | Status: AC
  Filled 2022-06-10: qty 5

## 2022-06-10 MED ORDER — SUGAMMADEX SODIUM 500 MG/5ML IV SOLN
INTRAVENOUS | Status: DC | PRN
  Administered 2022-06-10: 400 mg via INTRAVENOUS

## 2022-06-10 MED ORDER — SODIUM CHLORIDE 0.9% IV SOLUTION: Freq: Once | INTRAVENOUS | Status: AC

## 2022-06-10 MED ORDER — ASPIRIN 81 MG PO TBEC: 81.0000 mg | DELAYED_RELEASE_TABLET | Freq: Every day | ORAL | Status: DC

## 2022-06-10 MED ORDER — ONDANSETRON HCL 4 MG/2ML IJ SOLN: 4.0000 mg | Freq: Four times a day (QID) | INTRAMUSCULAR | Status: DC | PRN

## 2022-06-10 MED ORDER — HEPARIN BOLUS VIA INFUSION
5000.0000 [IU] | Freq: Once | INTRAVENOUS | Status: AC
  Administered 2022-06-10: 5000 [IU] via INTRAVENOUS
  Filled 2022-06-10: qty 5000

## 2022-06-10 MED ORDER — OXYCODONE HCL 5 MG PO TABS
5.0000 mg | ORAL_TABLET | ORAL | Status: DC | PRN
  Administered 2022-06-16 – 2022-06-17 (×5): 10 mg via ORAL
  Filled 2022-06-10 (×6): qty 2

## 2022-06-10 MED ORDER — ALUM & MAG HYDROXIDE-SIMETH 200-200-20 MG/5ML PO SUSP
15.0000 mL | ORAL | Status: DC | PRN
  Administered 2022-06-16 – 2022-06-17 (×2): 30 mL via ORAL
  Filled 2022-06-10 (×2): qty 30

## 2022-06-10 MED ORDER — DEXAMETHASONE SODIUM PHOSPHATE 10 MG/ML IJ SOLN
INTRAMUSCULAR | Status: AC
  Filled 2022-06-10: qty 1

## 2022-06-10 MED ORDER — ACETAMINOPHEN 650 MG RE SUPP: 325.0000 mg | RECTAL | Status: DC | PRN

## 2022-06-10 MED ORDER — HYDROMORPHONE HCL 1 MG/ML IJ SOLN
0.5000 mg | INTRAMUSCULAR | Status: DC | PRN
  Administered 2022-06-10 – 2022-06-11 (×5): 1 mg via INTRAVENOUS
  Administered 2022-06-12 – 2022-06-13 (×2): 0.5 mg via INTRAVENOUS
  Administered 2022-06-13: 1 mg via INTRAVENOUS
  Administered 2022-06-14 (×3): 0.5 mg via INTRAVENOUS
  Administered 2022-06-14: 1 mg via INTRAVENOUS
  Administered 2022-06-15 – 2022-06-17 (×3): 0.5 mg via INTRAVENOUS
  Administered 2022-06-17 – 2022-06-18 (×2): 1 mg via INTRAVENOUS
  Filled 2022-06-10: qty 0.5
  Filled 2022-06-10 (×2): qty 1
  Filled 2022-06-10 (×2): qty 0.5
  Filled 2022-06-10 (×5): qty 1
  Filled 2022-06-10: qty 0.5
  Filled 2022-06-10: qty 1
  Filled 2022-06-10: qty 0.5
  Filled 2022-06-10 (×4): qty 1

## 2022-06-10 MED ORDER — PROMETHAZINE HCL 25 MG/ML IJ SOLN: 6.2500 mg | INTRAMUSCULAR | Status: DC | PRN

## 2022-06-10 MED ORDER — SENNOSIDES-DOCUSATE SODIUM 8.6-50 MG PO TABS
1.0000 | ORAL_TABLET | Freq: Every evening | ORAL | Status: DC | PRN
  Administered 2022-06-15: 1 via ORAL
  Filled 2022-06-10: qty 1

## 2022-06-10 MED ORDER — ATORVASTATIN CALCIUM 40 MG PO TABS
40.0000 mg | ORAL_TABLET | Freq: Every day | ORAL | Status: DC
  Administered 2022-06-10 – 2022-06-19 (×9): 40 mg via ORAL
  Filled 2022-06-10 (×9): qty 1

## 2022-06-10 MED ORDER — CEFAZOLIN SODIUM-DEXTROSE 1-4 GM/50ML-% IV SOLN
1.0000 g | Freq: Three times a day (TID) | INTRAVENOUS | Status: AC
  Administered 2022-06-10 – 2022-06-13 (×9): 1 g via INTRAVENOUS
  Filled 2022-06-10 (×11): qty 50

## 2022-06-10 SURGICAL SUPPLY — 53 items
BAG COUNTER SPONGE SURGICOUNT (BAG) ×5 IMPLANT
BOOT SUTURE VASCULAR YLW (MISCELLANEOUS) ×5
CANISTER SUCT 3000ML PPV (MISCELLANEOUS) ×5 IMPLANT
CATH EMB 2FR 60 (CATHETERS) ×1 IMPLANT
CATH EMB 3FR 80 (CATHETERS) ×1 IMPLANT
CATH EMB 4FR 80 (CATHETERS) ×1 IMPLANT
CATH EMB 5FR 80CM (CATHETERS) ×1 IMPLANT
CATH OMNI FLUSH 5F 65CM (CATHETERS) ×1 IMPLANT
CLAMP SUTURE YELLOW 5 PAIRS (MISCELLANEOUS) ×1 IMPLANT
CNTNR URN SCR LID CUP LEK RST (MISCELLANEOUS) ×2 IMPLANT
CONT SPEC 4OZ STRL OR WHT (MISCELLANEOUS) ×10
DERMABOND ADVANCED .7 DNX12 (GAUZE/BANDAGES/DRESSINGS) ×2 IMPLANT
DRAIN SNY 10X20 3/4 PERF (WOUND CARE) IMPLANT
DRAPE C-ARM 42X72 X-RAY (DRAPES) ×1 IMPLANT
DRAPE INCISE IOBAN 66X45 STRL (DRAPES) ×7 IMPLANT
ELECT REM PT RETURN 9FT ADLT (ELECTROSURGICAL) ×5
ELECTRODE REM PT RTRN 9FT ADLT (ELECTROSURGICAL) ×5 IMPLANT
EVACUATOR SILICONE 100CC (DRAIN) IMPLANT
GLOVE BIOGEL PI IND STRL 8 (GLOVE) ×5 IMPLANT
GOWN STRL REUS W/ TWL LRG LVL3 (GOWN DISPOSABLE) ×10 IMPLANT
GOWN STRL REUS W/TWL 2XL LVL3 (GOWN DISPOSABLE) ×10 IMPLANT
GOWN STRL REUS W/TWL LRG LVL3 (GOWN DISPOSABLE) ×10
HEMOSTAT SNOW SURGICEL 2X4 (HEMOSTASIS) ×1 IMPLANT
INSERT FOGARTY SM (MISCELLANEOUS) IMPLANT
KIT BASIN OR (CUSTOM PROCEDURE TRAY) ×5 IMPLANT
KIT TURNOVER KIT B (KITS) ×5 IMPLANT
NS IRRIG 1000ML POUR BTL (IV SOLUTION) ×10 IMPLANT
PACK PERIPHERAL VASCULAR (CUSTOM PROCEDURE TRAY) ×5 IMPLANT
PAD ARMBOARD 7.5X6 YLW CONV (MISCELLANEOUS) ×10 IMPLANT
SHEATH PINNACLE 5F 10CM (SHEATH) ×1 IMPLANT
STAPLER VISISTAT 35W (STAPLE) ×2 IMPLANT
STOPCOCK 4 WAY LG BORE MALE ST (IV SETS) ×1 IMPLANT
STRIP CLOSURE SKIN 1/2X4 (GAUZE/BANDAGES/DRESSINGS) IMPLANT
SURGIFLO W/THROMBIN 8M KIT (HEMOSTASIS) ×1 IMPLANT
SUT MNCRL AB 4-0 PS2 18 (SUTURE) ×10 IMPLANT
SUT PROLENE 5 0 C 1 24 (SUTURE) ×28 IMPLANT
SUT PROLENE 6 0 BV (SUTURE) ×7 IMPLANT
SUT SILK 2 0 PERMA HAND 18 BK (SUTURE) IMPLANT
SUT SILK 3 0 (SUTURE)
SUT SILK 3-0 18XBRD TIE 12 (SUTURE) IMPLANT
SUT VIC AB 2-0 CT1 27 (SUTURE) ×20
SUT VIC AB 2-0 CT1 TAPERPNT 27 (SUTURE) ×16 IMPLANT
SUT VIC AB 3-0 SH 27 (SUTURE) ×15
SUT VIC AB 3-0 SH 27X BRD (SUTURE) ×15 IMPLANT
SYR 20CC LL (SYRINGE) ×1 IMPLANT
SYR 3ML LL SCALE MARK (SYRINGE) ×4 IMPLANT
TAG SUTURE CLAMP YLW 5PR (MISCELLANEOUS) ×5
TOWEL GREEN STERILE (TOWEL DISPOSABLE) ×5 IMPLANT
TRAY FOLEY MTR SLVR 16FR STAT (SET/KITS/TRAYS/PACK) ×5 IMPLANT
TUBING EXTENTION W/L.L. (IV SETS) ×1 IMPLANT
WATER STERILE IRR 1000ML POUR (IV SOLUTION) ×5 IMPLANT
WIRE BENTSON .035X145CM (WIRE) ×1 IMPLANT
WIRE MICRO SET SILHO 5FR 7 (SHEATH) ×1 IMPLANT

## 2022-06-10 SURGICAL SUPPLY — 28 items
ADH SKN CLS APL DERMABOND .7 (GAUZE/BANDAGES/DRESSINGS) ×2
CANISTER SUCT 3000ML PPV (MISCELLANEOUS) ×2 IMPLANT
DERMABOND ADVANCED .7 DNX12 (GAUZE/BANDAGES/DRESSINGS) ×2 IMPLANT
DRESSING PEEL AND PLC PRVNA 13 (GAUZE/BANDAGES/DRESSINGS) IMPLANT
DRSG PEEL AND PLACE PREVENA 13 (GAUZE/BANDAGES/DRESSINGS) ×2
EVACUATOR SILICONE 100CC (DRAIN) IMPLANT
GLOVE BIOGEL PI IND STRL 8 (GLOVE) ×2 IMPLANT
GOWN STRL REUS W/ TWL LRG LVL3 (GOWN DISPOSABLE) ×4 IMPLANT
GOWN STRL REUS W/TWL 2XL LVL3 (GOWN DISPOSABLE) ×4 IMPLANT
GOWN STRL REUS W/TWL LRG LVL3 (GOWN DISPOSABLE) ×4
KIT BASIN OR (CUSTOM PROCEDURE TRAY) ×2 IMPLANT
KIT DRSG PREVENA PLUS 7DAY 125 (MISCELLANEOUS) IMPLANT
KIT TURNOVER KIT B (KITS) ×2 IMPLANT
NS IRRIG 1000ML POUR BTL (IV SOLUTION) ×2 IMPLANT
PACK PERIPHERAL VASCULAR (CUSTOM PROCEDURE TRAY) ×2 IMPLANT
PAD ARMBOARD 7.5X6 YLW CONV (MISCELLANEOUS) ×2 IMPLANT
POWDER MYRIAD MORCELLS 1000MG (Miscellaneous) IMPLANT
STAPLER VISISTAT 35W (STAPLE) IMPLANT
SUT MNCRL AB 4-0 PS2 18 (SUTURE) ×2 IMPLANT
SUT PROLENE 5 0 C 1 24 (SUTURE) ×2 IMPLANT
SUT PROLENE 6 0 BV (SUTURE) ×2 IMPLANT
SUT SILK 2 0 PERMA HAND 18 BK (SUTURE) IMPLANT
SUT VIC AB 2-0 CT1 27 (SUTURE) ×4
SUT VIC AB 2-0 CT1 TAPERPNT 27 (SUTURE) ×2 IMPLANT
SUT VIC AB 3-0 SH 27 (SUTURE) ×4
SUT VIC AB 3-0 SH 27X BRD (SUTURE) ×2 IMPLANT
TOWEL GREEN STERILE (TOWEL DISPOSABLE) ×2 IMPLANT
WATER STERILE IRR 1000ML POUR (IV SOLUTION) ×2 IMPLANT

## 2022-06-10 NOTE — Progress Notes (Signed)
ANTICOAGULATION CONSULT NOTE   Pharmacy Consult for Heparin Indication: acute iliac thrombosis  Allergies  Allergen Reactions   Codeine Nausea And Vomiting    Patient Measurements: Height: 6\' 4"  (193 cm) Weight: 108.9 kg (240 lb) IBW/kg (Calculated) : 86.8 Heparin Dosing Weight: 105 kg  Vital Signs: Temp: 98.6 F (37 C) (03/24 1400) Temp Source: Oral (03/24 1400) BP: 116/75 (03/24 1340) Pulse Rate: 96 (03/24 1325)  Labs: Recent Labs    06/10/22 0005 06/10/22 0011 06/10/22 0651 06/10/22 0900 06/10/22 0958 06/10/22 1211 06/10/22 1421  HGB 13.2 15.3 12.6*  --  10.2* 8.8*  --   HCT 38.4* 45.0 37.0*  --  30.0* 26.0*  --   PLT 66*  --   --   --  60*  --   --   LABPROT 16.5*  --   --   --   --   --   --   INR 1.3*  --   --   --   --   --   --   HEPARINUNFRC  --   --   --  >1.10* 1.05*  --  0.35  CREATININE 0.99 0.90  --   --  1.01  --   --      Estimated Creatinine Clearance: 96 mL/min (by C-G formula based on SCr of 1.01 mg/dL).   Medical History: Past Medical History:  Diagnosis Date   A-fib ([place])    Acute ischemic stroke ([place])    Asthma    Gout    Hypertension    MRSA (methicillin resistant staph aureus) culture positive 2009    Medications:  No current facility-administered medications on file prior to encounter.   Current Outpatient Medications on File Prior to Encounter  Medication Sig Dispense Refill   acetaminophen (TYLENOL) 500 MG tablet Take 500 mg by mouth every 6 (six) hours as needed for headache.     albuterol (PROVENTIL HFA;VENTOLIN HFA) 108 (90 Base) MCG/ACT inhaler Inhale 2 puffs into the lungs every 6 (six) hours as needed for wheezing or shortness of breath. 1 Inhaler 12   atorvastatin (LIPITOR) 40 MG tablet Take 1 tablet (40 mg total) by mouth daily. 90 tablet 0   colchicine 0.6 MG tablet Take 1 tablet (0.6 mg total) by mouth daily. 30 tablet 2   cyclobenzaprine (FLEXERIL) 10 MG tablet Take 1 tablet (10 mg total) by mouth 3 (three) times  daily as needed for muscle spasms. 30 tablet 2   lisinopril (PRINIVIL,ZESTRIL) 10 MG tablet Take 1 tablet (10 mg total) by mouth daily. 90 tablet 1   metoprolol succinate (TOPROL-XL) 25 MG 24 hr tablet Take 1 tablet (25 mg total) by mouth daily. 30 tablet 2   rivaroxaban (XARELTO) 20 MG TABS tablet Take 1 tablet (20 mg total) by mouth daily with supper. 30 tablet 2     Assessment: 67 y.o. male with h/o Afib previously on Xarelto but no longer taking, admitted with leg pain and iliac thombosis, for heparin  Heparin level drawn right after 1st procedure this morning > 1.1, however drawn shortly after receiving 12,000 units of heparin in procedure.  Heparin then turned off upon return to OR for hematoma.  Pharmacy asked to resume after procedure.  Level drawn prior to restart = 0.35 (within goal range).  Goal of Therapy:  Heparin level 0.3-0.7 units/ml Monitor platelets by anticoagulation protocol: Yes   Plan:  Restart heparin gtt at 1500 units/hr. Check heparin level in 6 hrs. Daily heparin level  and CBC.  [place] Crane, Roylene Reason, BCCP Clinical Pharmacist  06/10/2022 3:29 PM   [facility] pharmacy phone numbers are listed on [email or web address]

## 2022-06-10 NOTE — Anesthesia Procedure Notes (Signed)
Procedure Name: Intubation Date/Time: 06/10/2022 12:17 PM  Performed by: Darletta Moll, CRNAPre-anesthesia Checklist: Patient identified, Emergency Drugs available, Suction available and Patient being monitored Patient Re-evaluated:Patient Re-evaluated prior to induction Oxygen Delivery Method: Circle system utilized Preoxygenation: Pre-oxygenation with 100% oxygen Induction Type: IV induction Ventilation: Mask ventilation without difficulty Laryngoscope Size: 4 and Glidescope Grade View: Grade I Tube type: Oral Tube size: 7.5 mm Number of attempts: 1 Airway Equipment and Method: Stylet and Oral airway Placement Confirmation: ETT inserted through vocal cords under direct vision, positive ETCO2 and breath sounds checked- equal and bilateral Secured at: 23 cm Tube secured with: Tape Dental Injury: Teeth and Oropharynx as per pre-operative assessment

## 2022-06-10 NOTE — ED Triage Notes (Signed)
patient arrives via ems from home secondary to bil leg pain and numbness of both feet that irradiates all the way to the knee. Hx stroke , no deficit.

## 2022-06-10 NOTE — Op Note (Signed)
NAME: [name] [name]    MRN: [id number] DOB: [birth year]    DATE OF OPERATION: 06/10/2022  PREOP DIAGNOSIS:    Acute aortic occlusion, bilateral lower extremity ischemia  POSTOP DIAGNOSIS:    Same  PROCEDURE:    Aortic embolectomy Bilateral iliofemoral embolectomy Bilateral femoral-popliteal embolectomy Bilateral 4 compartment fasciotomy Aortogram Bilateral lower extremity angiogram  SURGEON: Broadus John  ASSIST: Paulo Fruit, PA  ANESTHESIA: General   EBL: 631ml  INDICATIONS:    [name] [name] is a 67 y.o. male patient is a 67 year old male with history of A-fib, noncompliant with anticoagulation who presented with right lower extremity sensorimotor deficits.  CT demonstrated aortic occlusion, right-sided iliac occlusion with thrombus extending into the common femoral, profunda, superficial femoral artery.  On the left side, there was thrombus in the common femoral artery, superficial femoral artery, profunda.  The runoff could not be evaluated due to contrast timing.  After discussing the risk and benefits of lower extremity revascularization including 4 compartment fasciotomies bilaterally, greater elected to proceed.  FINDINGS:   Large amount of chronic appearing thrombus embolectomies from the aorta, bilateral iliacs, common femoral arteries, profunda, superficial femoral arteries, popliteal arteries. Post-embolectomy aortogram: Sluggish flow-indicative of heart failure, widely patent inflow, disease appreciated in bilateral superficial femoral arteries, popliteal arteries, single-vessel posterior tibial runoff bilaterally  TECHNIQUE:   Patient is brought to the OR laid in supine position.  General anesthesia was induced and the patient was prepped and draped in standard fashion.  The case began with ultrasound insonation of bilateral common femoral arteries.  Bifurcations were marked and oblique incisions were made.  These were carried down to the common  femoral artery and the common femoral artery, superficial femoral artery, profunda were controlled with the use of Vesseloops.  Next, the patient was heparinized to an ACT greater than 250 for the remainder of the case.  A transverse arteriotomy was made first on the right common femoral artery followed by Fogarty embolectomy of the aortoiliac segment using a #4 Fogarty.  There was a significant amount of chronic appearing thrombus which was pulled from the aortoiliac embolectomy.  After multiple clean passes, I moved to the profunda as well as the superficial femoral artery.  Thrombus was removed from both.  I made sure there were several clean passes prior to completion.  I could tell by the way the balloon passed, there was significant disease within the superficial femoral artery.  There was excellent inflow bleeding, and backbleeding from the profunda and superficial femoral arteries.   Next, I moved to the left side, and again performed a transverse arteriotomy followed by embolectomy.  Initially, I had trouble advancing the #4 Fogarty beyond 10 cm.  I sized it down to a #3 and was able to pass proximally.  After several pools which were productive, a large plug exited which appeared to be the thrombus that was lodged at the bifurcation.  Next, the profunda was embolectomized, as well as the SFA popliteal arteries.  There was thrombus return and excellent backbleeding from all.  After multiple clean passes, bilateral common femoral arteries were closed with interrupted 5-0 Prolene suture.  Next, I moved to assess distal flow with use of a Doppler.  There is a weak right-sided PT signal and no signal appreciated on the left side.  At this point, I called for fluoroscopy to perform bilateral lower extremity angiograms.  In the interim, I performed bilateral 4 compartment fasciotomies with incisions both medially and laterally measuring  20 cm each.  From the medial approach, I uncinated the posterior tibial  artery and found that there was an excellent pulse, with a weaker Doppler signal, consistent with a poor Doppler.  Regardless, radiology was present.  The hybrid room was not functioning, therefore I used the C arm.  The superficial femoral artery was accessed bilaterally using a micropuncture needle and wire and right and left leg angiograms followed through a 5 French micro sheath.  This demonstrated disease within the superficial femoral artery, popliteal artery, but single-vessel inline flow to the level of the foot via the posterior tibial artery.  After seeing this, the arteriotomies were closed with use of 6-0 Prolene suture, and I elected to perform an aortogram to ensure sluggish flow was due to heart failure not inflow disease.  The left common femoral artery was accessed with a micropuncture needle and wire and this was upsized to a 5 [place] sheath.  Omni Flush catheter followed and hand-injection followed.  There was no inflow disease appreciated the right hypogastric artery was occluded but reconstituted via pelvic collaterals.  At this point, I elected to keep the heparin running, as I was concerned this was from a cardioembolic event, and the patient had not undergone an echocardiogram.  Bilateral groins were irrigated with saline, and closed in layers using 2-0 Vicryl suture with Monocryl at the level of the skin.  Bilateral 4 compartment fasciotomies were closed using Vesseloops in Roman sandal fashion.  He will require return to OR in the coming days for fasciotomy closure.  At case completion, the patient had signals in bilateral posterior tibial arteries.  Macie Burows, MD [facility] DATE OF DICTATION:   06/10/2022

## 2022-06-10 NOTE — Transfer of Care (Signed)
Immediate Anesthesia Transfer of Care Note  Patient: [name] [name]  Procedure(s) Performed: Virl Son EXPOSURE (Right) APPLICATION OF WOUND VAC WITH MYRIAD MORCELLS (Right: Groin) EVACUATION LEFT GROIN  HEMATOMA (Right: Groin)  Patient Location: PACU  Anesthesia Type:General  Level of Consciousness: drowsy and patient cooperative  Airway & Oxygen Therapy: Patient Spontanous Breathing  Post-op Assessment: Report given to RN, Post -op Vital signs reviewed and stable, and Patient moving all extremities X 4  Post vital signs: Reviewed and stable  Last Vitals:  Vitals Value Taken Time  BP 103/61 06/10/22 1309  Temp    Pulse 100 06/10/22 1313  Resp 19 06/10/22 1313  SpO2 93 % 06/10/22 1313  Vitals shown include unvalidated device data.  Last Pain:  Vitals:   06/10/22 0800  TempSrc:   PainSc: 0-No pain         Complications: No notable events documented.

## 2022-06-10 NOTE — Anesthesia Preprocedure Evaluation (Addendum)
Anesthesia Evaluation  Patient identified by MRN, date of birth, ID band Patient awake    Reviewed: Allergy & Precautions, NPO status , Patient's Chart, lab work & pertinent test results  Airway Mallampati: II  TM Distance: >3 FB Neck ROM: Full    Dental  (+) Poor Dentition, Loose,    Pulmonary asthma , former smoker   Pulmonary exam normal        Cardiovascular hypertension, + Peripheral Vascular Disease  + dysrhythmias Atrial Fibrillation  Rhythm:Regular Rate:Normal  ECHO 2019: Study Conclusions  - Left ventricle: Abnormal septal motion mid and basal inferior    wall hypokinesis The cavity size was normal. Wall thickness was    normal. Systolic function was mildly reduced. The estimated    ejection fraction was in the range of 45% to 50%. Wall motion was    normal; there were no regional wall motion abnormalities. The    study is not technically sufficient to allow evaluation of LV    diastolic function.  - Aortic valve: Sclerosis without stenosis.  - Mitral valve: Calcified annulus. Mildly thickened leaflets .    There was mild regurgitation.  - Left atrium: The atrium was mildly to moderately dilated.  - Right atrium: The atrium was mildly to moderately dilated.  - Atrial septum: No defect or patent foramen ovale was identified.  - Tricuspid valve: There was moderate regurgitation.     Neuro/Psych CVA, No Residual Symptoms  negative psych ROS   GI/Hepatic negative GI ROS, Neg liver ROS,,,  Endo/Other  negative endocrine ROS    Renal/GU negative Renal ROS  negative genitourinary   Musculoskeletal negative musculoskeletal ROS (+)    Abdominal Normal abdominal exam  (+)   Peds  Hematology negative hematology ROS (+)   Anesthesia Other Findings  1. Diffuse vascular thrombosis with near complete occlusion of the right lower extremity extending from the origin of the common iliac artery through to the runoff  vessels. A short segment of reconstituted flow is demonstrated in the common femoral artery. No significant collateral vessels are present. This is likely acute thrombosis. 2. Thrombosis of the left superficial femoral artery with short segment of reconstitution of the popliteal artery. No flow demonstrated in the runoff vessels. No significant collateral vessels are present. This is likely acute thrombosis. 3. Aortic atherosclerosis.   Reproductive/Obstetrics                             Anesthesia Physical Anesthesia Plan  ASA: 3 and emergent  Anesthesia Plan: General   Post-op Pain Management:    Induction: Intravenous  PONV Risk Score and Plan: 2 and Ondansetron, Dexamethasone, Midazolam and Treatment may vary due to age or medical condition  Airway Management Planned: Mask and Oral ETT  Additional Equipment: Arterial line  Intra-op Plan:   Post-operative Plan: Possible Post-op intubation/ventilation  Informed Consent: I have reviewed the patients History and Physical, chart, labs and discussed the procedure including the risks, benefits and alternatives for the proposed anesthesia with the patient or authorized representative who has indicated his/her understanding and acceptance.     Dental advisory given  Plan Discussed with: CRNA  Anesthesia Plan Comments: (Lab Results      Component                Value               Date  WBC                      8.5                 06/10/2022                HGB                      15.3                06/10/2022                HCT                      45.0                06/10/2022                MCV                      100.8 (H)           06/10/2022                PLT                      66 (L)              06/10/2022             Lab Results      Component                Value               Date                      NA                       139                 06/10/2022                 K                        3.4 (L)             06/10/2022                CO2                      20 (L)              06/10/2022                GLUCOSE                  143 (H)             06/10/2022                BUN                      17                  06/10/2022                CREATININE  0.90                06/10/2022                CALCIUM                  9.3                 06/10/2022                GFRNONAA                 >60                 06/10/2022           )       Anesthesia Quick Evaluation

## 2022-06-10 NOTE — Progress Notes (Signed)
ANTICOAGULATION CONSULT NOTE - Initial Consult  Pharmacy Consult for Heparin Indication: acute iliac thrombosis  Allergies  Allergen Reactions   Codeine Nausea And Vomiting    Patient Measurements: Height: 6\' 4"  (193 cm) Weight: 108.9 kg (240 lb) IBW/kg (Calculated) : 86.8 Heparin Dosing Weight: 105 kg  Vital Signs: Temp: 98.3 F (36.8 C) (03/24 0004) Temp Source: Oral (03/24 0004) BP: 173/123 (03/24 0006) Pulse Rate: 128 (03/24 0004)  Labs: Recent Labs    06/10/22 0011  HGB 15.3  HCT 45.0  CREATININE 0.90    Estimated Creatinine Clearance: 107.7 mL/min (by C-G formula based on SCr of 0.9 mg/dL).   Medical History: Past Medical History:  Diagnosis Date   A-fib ([place])    Acute ischemic stroke ([place])    Asthma    Gout    Hypertension    MRSA (methicillin resistant staph aureus) culture positive 2009    Medications:  No current facility-administered medications on file prior to encounter.   Current Outpatient Medications on File Prior to Encounter  Medication Sig Dispense Refill   acetaminophen (TYLENOL) 500 MG tablet Take 500 mg by mouth every 6 (six) hours as needed for headache.     albuterol (PROVENTIL HFA;VENTOLIN HFA) 108 (90 Base) MCG/ACT inhaler Inhale 2 puffs into the lungs every 6 (six) hours as needed for wheezing or shortness of breath. 1 Inhaler 12   atorvastatin (LIPITOR) 40 MG tablet Take 1 tablet (40 mg total) by mouth daily. 90 tablet 0   colchicine 0.6 MG tablet Take 1 tablet (0.6 mg total) by mouth daily. 30 tablet 2   cyclobenzaprine (FLEXERIL) 10 MG tablet Take 1 tablet (10 mg total) by mouth 3 (three) times daily as needed for muscle spasms. 30 tablet 2   lisinopril (PRINIVIL,ZESTRIL) 10 MG tablet Take 1 tablet (10 mg total) by mouth daily. 90 tablet 1   metoprolol succinate (TOPROL-XL) 25 MG 24 hr tablet Take 1 tablet (25 mg total) by mouth daily. 30 tablet 2   rivaroxaban (XARELTO) 20 MG TABS tablet Take 1 tablet (20 mg total) by mouth  daily with supper. 30 tablet 2     Assessment: 67 y.o. male with h/o Afib previously on Xarelto but no longer taking, admitted with leg pain and iliac thombosis, for heparin  Goal of Therapy:  Heparin level 0.3-0.7 units/ml Monitor platelets by anticoagulation protocol: Yes   Plan:  Heparin 5000 units IV bolus, then start heparin 1700 units/hr Check heparin level in 6 hours.   Saburo, Teaff 06/10/2022,12:38 AM

## 2022-06-10 NOTE — Progress Notes (Signed)
Doing well postop PT signals Motor returned right leg. Continues to have paraesthesias below the knee.  Continue heparin gtt Asa81mg  daily.   Needs echo cardiogram  [name] John Md

## 2022-06-10 NOTE — ED Provider Notes (Signed)
[facility] Provider Note   CSN: [id number] Arrival date & time: 06/09/22  2359     History  Chief Complaint  Patient presents with   Numbness    [name] [name] is a 67 y.o. male.  The history is provided by the patient, the EMS personnel and medical records.  [name] [name] is a 67 y.o. male who presents to the Emergency Department complaining of numbness and leg pain.  He presents to the emergency department by EMS for evaluation of abrupt onset numbness and leg pain.  He states that he was in his routine state of health at 930.  Then around 10-10 30 he had immediate sharp pain in his right thigh/groin area and then developed numbness to bilateral feet to the knees.  He states that the right leg is more affected than the left.  No prior similar symptoms.  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  He does use tobacco and drinks alcohol regularly.  No alcohol today.  He uses occasional marijuana.  No reported injuries.  No chest pain, difficulty breathing, nausea, vomiting.     Home Medications Prior to Admission medications   Medication Sig Start Date End Date Taking? Authorizing Provider  acetaminophen (TYLENOL) 500 MG tablet Take 500 mg by mouth every 6 (six) hours as needed for headache.    [provider]  albuterol (PROVENTIL HFA;VENTOLIN HFA) 108 (90 Base) MCG/ACT inhaler Inhale 2 puffs into the lungs every 6 (six) hours as needed for wheezing or shortness of breath. 04/07/18   Azzie Glatter, FNP  atorvastatin (LIPITOR) 40 MG tablet Take 1 tablet (40 mg total) by mouth daily. 04/07/18 07/06/18  Azzie Glatter, FNP  colchicine 0.6 MG tablet Take 1 tablet (0.6 mg total) by mouth daily. 04/07/18   Azzie Glatter, FNP  cyclobenzaprine (FLEXERIL) 10 MG tablet Take 1 tablet (10 mg total) by mouth 3 (three) times daily as needed for muscle spasms. 04/07/18   Azzie Glatter, FNP   lisinopril (PRINIVIL,ZESTRIL) 10 MG tablet Take 1 tablet (10 mg total) by mouth daily. 04/07/18   Azzie Glatter, FNP  metoprolol succinate (TOPROL-XL) 25 MG 24 hr tablet Take 1 tablet (25 mg total) by mouth daily. 04/07/18   Azzie Glatter, FNP  rivaroxaban (XARELTO) 20 MG TABS tablet Take 1 tablet (20 mg total) by mouth daily with supper. 04/07/18   Azzie Glatter, FNP      Allergies    Codeine    Review of Systems   Review of Systems  All other systems reviewed and are negative.   Physical Exam Updated Vital Signs BP (!) 178/114   Pulse 94   Temp 98.3 F (36.8 C) (Oral)   Resp 13   Ht 6\' 4"  (1.93 m)   Wt 108.9 kg   SpO2 98%   BMI 29.21 kg/m  Physical Exam Vitals and nursing note reviewed.  Constitutional:      Appearance: He is well-developed.  HENT:     Head: Normocephalic and atraumatic.  Cardiovascular:     Rate and Rhythm: Tachycardia present. Rhythm irregular.     Heart sounds: No murmur heard. Pulmonary:     Effort: Pulmonary effort is normal. No respiratory distress.     Breath sounds: Normal breath sounds.  Abdominal:     Palpations: Abdomen is soft.     Tenderness: There is no abdominal tenderness. There  is no guarding or rebound.  Musculoskeletal:        General: No swelling or tenderness.     Comments: No appreciable tenderness to palpation on examination.  Bilateral feet are cool with delayed cap refill.  Unable to palpate pedal pulses bilaterally.  He does have faint femoral pulses bilaterally.  Skin:    General: Skin is warm and dry.  Neurological:     Mental Status: He is alert and oriented to person, place, and time.     Comments: 5 out of 5 strength in bilateral upper extremities.  He has 5 out of 5 strength in bilateral proximal lower extremities.  He does have absent dorsiflexion plantarflexion in the right foot.  He is able to weakly dorsiflex and plantarflex at the left foot.  Altered sensation to light touch to bilateral feet   Psychiatric:        Behavior: Behavior normal.     ED Results / Procedures / Treatments   Labs (all labs ordered are listed, but only abnormal results are displayed) Labs Reviewed  CBC WITH DIFFERENTIAL/PLATELET - Abnormal; Notable for the following components:      Result Value   RBC 3.81 (*)    HCT 38.4 (*)    MCV 100.8 (*)    MCH 34.6 (*)    RDW 15.8 (*)    Platelets 66 (*)    All other components within normal limits  COMPREHENSIVE METABOLIC PANEL - Abnormal; Notable for the following components:   Potassium 3.4 (*)    CO2 20 (*)    Glucose, Bld 142 (*)    Albumin 3.2 (*)    AST 157 (*)    ALT 114 (*)    Total Bilirubin 1.7 (*)    All other components within normal limits  PROTIME-INR - Abnormal; Notable for the following components:   Prothrombin Time 16.5 (*)    INR 1.3 (*)    All other components within normal limits  I-STAT CHEM 8, ED - Abnormal; Notable for the following components:   Potassium 3.4 (*)    Glucose, Bld 143 (*)    TCO2 20 (*)    All other components within normal limits  MAGNESIUM  HEPARIN LEVEL (UNFRACTIONATED)    EKG EKG Interpretation  Date/Time:  Sunday June 10 2022 00:03:33 EDT Ventricular Rate:  104 PR Interval:    QRS Duration: 100 QT Interval:  331 QTC Calculation: 436 R Axis:   2 Text Interpretation: Atrial fibrillation Inferior infarct, old Confirmed by Quintella Reichert [phone]) on 06/10/2022 12:36:43 AM  Radiology CT Head Wo Contrast  Result Date: 06/10/2022 CLINICAL DATA:  Acute neurological deficit with stroke suspected. New onset atrial fibrillation. EXAM: CT HEAD WITHOUT CONTRAST TECHNIQUE: Contiguous axial images were obtained from the base of the skull through the vertex without intravenous contrast. RADIATION DOSE REDUCTION: This exam was performed according to the departmental dose-optimization program which includes automated exposure control, adjustment of the mA and/or kV according to patient size and/or use of  iterative reconstruction technique. COMPARISON:  MRI brain 02/21/2018.  CT head 02/20/2018 FINDINGS: Brain: No evidence of acute infarction, hemorrhage, hydrocephalus, extra-axial collection or mass lesion/mass effect. Vascular: Possible asymmetrical increased density in the right middle cerebral artery which could indicate acute thrombus although no parenchymal changes are identified. Consider MRI for further evaluation if clinically indicated. No mass-effect or midline shift. No abnormal extra-axial fluid collections. Gray-white matter junctions are distinct. Basal cisterns are not effaced. No ventricular dilatation. No acute intracranial hemorrhage.  Skull: Calvarium appears intact. Sinuses/Orbits: Paranasal sinuses and mastoid air cells are clear. Other: None. IMPRESSION: 1. Possible asymmetrical increased density in the right middle cerebral artery possibly representing acute thrombus although no parenchymal changes are identified. Consider CTA or MRI for further evaluation. 2. Otherwise, no acute intracranial abnormality is suggested. These results were called by telephone at the time of interpretation on 06/10/2022 at 1:13 am to provider [facility] , who verbally acknowledged these results. Electronically Signed   By: Lucienne Capers M.D.   On: 06/10/2022 01:15   CT Angio Aortobifemoral W and/or Wo Contrast  Result Date: 06/10/2022 CLINICAL DATA:  Claudication or leg ischemia with bilateral lower extremity weakness and numbness, right greater than left. New onset atrial fibrillation. EXAM: CT ANGIOGRAPHY OF ABDOMINAL AORTA WITH ILIOFEMORAL RUNOFF TECHNIQUE: Multidetector CT imaging of the abdomen, pelvis and lower extremities was performed using the standard protocol during bolus administration of intravenous contrast. Multiplanar CT image reconstructions and MIPs were obtained to evaluate the vascular anatomy. RADIATION DOSE REDUCTION: This exam was performed according to the departmental  dose-optimization program which includes automated exposure control, adjustment of the mA and/or kV according to patient size and/or use of iterative reconstruction technique. CONTRAST:  100 mL Omnipaque 350 COMPARISON:  None Available. FINDINGS: VASCULAR Aorta: Diffuse aortic calcification. No aneurysm or dissection. Abdominal aorta is patent to the bifurcation. Celiac: Patent without evidence of aneurysm, dissection, vasculitis or significant stenosis. SMA: Patent without evidence of aneurysm, dissection, vasculitis or significant stenosis. Renals: Both renal arteries are patent without evidence of aneurysm, dissection, vasculitis, fibromuscular dysplasia or significant stenosis. IMA: Patent without evidence of aneurysm, dissection, vasculitis or significant stenosis. RIGHT Lower Extremity Inflow: Diffuse thrombosis is demonstrated with absence of flow beginning at the origin of the common iliac artery and involving the entire common iliac, internal iliac, and external iliac arteries. Outflow: There is a short segment of reconstituted flow in the common femoral artery but there is subsequent complete occlusion with thrombosis of the common femoral artery. The deep femoral artery is completely thrombosed without flow demonstrated. There is segmental flow in the superficial femoral artery with about 70% stenosis throughout its length. The popliteal artery is completely thrombosed with no flow demonstrated. Runoff: No flow is demonstrated in the runoff vessels. LEFT Lower Extremity Inflow: There is focal thrombus at the origin of the common iliac artery with flow demonstrated around the thrombus. The common iliac artery, internal and external iliac arteries are patent without significant stenosis. Outflow: The common femoral artery is completely thrombosed with no flow demonstrated in the common femoral artery. No flow is demonstrated in the proximal and mid superficial femoral artery. There is reconstitution of flow  to the distal superficial femoral artery. Flow is demonstrated in the popliteal artery with vascular narrowing of 70% or greater stenosis. The deep femoral artery is patent. Runoff: No flow is demonstrated in the runoff vessels. Veins: No obvious venous abnormality within the limitations of this arterial phase study. Review of the MIP images confirms the above findings. NON-VASCULAR Lower chest: Lung bases are clear. Hepatobiliary: Hepatic cirrhosis with diffuse parenchymal atrophy, nodular parenchymal pattern with nodular contour, and enlarged lateral segment left and caudate lobes of the liver. No focal lesions are identified. Cholelithiasis without inflammatory change. No bile duct dilatation. Pancreas: Unremarkable. No pancreatic ductal dilatation or surrounding inflammatory changes. Spleen: Normal in size without focal abnormality. Adrenals/Urinary Tract: No adrenal gland nodules. Nephrograms are heterogeneous. This may indicate renal infarcts bilaterally. However, this determination is limited on  the early arterial phase with incomplete characterization of the parenchymal enhancement. No hydronephrosis or hydroureter. Bladder is normal. Stomach/Bowel: Stomach, small bowel, and colon are not abnormally distended. No wall thickening or inflammatory changes are appreciated. Appendix is normal. Lymphatic: No significant lymphadenopathy. Reproductive: Prostate is unremarkable. Other: No free air or free fluid in the abdomen. Small periumbilical hernia containing fat. Musculoskeletal: Degenerative changes in the spine. IMPRESSION: VASCULAR 1. Diffuse vascular thrombosis with near complete occlusion of the right lower extremity extending from the origin of the common iliac artery through to the runoff vessels. A short segment of reconstituted flow is demonstrated in the common femoral artery. No significant collateral vessels are present. This is likely acute thrombosis. 2. Thrombosis of the left superficial femoral  artery with short segment of reconstitution of the popliteal artery. No flow demonstrated in the runoff vessels. No significant collateral vessels are present. This is likely acute thrombosis. 3. Aortic atherosclerosis. NON-VASCULAR 1. 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 2. Hepatic cirrhosis. 3. Cholelithiasis without evidence of acute cholecystitis. Critical Value/emergent results were called by telephone at the time of interpretation on 06/10/2022 at 12:56 am to provider [facility] , who verbally acknowledged these results. Electronically Signed   By: Lucienne Capers M.D.   On: 06/10/2022 01:10   DG Chest Port 1 View  Result Date: 06/10/2022 CLINICAL DATA:  Weakness EXAM: PORTABLE CHEST 1 VIEW COMPARISON:  02/20/2018 FINDINGS: Heart size and pulmonary vascularity are normal. Lungs are clear. No pleural effusions. No pneumothorax. Mediastinal contours appear intact. Old fracture deformity of the right clavicle. No change since prior study. IMPRESSION: No active disease. Electronically Signed   By: Lucienne Capers M.D.   On: 06/10/2022 00:47    Procedures .Critical Care  Performed by: Quintella Reichert, MD Authorized by: Quintella Reichert, MD   Critical care provider statement:    Critical care time (minutes):  50   Critical care time was exclusive of:  Separately billable procedures and treating other patients   Critical care was necessary to treat or prevent imminent or life-threatening deterioration of the following conditions:  Circulatory failure   Critical care was time spent personally by me on the following activities:  Discussions with consultants, evaluation of patient's response to treatment, examination of patient, development of treatment plan with patient or surrogate, ordering and review of laboratory studies, ordering and review of radiographic studies, re-evaluation of patient's condition, review of old charts and obtaining history from patient or  surrogate   Care discussed with: admitting provider       Medications Ordered in ED Medications  heparin ADULT infusion 100 units/mL (25000 units/276mL) (1,700 Units/hr Intravenous Continued from Pre-op 06/10/22 0244)  fentaNYL (SUBLIMAZE) injection 50 mcg (50 mcg Intravenous Given 06/10/22 0011)  heparin bolus via infusion 5,000 Units (5,000 Units Intravenous Bolus from Bag 06/10/22 0056)  iohexol (OMNIPAQUE) 350 MG/ML injection 100 mL (100 mLs Intravenous Contrast Given 06/10/22 0056)  HYDROmorphone (DILAUDID) injection 1 mg (1 mg Intravenous Given 06/10/22 0138)    ED Course/ Medical Decision Making/ A&P                             Medical Decision Making Amount and/or Complexity of Data Reviewed Labs: ordered. Radiology: ordered.  Risk Prescription drug management. Decision regarding hospitalization.   Patient here for evaluation of acute right leg pain with bilateral lower extremity numbness.  He does have decreased perfusion to bilateral lower extremities with  absent pedal pulses bilaterally.  He does have palpable femoral pulses bilaterally.  He is currently in atrial fibrillation, this is predominantly rate controlled with heart rate around 90s.  He does have a history of atrial fibrillation but does not take any medications, concern for acute thrombotic process and a CTA aorto bi fem was obtained.  CTA does demonstrate acute aortic bifurcation occlusion.  Patient was started on heparin.  Discussed CTA results with radiologist on-call.  Also discussed findings with patient.  Dr. Unk Lightning with vascular surgery consulted-plan to take to the OR for further intervention.  CT head was obtained given patient's history of CVA, alcohol use and concern for acute lower limb ischemia.  CT head has dense MCA sign.  Current clinical examination is not consistent with acute CVA and will not pursue additional workup for these findings at this time.  Labs significant for thrombocytopenia-patient with  history of same in the past, slightly downtrending compared to previous.  No active bleeding at this time.       Final Clinical Impression(s) / ED Diagnoses Final diagnoses:  Acute lower limb ischemia  Atrial fibrillation with controlled ventricular rate [facility])    Rx / DC Orders ED Discharge Orders     None         Quintella Reichert, MD 06/10/22 [phone]

## 2022-06-10 NOTE — Social Work (Signed)
Resources added. TOC will stand by for any other needs.

## 2022-06-10 NOTE — Anesthesia Preprocedure Evaluation (Signed)
Anesthesia Evaluation  Patient identified by MRN, date of birth, ID band Patient awake    Reviewed: Allergy & Precautions, NPO status , Patient's Chart, lab work & pertinent test results  Airway Mallampati: II  TM Distance: >3 FB Neck ROM: Full    Dental  (+) Poor Dentition, Loose,    Pulmonary asthma , former smoker   Pulmonary exam normal        Cardiovascular hypertension, + Peripheral Vascular Disease  + dysrhythmias Atrial Fibrillation  Rhythm:Regular Rate:Normal  ECHO 2019: Study Conclusions  - Left ventricle: Abnormal septal motion mid and basal inferior    wall hypokinesis The cavity size was normal. Wall thickness was    normal. Systolic function was mildly reduced. The estimated    ejection fraction was in the range of 45% to 50%. Wall motion was    normal; there were no regional wall motion abnormalities. The    study is not technically sufficient to allow evaluation of LV    diastolic function.  - Aortic valve: Sclerosis without stenosis.  - Mitral valve: Calcified annulus. Mildly thickened leaflets .    There was mild regurgitation.  - Left atrium: The atrium was mildly to moderately dilated.  - Right atrium: The atrium was mildly to moderately dilated.  - Atrial septum: No defect or patent foramen ovale was identified.  - Tricuspid valve: There was moderate regurgitation.     Neuro/Psych CVA, No Residual Symptoms  negative psych ROS   GI/Hepatic negative GI ROS, Neg liver ROS,,,  Endo/Other  negative endocrine ROS    Renal/GU negative Renal ROS  negative genitourinary   Musculoskeletal negative musculoskeletal ROS (+)    Abdominal Normal abdominal exam  (+)   Peds  Hematology negative hematology ROS (+)   Anesthesia Other Findings  1. Diffuse vascular thrombosis with near complete occlusion of the right lower extremity extending from the origin of the common iliac artery through to the runoff  vessels. A short segment of reconstituted flow is demonstrated in the common femoral artery. No significant collateral vessels are present. This is likely acute thrombosis. 2. Thrombosis of the left superficial femoral artery with short segment of reconstitution of the popliteal artery. No flow demonstrated in the runoff vessels. No significant collateral vessels are present. This is likely acute thrombosis. 3. Aortic atherosclerosis.   Reproductive/Obstetrics                              Anesthesia Physical Anesthesia Plan  ASA: 4 and emergent  Anesthesia Plan: General   Post-op Pain Management: Minimal or no pain anticipated   Induction: Intravenous  PONV Risk Score and Plan: 2 and Ondansetron, Dexamethasone, Midazolam and Treatment may vary due to age or medical condition  Airway Management Planned: Oral ETT  Additional Equipment: Arterial line  Intra-op Plan:   Post-operative Plan: Possible Post-op intubation/ventilation  Informed Consent: I have reviewed the patients History and Physical, chart, labs and discussed the procedure including the risks, benefits and alternatives for the proposed anesthesia with the patient or authorized representative who has indicated his/her understanding and acceptance.     Dental advisory given  Plan Discussed with: CRNA  Anesthesia Plan Comments: (Lab Results      Component                Value               Date  WBC                      8.5                 06/10/2022                HGB                      15.3                06/10/2022                HCT                      45.0                06/10/2022                MCV                      100.8 (H)           06/10/2022                PLT                      66 (L)              06/10/2022             Lab Results      Component                Value               Date                      NA                       139                  06/10/2022                K                        3.4 (L)             06/10/2022                CO2                      20 (L)              06/10/2022                GLUCOSE                  143 (H)             06/10/2022                BUN                      17                  06/10/2022                CREATININE  0.90                06/10/2022                CALCIUM                  9.3                 06/10/2022                GFRNONAA                 >60                 06/10/2022           )        Anesthesia Quick Evaluation

## 2022-06-10 NOTE — Transfer of Care (Addendum)
Immediate Anesthesia Transfer of Care Note  Patient: [name] [name]  Procedure(s) Performed: THROMBECTOMY BILATERAL AOTRA-ILIO-FEMORAL ARTERY (Bilateral: Groin) BILATERAL FOUR COMPARTMENT FASCIOTOMY (Bilateral: Leg Lower) BILATERAL LOWER EXTREMITY ANGIOGRAM (Bilateral: Groin) AORTOGRAM (Groin) SUPERFICIAL FEMORAL ARTERY-POPITEAL EMBOLECTOMY  Patient Location: PACU  Anesthesia Type:General  Level of Consciousness: drowsy and patient cooperative  Airway & Oxygen Therapy: Patient Spontanous Breathing and Patient connected to nasal cannula oxygen  Post-op Assessment: Report given to RN, Post -op Vital signs reviewed and stable, and Patient moving all extremities X 4  Post vital signs: Reviewed and stable  Last Vitals:  Vitals Value Taken Time  BP 146/96 06/10/22 0732  Temp    Pulse 108 06/10/22 0734  Resp 17 06/10/22 0734  SpO2 87 % 06/10/22 0734  Vitals shown include unvalidated device data.  Last Pain:  Vitals:   06/10/22 0202  TempSrc:   PainSc: 0-No pain         Complications: No notable events documented.  Dr. Lissa Hoard at bedside

## 2022-06-10 NOTE — Anesthesia Procedure Notes (Signed)
Procedure Name: Intubation Date/Time: 06/10/2022 3:38 AM  Performed by: Trinna Post., CRNAPre-anesthesia Checklist: Patient identified, Emergency Drugs available, Suction available, Patient being monitored and Timeout performed Patient Re-evaluated:Patient Re-evaluated prior to induction Oxygen Delivery Method: Circle system utilized Preoxygenation: Pre-oxygenation with 100% oxygen Induction Type: IV induction Ventilation: Mask ventilation without difficulty Laryngoscope Size: Mac and 4 Grade View: Grade I Tube type: Oral Tube size: 7.5 mm Number of attempts: 1 Airway Equipment and Method: Stylet Placement Confirmation: ETT inserted through vocal cords under direct vision, positive ETCO2 and breath sounds checked- equal and bilateral Secured at: 22 cm Tube secured with: Tape Dental Injury: Teeth and Oropharynx as per pre-operative assessment

## 2022-06-10 NOTE — Consult Note (Signed)
Hospital Consult    Reason for Consult: Acute aortic occlusion Requesting Physician: ED MRN #:  [id number]  History of Present Illness: This is a 67 y.o. male who presented to the emergency department with right lower extremity pain and weakness which began roughly 10 PM this evening.  Denies sensorimotor deficits in the left lower extremity.  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  The left common femoral also has thrombus.  Exams morning, Marya Amsler was rather calm, accompanied by his daughter.  He noted new onset pain and numbness in the right lower extremity around 10 PM.  This progressed to motor weakness.  Marya Amsler is a smoker, describes of his a week or more when he comes to alcohol use. History of A-fib, no anticoagulation.  Past Medical History:  Diagnosis Date   A-fib ([place])    Acute ischemic stroke ([place])    Asthma    Gout    Hypertension    MRSA (methicillin resistant staph aureus) culture positive 2009    History reviewed. No pertinent surgical history.  Allergies  Allergen Reactions   Codeine Nausea And Vomiting    Prior to Admission medications   Medication Sig Start Date End Date Taking? Authorizing Provider  acetaminophen (TYLENOL) 500 MG tablet Take 500 mg by mouth every 6 (six) hours as needed for headache.    [provider]  albuterol (PROVENTIL HFA;VENTOLIN HFA) 108 (90 Base) MCG/ACT inhaler Inhale 2 puffs into the lungs every 6 (six) hours as needed for wheezing or shortness of breath. 04/07/18   Azzie Glatter, FNP  atorvastatin (LIPITOR) 40 MG tablet Take 1 tablet (40 mg total) by mouth daily. 04/07/18 07/06/18  Azzie Glatter, FNP  colchicine 0.6 MG tablet Take 1 tablet (0.6 mg total) by mouth daily. 04/07/18   Azzie Glatter, FNP  cyclobenzaprine (FLEXERIL) 10 MG tablet Take 1 tablet (10 mg total) by mouth 3 (three) times daily as needed for muscle spasms.  04/07/18   Azzie Glatter, FNP  lisinopril (PRINIVIL,ZESTRIL) 10 MG tablet Take 1 tablet (10 mg total) by mouth daily. 04/07/18   Azzie Glatter, FNP  metoprolol succinate (TOPROL-XL) 25 MG 24 hr tablet Take 1 tablet (25 mg total) by mouth daily. 04/07/18   Azzie Glatter, FNP  rivaroxaban (XARELTO) 20 MG TABS tablet Take 1 tablet (20 mg total) by mouth daily with supper. 04/07/18   Azzie Glatter, FNP    Social History   Socioeconomic History   Marital status: Married    Spouse name: Not on file   Number of children: Not on file   Years of education: Not on file   Highest education level: Not on file  Occupational History   Not on file  Tobacco Use   Smoking status: Former    Years: 87    Types: Cigarettes    Quit date: 07/21/2017    Years since quitting: 4.8   Smokeless tobacco: Never  Vaping Use   Vaping Use: Never used  Substance and Sexual Activity   Alcohol use: Yes    Comment: occ   Drug use: Not Currently   Sexual activity: Not on file  Other Topics Concern   Not on file  Social History Narrative   Not on file   Social Determinants of Health   Financial Resource Strain: Not on file  Food Insecurity: Not on file  Transportation Needs: Not on file  Physical  Activity: Not on file  Stress: Not on file  Social Connections: Not on file  Intimate Partner Violence: Not on file   Family History  Problem Relation Age of Onset   Heart disease Mother    Hypertension Father     ROS: Otherwise negative unless mentioned in HPI  Physical Examination  Vitals:   06/10/22 0130 06/10/22 0200  BP: (!) 179/101 (!) 178/114  Pulse: 95 94  Resp: 19 13  Temp:    SpO2: 99% 98%   Body mass index is 29.21 kg/m.  General:  WDWN in NAD Gait: Not observed HENT: WNL, normocephalic Pulmonary: normal non-labored breathing, without Rales, rhonchi,  wheezing Cardiac: irregular Abdomen:  soft, NT/ND, no masses Skin: without rashes Vascular Exam/Pulses: nonpalpable  femoral pulses, nonpalpable pedal pulses Extremities: without ischemic changes, without Gangrene , without cellulitis; without open wounds;  Musculoskeletal: no muscle wasting or atrophy  Neurologic: A&O X 3;  No focal weakness or paresthesias are detected; speech is fluent/normal Psychiatric:  The pt has Normal affect. Lymph:  Unremarkable  CBC    Component Value Date/Time   WBC 8.5 06/10/2022 0005   RBC 3.81 (L) 06/10/2022 0005   HGB 15.3 06/10/2022 0011   HCT 45.0 06/10/2022 0011   PLT 66 (L) 06/10/2022 0005   MCV 100.8 (H) 06/10/2022 0005   MCH 34.6 (H) 06/10/2022 0005   MCHC 34.4 06/10/2022 0005   RDW 15.8 (H) 06/10/2022 0005   LYMPHSABS 3.1 06/10/2022 0005   MONOABS 0.9 06/10/2022 0005   EOSABS 0.3 06/10/2022 0005   BASOSABS 0.1 06/10/2022 0005    BMET    Component Value Date/Time   NA 139 06/10/2022 0011   K 3.4 (L) 06/10/2022 0011   CL 104 06/10/2022 0011   CO2 20 (L) 06/10/2022 0005   GLUCOSE 143 (H) 06/10/2022 0011   BUN 17 06/10/2022 0011   CREATININE 0.90 06/10/2022 0011   CALCIUM 9.3 06/10/2022 0005   GFRNONAA >60 06/10/2022 0005   GFRAA >60 02/21/2018 0641    COAGS: Lab Results  Component Value Date   INR 1.3 (H) 06/10/2022    1. Diffuse vascular thrombosis with near complete occlusion of the right lower extremity extending from the origin of the common iliac artery through to the runoff vessels. A short segment of reconstituted flow is demonstrated in the common femoral artery. No significant collateral vessels are present. This is likely acute thrombosis. 2. Thrombosis of the left superficial femoral artery with short segment of reconstitution of the popliteal artery. No flow demonstrated in the runoff vessels. No significant collateral vessels are present. This is likely acute thrombosis. 3. Aortic atherosclerosis.   NON-VASCULAR   1. 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 2. Hepatic cirrhosis. 3.  Cholelithiasis without evidence of acute cholecystitis.    ASSESSMENT/PLAN: This is a 67 y.o. male status post cardioembolic event resulting in acute aortic occlusion with thrombus extending throughout the right iliac system, bilateral common femoral arteries.   Patient needs urgent revascularization for right lower extremity [place] 2B limb ischemia  I discussed the risks and benefits of bilateral lower extremity revascularization, embolectomy, possible axillofemoral, and possible femoral-femoral bypass, possible native stenting, Marya Amsler elected to proceed. Of note-CT also demonstrated hepatic cirrhosis, renal infarcts.  Patient will need CIWA protocol, and is aware that these diagnoses can affect morbidity mortality.   Cassandria Santee MD MS [facility] [phone] 06/10/2022  3:03 AM

## 2022-06-10 NOTE — Progress Notes (Signed)
Called regarding new, expanding hematoma in the right groin.  Nursing holding pressure at bedside when I arrived. Hematoma present. Heparan supratherapeutic. Small amount of bleeding from the incision site.  Appeared to be expanding.  Hold manual pressure, discussed with the patient urgent OR for right groin wound exploration for bleeding.  After discussing the risks and benefits, greater elected to proceed.  Emergent OR.  Appreciate care from OR, nursing and anesthesia to ensure this happened quickly.   Broadus John MD

## 2022-06-10 NOTE — OR Nursing (Signed)
MD Virl Cagey used 4 blue vessel loops cut in half with staples with faciotomy.

## 2022-06-10 NOTE — Progress Notes (Signed)
Right groin has expanding large hematoma, only bruising at last groin check. BP becoming labile. NS bolus given and Dr Virl Cagey notified. Plan for return to OR.

## 2022-06-10 NOTE — Anesthesia Postprocedure Evaluation (Signed)
Anesthesia Post Note  Patient: [name] [name]  Procedure(s) Performed: Virl Son EXPOSURE (Right) APPLICATION OF WOUND VAC WITH MYRIAD MORCELLS (Right: Groin) EVACUATION LEFT GROIN  HEMATOMA (Right: Groin)     Patient location during evaluation: PACU Anesthesia Type: General Level of consciousness: sedated and patient cooperative Pain management: pain level controlled Vital Signs Assessment: post-procedure vital signs reviewed and stable Respiratory status: spontaneous breathing Cardiovascular status: stable Anesthetic complications: no   No notable events documented.  Last Vitals:  Vitals:   06/10/22 1400 06/10/22 1610  BP:    Pulse:    Resp:    Temp: 37 C 37 C  SpO2:      Last Pain:  Vitals:   06/10/22 1610  TempSrc: Oral  PainSc:                  Nolon Nations

## 2022-06-10 NOTE — Op Note (Signed)
    NAME: JAYDA BANTHER    MRN: [id number] DOB: 06/22/1955    DATE OF OPERATION: 06/10/2022  PREOP DIAGNOSIS:    Right groin expanding hematoma  POSTOP DIAGNOSIS:    Same  PROCEDURE:    Right groin exploration Right groin hematoma evacuation Myriad more cell powder placement 15 French drain placement Prevena vacuum assisted dressing   SURGEON: Broadus John  ASSIST: Paulo Fruit, PA   ANESTHESIA: General   EBL: 64ml  INDICATIONS:    SUMPTER KICE is a 67 y.o. male status post aortic, bilateral iliofemoral, femoral-popliteal embolectomy for acute aortic occlusion earlier today.  Patient was left heparinized due to concern for cardioembolic etiology.  Patient was doing well earlier in the morning, however on further assessment by nursing around noon, an expanding hematoma was appreciated in the right groin.  Heparin drip was supratherapeutic.  I was concerned for arteriotomy bleeding, therefore we urgently discussed risk and benefits of right groin exploration, to which greater he elected to proceed.  FINDINGS:   Right groin hematoma. Dry arteriotomy.  TECHNIQUE:   Patient was brought to the OR laid in the supine position.  General anesthesia induced and the patient was prepped and draped urgently, while I was holding pressure, in an effort to prevent further expansion of the right groin hematoma.  Next, a scalpel was used to open the previous right groin incision.  Prior to making it to the femoral sheath, hematoma was encountered and evacuated.  The femoral sheath was reopened and the artery was found below. It was dry.  The arteriotomy was manipulated with no bleeding.  Next, I looked throughout the right groin for possible areas of bleeding.  Several 3-0 silk sutures were placed at sites where a small amount of venous bleeding was appreciated, however there was no arterial bleed that was appreciated.  The wound was irrigated with copious amounts of saline.  Being that  the groin was reopened, I elected to use myriad morcell powder to aid in healing.  In total 1 g was utilized. I elected to place a 15 French drain in the wound bed above the level of the femoral sheath. The wound was closed in layers with 2-0 Vicryl suture, with staples and Prevena VAC at the level of the skin.  Shunsuke was taken to PACU in stable condition.  The thigh was soft, significant ecchymosis appreciated.  Macie Burows, MD [facility] DATE OF DICTATION:   06/10/2022

## 2022-06-10 NOTE — Anesthesia Postprocedure Evaluation (Signed)
Anesthesia Post Note  Patient: [name] [name]  Procedure(s) Performed: THROMBECTOMY BILATERAL AOTRA-ILIO-FEMORAL ARTERY (Bilateral: Groin) BILATERAL FOUR COMPARTMENT FASCIOTOMY (Bilateral: Leg Lower) BILATERAL LOWER EXTREMITY ANGIOGRAM (Bilateral: Groin) AORTOGRAM (Groin) SUPERFICIAL FEMORAL ARTERY-POPITEAL EMBOLECTOMY     Patient location during evaluation: PACU Anesthesia Type: General Level of consciousness: sedated and patient cooperative Pain management: pain level controlled Vital Signs Assessment: post-procedure vital signs reviewed and stable Respiratory status: spontaneous breathing Cardiovascular status: stable Anesthetic complications: no   No notable events documented.  Last Vitals:  Vitals:   06/10/22 0745 06/10/22 0800  BP: (!) 141/93 (!) 146/90  Pulse: 94 85  Resp: (!) 24 10  Temp:  36.8 C  SpO2: 100% 97%    Last Pain:  Vitals:   06/10/22 0800  TempSrc:   PainSc: 0-No pain                 Nolon Nations

## 2022-06-10 NOTE — Anesthesia Procedure Notes (Signed)
Arterial Line Insertion Start/End3/24/2024 3:40 AM Performed by: Trinna Post., CRNA, CRNA  Patient location: OR. Preanesthetic checklist: patient identified, IV checked, site marked, risks and benefits discussed, surgical consent, monitors and equipment checked, pre-op evaluation, timeout performed and anesthesia consent Patient sedated Left, radial was placed Catheter size: 20 G Maximum sterile barriers used   Attempts: 1 Procedure performed without using ultrasound guided technique. Following insertion, dressing applied and Biopatch. Post procedure assessment: normal  Patient tolerated the procedure well with no immediate complications.

## 2022-06-11 ENCOUNTER — Inpatient Hospital Stay (HOSPITAL_COMMUNITY): Payer: Medicare Other

## 2022-06-11 ENCOUNTER — Encounter (HOSPITAL_COMMUNITY): Payer: Self-pay | Admitting: Vascular Surgery

## 2022-06-11 DIAGNOSIS — I7409 Other arterial embolism and thrombosis of abdominal aorta: Secondary | ICD-10-CM

## 2022-06-11 DIAGNOSIS — I4891 Unspecified atrial fibrillation: Secondary | ICD-10-CM

## 2022-06-11 LAB — PREPARE RBC (CROSSMATCH)

## 2022-06-11 LAB — COMPREHENSIVE METABOLIC PANEL
ALT: 75 U/L — ABNORMAL HIGH (ref 0–44)
ALT: 65 U/L — ABNORMAL HIGH (ref 0–44)
AST: 133 U/L — ABNORMAL HIGH (ref 15–41)
AST: 120 U/L — ABNORMAL HIGH (ref 15–41)
Albumin: 2.2 g/dL — ABNORMAL LOW (ref 3.5–5.0)
Albumin: 2.1 g/dL — ABNORMAL LOW (ref 3.5–5.0)
Alkaline Phosphatase: 54 U/L (ref 38–126)
Alkaline Phosphatase: 55 U/L (ref 38–126)
Anion gap: 6 (ref 5–15)
Anion gap: 8 (ref 5–15)
BUN: 20 mg/dL (ref 8–23)
BUN: 21 mg/dL (ref 8–23)
CO2: 21 mmol/L — ABNORMAL LOW (ref 22–32)
CO2: 21 mmol/L — ABNORMAL LOW (ref 22–32)
Calcium: 8 mg/dL — ABNORMAL LOW (ref 8.9–10.3)
Calcium: 8 mg/dL — ABNORMAL LOW (ref 8.9–10.3)
Chloride: 106 mmol/L (ref 98–111)
Chloride: 102 mmol/L (ref 98–111)
Creatinine, Ser: 1.08 mg/dL (ref 0.61–1.24)
Creatinine, Ser: 1.05 mg/dL (ref 0.61–1.24)
GFR, Estimated: >60 mL/min (ref >60)
GFR, Estimated: >60 mL/min (ref >60)
Glucose, Bld: 127 mg/dL — ABNORMAL HIGH (ref 70–99)
Glucose, Bld: 127 mg/dL — ABNORMAL HIGH (ref 70–99)
Potassium: 3.5 mmol/L (ref 3.5–5.1)
Potassium: 3.6 mmol/L (ref 3.5–5.1)
Sodium: 133 mmol/L — ABNORMAL LOW (ref 135–145)
Sodium: 131 mmol/L — ABNORMAL LOW (ref 135–145)
Total Bilirubin: 1 mg/dL (ref 0.3–1.2)
Total Bilirubin: 1.9 mg/dL — ABNORMAL HIGH (ref 0.3–1.2)
Total Protein: 4.6 g/dL — ABNORMAL LOW (ref 6.5–8.1)
Total Protein: 4.2 g/dL — ABNORMAL LOW (ref 6.5–8.1)

## 2022-06-11 LAB — BPAM FFP
Blood Product Expiration Date: 202403242359
Blood Product Expiration Date: 202403242359
ISSUE DATE / TIME: 202403241144
ISSUE DATE / TIME: 202403241516
Unit Type and Rh: 6200
Unit Type and Rh: 6200

## 2022-06-11 LAB — CBC
HCT: 20.8 % — ABNORMAL LOW (ref 39.0–52.0)
HCT: 23.9 % — ABNORMAL LOW (ref 39.0–52.0)
Hemoglobin: 7.2 g/dL — ABNORMAL LOW (ref 13.0–17.0)
Hemoglobin: 8.4 g/dL — ABNORMAL LOW (ref 13.0–17.0)
MCH: 34.3 pg — ABNORMAL HIGH (ref 26.0–34.0)
MCH: 31.7 pg (ref 26.0–34.0)
MCHC: 34.6 g/dL (ref 30.0–36.0)
MCHC: 35.1 g/dL (ref 30.0–36.0)
MCV: 99 fL (ref 80.0–100.0)
MCV: 90.2 fL (ref 80.0–100.0)
Platelets: 53 10*3/uL — ABNORMAL LOW (ref 150–400)
Platelets: 64 10*3/uL — ABNORMAL LOW (ref 150–400)
RBC: 2.1 MIL/uL — ABNORMAL LOW (ref 4.22–5.81)
RBC: 2.65 MIL/uL — ABNORMAL LOW (ref 4.22–5.81)
RDW: 17.6 % — ABNORMAL HIGH (ref 11.5–15.5)
RDW: 23 % — ABNORMAL HIGH (ref 11.5–15.5)
WBC: 16.7 10*3/uL — ABNORMAL HIGH (ref 4.0–10.5)
WBC: 16.3 10*3/uL — ABNORMAL HIGH (ref 4.0–10.5)
nRBC: 0 % (ref 0.0–0.2)
nRBC: 0.1 % (ref 0.0–0.2)

## 2022-06-11 LAB — PREPARE FRESH FROZEN PLASMA
Unit division: 0
Unit division: 0

## 2022-06-11 LAB — BASIC METABOLIC PANEL
Anion gap: 8 (ref 5–15)
BUN: 17 mg/dL (ref 8–23)
CO2: 22 mmol/L (ref 22–32)
Calcium: 8 mg/dL — ABNORMAL LOW (ref 8.9–10.3)
Chloride: 103 mmol/L (ref 98–111)
Creatinine, Ser: 1.12 mg/dL (ref 0.61–1.24)
GFR, Estimated: >60 mL/min (ref >60)
Glucose, Bld: 123 mg/dL — ABNORMAL HIGH (ref 70–99)
Potassium: 3.9 mmol/L (ref 3.5–5.1)
Sodium: 133 mmol/L — ABNORMAL LOW (ref 135–145)

## 2022-06-11 LAB — LIPID PANEL
Cholesterol: 68 mg/dL (ref 0–200)
HDL: 16 mg/dL — ABNORMAL LOW (ref >40)
LDL Cholesterol: 45 mg/dL (ref 0–99)
Total CHOL/HDL Ratio: 4.3 RATIO
Triglycerides: 36 mg/dL (ref <150)
VLDL: 7 mg/dL (ref 0–40)

## 2022-06-11 LAB — ECHOCARDIOGRAM COMPLETE
Height: 76 in
S' Lateral: 3.7 cm
Weight: 3840 oz

## 2022-06-11 LAB — BILIRUBIN, DIRECT: Bilirubin, Direct: 0.8 mg/dL — ABNORMAL HIGH (ref 0.0–0.2)

## 2022-06-11 LAB — HEPARIN LEVEL (UNFRACTIONATED): Heparin Unfractionated: 0.53 IU/mL (ref 0.30–0.70)

## 2022-06-11 MED ORDER — SODIUM CHLORIDE 0.9% IV SOLUTION: Freq: Once | INTRAVENOUS | Status: AC

## 2022-06-11 MED ORDER — SODIUM CHLORIDE 0.9 % IV BOLUS
1000.0000 mL | Freq: Once | INTRAVENOUS | Status: AC
  Administered 2022-06-11: 1000 mL via INTRAVENOUS

## 2022-06-11 MED ORDER — PERFLUTREN LIPID MICROSPHERE
1.0000 mL | INTRAVENOUS | Status: AC | PRN
  Administered 2022-06-11: 3 mL via INTRAVENOUS

## 2022-06-11 MED ORDER — LORAZEPAM 1 MG PO TABS
1.0000 mg | ORAL_TABLET | ORAL | Status: AC | PRN
  Filled 2022-06-11: qty 2

## 2022-06-11 MED ORDER — LORAZEPAM 2 MG/ML IJ SOLN: 1.0000 mg | INTRAMUSCULAR | Status: AC | PRN

## 2022-06-11 MED ORDER — HEPARIN SODIUM (PORCINE) 5000 UNIT/ML IJ SOLN
5000.0000 [IU] | Freq: Three times a day (TID) | INTRAMUSCULAR | Status: DC
  Administered 2022-06-11 – 2022-06-13 (×6): 5000 [IU] via SUBCUTANEOUS
  Filled 2022-06-11 (×6): qty 1

## 2022-06-11 NOTE — Progress Notes (Addendum)
Pt becoming increasingly tachycardic in 115-120s with blood pressure trending down. Pt alert and oriented and asymptomatic. Current hemoglobin 7.2, down from 8.4. Dr Virl Cagey notified, 2 PRBCs ordered and heparin gtt stopped. Echo also called to change status to STAT. Surgical sites continuing to ooze, primarily the left leg. Dressing reinforced at this time.   12:40 Blood transfusion initiated, echo at bedside

## 2022-06-11 NOTE — Progress Notes (Addendum)
Progress Note    06/11/2022 6:31 AM 1 Day Post-Op  Subjective:  says his right leg is feeling better.  He can move it more today.  Asked me to touch his toes and excited he can feel it.  Feels he needs to have a BM but wants to go to the bathroom.  Has not been out of bed.   Afebrile HR 80's-110's afib Q000111Q systolic 0000000 RA  Gtts:  none  Vitals:   06/11/22 0200 06/11/22 0400  BP:    Pulse: (!) 103 82  Resp: 12 (!) 25  Temp:  98 F (36.7 C)  SpO2: 98% 96%    Physical Exam: General:  no distress Cardiac:  irregular Lungs:  non labored Incisions:  right groin with Prevena with good seal; left groin clean and dry; bilateral fasciotomy incisions dressed with ace wrap.  There is some bloody drainage from them.  Extremities:  brisk bilateral PT doppler signals.  Able to wiggle toes and sensory improving right leg. Abdomen:  soft, NT  CBC    Component Value Date/Time   WBC 14.9 (H) 06/10/2022 2116   RBC 2.49 (L) 06/10/2022 2116   HGB 8.4 (L) 06/10/2022 2116   HCT 24.0 (L) 06/10/2022 2116   PLT 49 (L) 06/10/2022 2116   MCV 96.4 06/10/2022 2116   MCH 33.7 06/10/2022 2116   MCHC 35.0 06/10/2022 2116   RDW 17.2 (H) 06/10/2022 2116   LYMPHSABS 3.1 06/10/2022 0005   MONOABS 0.9 06/10/2022 0005   EOSABS 0.3 06/10/2022 0005   BASOSABS 0.1 06/10/2022 0005    BMET    Component Value Date/Time   NA 133 (L) 06/11/2022 0455   K 3.9 06/11/2022 0455   CL 103 06/11/2022 0455   CO2 22 06/11/2022 0455   GLUCOSE 123 (H) 06/11/2022 0455   BUN 17 06/11/2022 0455   CREATININE 1.12 06/11/2022 0455   CALCIUM 8.0 (L) 06/11/2022 0455   GFRNONAA >60 06/11/2022 0455   GFRAA >60 02/21/2018 0641    INR    Component Value Date/Time   INR 1.3 (H) 06/10/2022 0005     Intake/Output Summary (Last 24 hours) at 06/11/2022 0631 Last data filed at 06/11/2022 0200 Gross per 24 hour  Intake 4670.63 ml  Output 1265 ml  Net 3405.63 ml      Assessment/Plan:  67 y.o. male is s/p:   Aortic embolectomy Bilateral iliofemoral embolectomy Bilateral femoral-popliteal embolectomy Bilateral 4 compartment fasciotomy Aortogram Bilateral lower extremity angiogram And Right groin exploration Right groin hematoma evacuation Myriad more cell powder placement 15 French drain placement Prevena vacuum assisted dressing 1 Day Post-Op   -pt with brisk doppler flow bilateral PT -right groin with Prevena with good seal.  Left groin clean.  Both with ecchymosis.  Bilateral fasciotomy sites re-enforced with ace wraps.  I did not take these down as nurse reports pt will be returning to OR in a day or two for washout/closure.  -motor and sensory right leg improving -will need echo-has been ordered -probably ok to be out of bed-Dr.  Virl Cagey will be by to see pt this am.  -DVT prophylaxis:  heparin gtt   Leontine Locket, PA-C [facility] [phone] 06/11/2022 6:31 AM  VASCULAR STAFF ADDENDUM: I have independently interviewed and examined the patient. Hold anticoagulation until CBC obtained - low platelets from cirrhosis.  Echo ordered. Continue drain  CBC pending. Cirrhosis, may need platelets.    Cassandria Santee, MD [facility]  Number: [phone]) [id number] 06/11/2022 11:10 AM

## 2022-06-11 NOTE — Progress Notes (Signed)
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   Platelets ordered.  Blood ordered Liver function tests pending.   Hold anticoagulation due to platelet count and ongoing ooze from legs. Can be on TID SqHeparin Hold ASA.   NPO midnight, fasciotomy closure tomorrow  Broadus John MD

## 2022-06-11 NOTE — Progress Notes (Signed)
  Echocardiogram 2D Echocardiogram has been performed.  Oneal Deputy Sharis Keeran RDCS 06/11/2022, 1:16 PM

## 2022-06-11 NOTE — Progress Notes (Signed)
PT Cancellation Note  Patient Details Name: [name] [name] MRN: [id number] DOB: Feb 27, 1956   Cancelled Treatment:    Reason Eval/Treat Not Completed: Patient not medically ready. Pt currently with bilat LE open fasciotomies and is bilat LE NWB with plan to return to OR tomorrow 3/26 to close them. Acute PT to return as able, as appropriate to complete PT eval.  Kittie Plater, PT, DPT Acute Rehabilitation Services Secure chat preferred Office #: [phone]    Berline Lopes 06/11/2022, 7:51 AM

## 2022-06-11 NOTE — Evaluation (Signed)
Occupational Therapy Evaluation Patient Details Name: [name] [name] MRN: [id number] DOB: [birth year] Today's Date: 06/11/2022   History of Present Illness Pt is a 67 yo male admitted to ER with numbness and B leg pain. Pt found to have clots in BLEs and undersent an aortic emoliectomy, B ileofemoral embolectomy, B fem/pop embolectomies, B 4 compartment fasciotomies and a R groin expanding hematoma evacuation surgery.  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   Clinical Impression   Pt admitted with the above diagnosis and has the deficits outlined below. Pt would benefit from cont OT to increase independence with basic adls at bed level to start and eventually transition to adls on his feet once cleared to bear weight through his legs after scheduled surgery for 3/26.  Pt currently is NWB BLEs with BLE open fasciotomies therefore limited with all mobility at this time.  Will reeval when able to weight bear but continue with OT at bed eval until then.       Recommendations for follow up therapy are one component of a multi-disciplinary discharge planning process, led by the attending physician.  Recommendations may be updated based on patient status, additional functional criteria and insurance authorization.   Assistance Recommended at Discharge Frequent or constant Supervision/Assistance  Patient can return home with the following Other (comment) (unable to return home at this time)    Functional Status Assessment  Patient has had a recent decline in their functional status and demonstrates the ability to make significant improvements in function in a reasonable and predictable amount of time.  Equipment Recommendations  BSC/3in1;Tub/shower bench    Recommendations for Other Services       Precautions / Restrictions Precautions Precautions: Fall Precaution Comments: open fasciotomies at this time. Surgery scheduled for  3/26. Restrictions Weight Bearing Restrictions: Yes RLE Weight Bearing: Non weight bearing LLE Weight Bearing: Non weight bearing Other Position/Activity Restrictions: cannot dangle legs of edge of bed due to open fasciotomies.      Mobility Bed Mobility               General bed mobility comments: declined due to open wounds on legs    Transfers                   General transfer comment: declined until after surgery scheduled for 3/26      Balance                                           ADL either performed or assessed with clinical judgement   ADL Overall ADL's : Needs assistance/impaired Eating/Feeding: Set up;Sitting   Grooming: Wash/dry hands;Wash/dry face;Oral care;Set up;Sitting;Bed level   Upper Body Bathing: Set up;Sitting   Lower Body Bathing: Total assistance;Sitting/lateral leans;Bed level Lower Body Bathing Details (indicate cue type and reason): deferred any rolling or mobility until leg surgery complete. Scheduled for 3/26 Upper Body Dressing : Minimal assistance;Bed level   Lower Body Dressing: Total assistance;+2 for physical assistance;Bed level     Toilet Transfer Details (indicate cue type and reason): declined. Only option would be a anterior/posterior transfer keeping legs on bed and elavated.  Pt with a significant amount of bleeding so all transfers deferred for now Dodge and Hygiene: Total assistance;Bed level       Functional mobility during ADLs: Total assistance General  ADL Comments: Pt limited with all LE adls due to B open fasciotomies of BLEs     Vision Baseline Vision/History: 1 Wears glasses Ability to See in Adequate Light: 0 Adequate Patient Visual Report: No change from baseline Vision Assessment?: No apparent visual deficits Additional Comments: glasses are at home. Pt called wife to bring them in today     Perception Perception Perception Tested?: No   Praxis  Praxis Praxis tested?: Not tested    Pertinent Vitals/Pain Pain Assessment Pain Assessment: Faces Faces Pain Scale: Hurts whole lot Pain Location: R groin and BLEs Pain Descriptors / Indicators: Cramping, Discomfort, Grimacing, Guarding, Sharp, Shooting, Sore Pain Intervention(s): Limited activity within patient's tolerance, Monitored during session, Repositioned, Patient requesting pain meds-RN notified     Hand Dominance Right   Extremity/Trunk Assessment Upper Extremity Assessment Upper Extremity Assessment: Overall WFL for tasks assessed   Lower Extremity Assessment Lower Extremity Assessment: Defer to PT evaluation   Cervical / Trunk Assessment Cervical / Trunk Assessment: Normal   Communication Communication Communication: No difficulties   Cognition Arousal/Alertness: Awake/alert Behavior During Therapy: WFL for tasks assessed/performed Overall Cognitive Status: Within Functional Limits for tasks assessed                                       General Comments  Pt with multiple IV lines, open leg wounds and groin wound.    Exercises Exercises: Other exercises Other Exercises Other Exercises: Pt provided with theraband (two resistances) and exercises demonstrated to attempt keep BUEs strong as pt on bedrest at this time.  Instructed pt to watch IV lines.   Shoulder Instructions      Home Living Family/patient expects to be discharged to:: Private residence Living Arrangements: Spouse/significant other;Children Available Help at Discharge: Family;Available 24 hours/day Type of Home: House Home Access: Level entry     Home Layout: One level     Bathroom Shower/Tub: Walk-in shower;Tub/shower Editor, commissioning: Standard     Home Equipment: Crutches;Wheelchair - manual   Additional Comments: Pt lives with roommate and is separated from wife but most likely will return home with wife bc her home is more accessible and his daughter is  there to assist as needed.      Prior Functioning/Environment Prior Level of Function : Independent/Modified Independent                        OT Problem List: Decreased strength;Decreased range of motion;Impaired balance (sitting and/or standing);Decreased knowledge of use of DME or AE;Decreased knowledge of precautions;Impaired sensation;Pain      OT Treatment/Interventions: Self-care/ADL training;Therapeutic activities;DME and/or AE instruction;Balance training    OT Goals(Current goals can be found in the care plan section) Acute Rehab OT Goals Patient Stated Goal: to get well and up on my feet again OT Goal Formulation: With patient Time For Goal Achievement: 06/25/22 Potential to Achieve Goals: Good ADL Goals Pt Will Perform Lower Body Bathing: with supervision;bed level Pt Will Perform Lower Body Dressing: with min assist;bed level Pt/caregiver will Perform Home Exercise Program: Increased strength;Both right and left upper extremity;With theraband;Independently Additional ADL Goal #1: Pt will progress with transfers and mobility for adls as ordered after surgery scheduld for 06/12/22  OT Frequency: Min 2X/week    Co-evaluation              AM-PAC OT "6 Clicks" Daily Activity  Outcome Measure Help from another person eating meals?: None Help from another person taking care of personal grooming?: None Help from another person toileting, which includes using toliet, bedpan, or urinal?: Total Help from another person bathing (including washing, rinsing, drying)?: A Lot Help from another person to put on and taking off regular upper body clothing?: A Little Help from another person to put on and taking off regular lower body clothing?: Total 6 Click Score: 15   End of Session Nurse Communication: Mobility status  Activity Tolerance: Patient tolerated treatment well Patient left: in bed;with call bell/phone within reach  OT Visit Diagnosis: Other  abnormalities of gait and mobility (R26.89)                Time: [id number] OT Time Calculation (min): 27 min Charges:  OT General Charges $OT Visit: 1 Visit OT Evaluation $OT Eval Moderate Complexity: 1 Mod OT Treatments $Therapeutic Exercise: 8-22 mins  Glenford Peers 06/11/2022, 9:19 AM

## 2022-06-11 NOTE — Progress Notes (Signed)
ANTICOAGULATION CONSULT NOTE  Pharmacy Consult for Heparin Indication: acute iliac thrombosis Brief A/P: Heparin level within goal range Continue Heparin at current rate   Allergies  Allergen Reactions   Codeine Nausea And Vomiting    Patient Measurements: Height: 6\' 4"  (193 cm) Weight: 108.9 kg (240 lb) IBW/kg (Calculated) : 86.8 Heparin Dosing Weight: 105 kg  Vital Signs: Temp: 98.6 F (37 C) (03/24 2315) Temp Source: Oral (03/24 2315) Pulse Rate: 103 (03/25 0200)  Labs: Recent Labs    06/10/22 0005 06/10/22 0011 06/10/22 0651 06/10/22 0958 06/10/22 1211 06/10/22 1421 06/10/22 2116 06/10/22 2300 06/11/22 0134  HGB 13.2 15.3   < > 10.2* 8.8*  --  8.4*  --   --   HCT 38.4* 45.0   < > 30.0* 26.0*  --  24.0*  --   --   PLT 66*  --   --  60*  --   --  49*  --   --   LABPROT 16.5*  --   --   --   --   --   --   --   --   INR 1.3*  --   --   --   --   --   --   --   --   HEPARINUNFRC  --   --    < > 1.05*  --  0.35  --   --  0.53  CREATININE 0.99 0.90  --  1.01  --   --   --  1.20  --    < > = values in this interval not displayed.     Estimated Creatinine Clearance: 80.8 mL/min (by C-G formula based on SCr of 1.2 mg/dL).  Assessment: 67 y.o. male admitted with aortic occlusion and iliac thrombus s/p thrombectomy for heparin Goal of Therapy:  Heparin level 0.3-0.7 units/ml Monitor platelets by anticoagulation protocol: Yes   Plan:  Continue Heparin at current rate    Tarri Guilfoil, Bronson Curb 06/11/2022,2:12 AM

## 2022-06-12 ENCOUNTER — Other Ambulatory Visit (HOSPITAL_COMMUNITY): Payer: Self-pay

## 2022-06-12 ENCOUNTER — Inpatient Hospital Stay (HOSPITAL_COMMUNITY): Payer: Medicare Other | Admitting: Certified Registered Nurse Anesthetist

## 2022-06-12 ENCOUNTER — Encounter (HOSPITAL_COMMUNITY)
Admission: EM | Disposition: A | Discharge status: Home Health | Payer: Self-pay | Source: Home / Self Care | Attending: Vascular Surgery

## 2022-06-12 ENCOUNTER — Encounter (HOSPITAL_COMMUNITY): Payer: Self-pay | Admitting: Vascular Surgery

## 2022-06-12 ENCOUNTER — Other Ambulatory Visit: Payer: Self-pay

## 2022-06-12 DIAGNOSIS — I4891 Unspecified atrial fibrillation: Secondary | ICD-10-CM | POA: Diagnosis not present

## 2022-06-12 DIAGNOSIS — T8189XA Other complications of procedures, not elsewhere classified, initial encounter: Secondary | ICD-10-CM

## 2022-06-12 DIAGNOSIS — I10 Essential (primary) hypertension: Secondary | ICD-10-CM

## 2022-06-12 DIAGNOSIS — M79A22 Nontraumatic compartment syndrome of left lower extremity: Secondary | ICD-10-CM

## 2022-06-12 DIAGNOSIS — F17210 Nicotine dependence, cigarettes, uncomplicated: Secondary | ICD-10-CM | POA: Diagnosis not present

## 2022-06-12 DIAGNOSIS — M79A21 Nontraumatic compartment syndrome of right lower extremity: Secondary | ICD-10-CM

## 2022-06-12 HISTORY — PX: FASCIOTOMY CLOSURE: SHX5829

## 2022-06-12 LAB — CBC
HCT: 23.8 % — ABNORMAL LOW (ref 39.0–52.0)
HCT: 31 % — ABNORMAL LOW (ref 39.0–52.0)
Hemoglobin: 8.3 g/dL — ABNORMAL LOW (ref 13.0–17.0)
Hemoglobin: 10.7 g/dL — ABNORMAL LOW (ref 13.0–17.0)
MCH: 31.9 pg (ref 26.0–34.0)
MCH: 31.2 pg (ref 26.0–34.0)
MCHC: 34.9 g/dL (ref 30.0–36.0)
MCHC: 34.5 g/dL (ref 30.0–36.0)
MCV: 91.5 fL (ref 80.0–100.0)
MCV: 90.4 fL (ref 80.0–100.0)
Platelets: 64 10*3/uL — ABNORMAL LOW (ref 150–400)
Platelets: 101 10*3/uL — ABNORMAL LOW (ref 150–400)
RBC: 2.6 MIL/uL — ABNORMAL LOW (ref 4.22–5.81)
RBC: 3.43 MIL/uL — ABNORMAL LOW (ref 4.22–5.81)
RDW: 23.3 % — ABNORMAL HIGH (ref 11.5–15.5)
RDW: 21.2 % — ABNORMAL HIGH (ref 11.5–15.5)
WBC: 15.3 10*3/uL — ABNORMAL HIGH (ref 4.0–10.5)
WBC: 13.7 10*3/uL — ABNORMAL HIGH (ref 4.0–10.5)
nRBC: 0.1 % (ref 0.0–0.2)
nRBC: 0.2 % (ref 0.0–0.2)

## 2022-06-12 LAB — SURGICAL PCR SCREEN
MRSA, PCR: NEGATIVE
Staphylococcus aureus: NEGATIVE

## 2022-06-12 LAB — BASIC METABOLIC PANEL
Anion gap: 6 (ref 5–15)
BUN: 22 mg/dL (ref 8–23)
CO2: 21 mmol/L — ABNORMAL LOW (ref 22–32)
Calcium: 7.7 mg/dL — ABNORMAL LOW (ref 8.9–10.3)
Chloride: 104 mmol/L (ref 98–111)
Creatinine, Ser: 0.98 mg/dL (ref 0.61–1.24)
GFR, Estimated: >60 mL/min (ref >60)
Glucose, Bld: 114 mg/dL — ABNORMAL HIGH (ref 70–99)
Potassium: 3.7 mmol/L (ref 3.5–5.1)
Sodium: 131 mmol/L — ABNORMAL LOW (ref 135–145)

## 2022-06-12 LAB — HEPARIN LEVEL (UNFRACTIONATED): Heparin Unfractionated: <0.1 IU/mL — ABNORMAL LOW (ref 0.30–0.70)

## 2022-06-12 LAB — BPAM PLATELET PHERESIS
Blood Product Expiration Date: 202403272359
Blood Product Expiration Date: 202403282359
ISSUE DATE / TIME: 202403251455
Unit Type and Rh: 600
Unit Type and Rh: 6200

## 2022-06-12 LAB — PREPARE PLATELET PHERESIS
Unit division: 0
Unit division: 0

## 2022-06-12 LAB — COMPREHENSIVE METABOLIC PANEL
ALT: 65 U/L — ABNORMAL HIGH (ref 0–44)
AST: 108 U/L — ABNORMAL HIGH (ref 15–41)
Albumin: 2.6 g/dL — ABNORMAL LOW (ref 3.5–5.0)
Alkaline Phosphatase: 67 U/L (ref 38–126)
Anion gap: 7 (ref 5–15)
BUN: 21 mg/dL (ref 8–23)
CO2: 22 mmol/L (ref 22–32)
Calcium: 8.2 mg/dL — ABNORMAL LOW (ref 8.9–10.3)
Chloride: 103 mmol/L (ref 98–111)
Creatinine, Ser: 1.19 mg/dL (ref 0.61–1.24)
GFR, Estimated: >60 mL/min (ref >60)
Glucose, Bld: 189 mg/dL — ABNORMAL HIGH (ref 70–99)
Potassium: 4 mmol/L (ref 3.5–5.1)
Sodium: 132 mmol/L — ABNORMAL LOW (ref 135–145)
Total Bilirubin: 2.3 mg/dL — ABNORMAL HIGH (ref 0.3–1.2)
Total Protein: 5.7 g/dL — ABNORMAL LOW (ref 6.5–8.1)

## 2022-06-12 LAB — PREPARE RBC (CROSSMATCH)

## 2022-06-12 LAB — SURGICAL PATHOLOGY

## 2022-06-12 SURGERY — FASCIOTOMY CLOSURE
Anesthesia: General | Site: Leg Lower | Laterality: Bilateral

## 2022-06-12 MED ORDER — ROCURONIUM BROMIDE 10 MG/ML (PF) SYRINGE
PREFILLED_SYRINGE | INTRAVENOUS | Status: AC
  Filled 2022-06-12: qty 10

## 2022-06-12 MED ORDER — NEOSTIGMINE METHYLSULFATE 3 MG/3ML IV SOSY
PREFILLED_SYRINGE | INTRAVENOUS | Status: AC
  Filled 2022-06-12: qty 3

## 2022-06-12 MED ORDER — ONDANSETRON HCL 4 MG/2ML IJ SOLN
INTRAMUSCULAR | Status: AC
  Filled 2022-06-12: qty 2

## 2022-06-12 MED ORDER — LIDOCAINE 2% (20 MG/ML) 5 ML SYRINGE
INTRAMUSCULAR | Status: DC | PRN
  Administered 2022-06-12: 60 mg via INTRAVENOUS

## 2022-06-12 MED ORDER — ACETAMINOPHEN 10 MG/ML IV SOLN
INTRAVENOUS | Status: AC
  Filled 2022-06-12: qty 100

## 2022-06-12 MED ORDER — GLYCOPYRROLATE PF 0.2 MG/ML IJ SOSY
PREFILLED_SYRINGE | INTRAMUSCULAR | Status: DC | PRN
  Administered 2022-06-12: .2 mg via INTRAVENOUS

## 2022-06-12 MED ORDER — 0.9 % SODIUM CHLORIDE (POUR BTL) OPTIME
TOPICAL | Status: DC | PRN
  Administered 2022-06-12 (×2): 1000 mL

## 2022-06-12 MED ORDER — CHLORHEXIDINE GLUCONATE 0.12 % MT SOLN
OROMUCOSAL | Status: AC
  Filled 2022-06-12: qty 15

## 2022-06-12 MED ORDER — HYDROMORPHONE HCL 1 MG/ML IJ SOLN
0.2500 mg | INTRAMUSCULAR | Status: DC | PRN
  Administered 2022-06-12: 0.5 mg via INTRAVENOUS

## 2022-06-12 MED ORDER — ONDANSETRON HCL 4 MG/2ML IJ SOLN
INTRAMUSCULAR | Status: DC | PRN
  Administered 2022-06-12: 4 mg via INTRAVENOUS

## 2022-06-12 MED ORDER — ESMOLOL HCL 100 MG/10ML IV SOLN
INTRAVENOUS | Status: AC
  Filled 2022-06-12: qty 10

## 2022-06-12 MED ORDER — LACTATED RINGERS IV SOLN: INTRAVENOUS | Status: DC

## 2022-06-12 MED ORDER — ACETAMINOPHEN 10 MG/ML IV SOLN
INTRAVENOUS | Status: DC | PRN
  Administered 2022-06-12: 1000 mg via INTRAVENOUS

## 2022-06-12 MED ORDER — MIDAZOLAM HCL 2 MG/2ML IJ SOLN
INTRAMUSCULAR | Status: AC
  Filled 2022-06-12: qty 2

## 2022-06-12 MED ORDER — CHLORHEXIDINE GLUCONATE 0.12 % MT SOLN
OROMUCOSAL | Status: AC
  Administered 2022-06-12: 15 mL via OROMUCOSAL
  Filled 2022-06-12: qty 15

## 2022-06-12 MED ORDER — ALBUTEROL SULFATE HFA 108 (90 BASE) MCG/ACT IN AERS
INHALATION_SPRAY | RESPIRATORY_TRACT | Status: AC
  Filled 2022-06-12: qty 6.7

## 2022-06-12 MED ORDER — ORAL CARE MOUTH RINSE: 15.0000 mL | Freq: Once | OROMUCOSAL | Status: AC

## 2022-06-12 MED ORDER — GLYCOPYRROLATE PF 0.2 MG/ML IJ SOSY
PREFILLED_SYRINGE | INTRAMUSCULAR | Status: AC
  Filled 2022-06-12: qty 3

## 2022-06-12 MED ORDER — ROCURONIUM BROMIDE 10 MG/ML (PF) SYRINGE
PREFILLED_SYRINGE | INTRAVENOUS | Status: DC | PRN
  Administered 2022-06-12: 40 mg via INTRAVENOUS

## 2022-06-12 MED ORDER — CHLORHEXIDINE GLUCONATE 0.12 % MT SOLN: 15.0000 mL | Freq: Once | OROMUCOSAL | Status: AC

## 2022-06-12 MED ORDER — PHENYLEPHRINE HCL-NACL 20-0.9 MG/250ML-% IV SOLN
INTRAVENOUS | Status: DC | PRN
  Administered 2022-06-12: 40 ug/min via INTRAVENOUS

## 2022-06-12 MED ORDER — PHENYLEPHRINE HCL (PRESSORS) 10 MG/ML IV SOLN
INTRAVENOUS | Status: DC | PRN
  Administered 2022-06-12: 80 ug via INTRAVENOUS
  Administered 2022-06-12: 160 ug via INTRAVENOUS

## 2022-06-12 MED ORDER — ALBUTEROL SULFATE HFA 108 (90 BASE) MCG/ACT IN AERS
INHALATION_SPRAY | RESPIRATORY_TRACT | Status: DC | PRN
  Administered 2022-06-12: 8 via RESPIRATORY_TRACT

## 2022-06-12 MED ORDER — LIDOCAINE 2% (20 MG/ML) 5 ML SYRINGE
INTRAMUSCULAR | Status: AC
  Filled 2022-06-12: qty 5

## 2022-06-12 MED ORDER — MIDAZOLAM HCL 5 MG/5ML IJ SOLN
INTRAMUSCULAR | Status: DC | PRN
  Administered 2022-06-12: 2 mg via INTRAVENOUS

## 2022-06-12 MED ORDER — DEXAMETHASONE SODIUM PHOSPHATE 10 MG/ML IJ SOLN
INTRAMUSCULAR | Status: AC
  Filled 2022-06-12: qty 1

## 2022-06-12 MED ORDER — NEOSTIGMINE METHYLSULFATE 3 MG/3ML IV SOSY
PREFILLED_SYRINGE | INTRAVENOUS | Status: DC | PRN
  Administered 2022-06-12: 4 mg via INTRAVENOUS

## 2022-06-12 MED ORDER — DEXAMETHASONE SODIUM PHOSPHATE 10 MG/ML IJ SOLN
INTRAMUSCULAR | Status: DC | PRN
  Administered 2022-06-12: 10 mg via INTRAVENOUS

## 2022-06-12 MED ORDER — FENTANYL CITRATE (PF) 250 MCG/5ML IJ SOLN
INTRAMUSCULAR | Status: AC
  Filled 2022-06-12: qty 5

## 2022-06-12 MED ORDER — ESMOLOL HCL 100 MG/10ML IV SOLN
INTRAVENOUS | Status: DC | PRN
  Administered 2022-06-12: 10 mg via INTRAVENOUS

## 2022-06-12 MED ORDER — PROPOFOL 10 MG/ML IV BOLUS
INTRAVENOUS | Status: AC
  Filled 2022-06-12: qty 20

## 2022-06-12 MED ORDER — GLYCOPYRROLATE PF 0.2 MG/ML IJ SOSY
PREFILLED_SYRINGE | INTRAMUSCULAR | Status: AC
  Filled 2022-06-12: qty 1

## 2022-06-12 MED ORDER — FENTANYL CITRATE (PF) 250 MCG/5ML IJ SOLN
INTRAMUSCULAR | Status: DC | PRN
  Administered 2022-06-12 (×4): 50 ug via INTRAVENOUS

## 2022-06-12 MED ORDER — PROPOFOL 10 MG/ML IV BOLUS
INTRAVENOUS | Status: DC | PRN
  Administered 2022-06-12: 100 mg via INTRAVENOUS
  Administered 2022-06-12: 40 mg via INTRAVENOUS

## 2022-06-12 MED ORDER — HYDROMORPHONE HCL 1 MG/ML IJ SOLN
INTRAMUSCULAR | Status: AC
  Filled 2022-06-12: qty 1

## 2022-06-12 MED ORDER — DILTIAZEM HCL 100 MG IV SOLR
5.0000 mg | Freq: Once | INTRAVENOUS | Status: DC
  Filled 2022-06-12 (×2): qty 5

## 2022-06-12 MED ORDER — STERILE WATER FOR IRRIGATION IR SOLN
Status: DC | PRN
  Administered 2022-06-12: 1000 mL

## 2022-06-12 SURGICAL SUPPLY — 23 items
BAG COUNTER SPONGE SURGICOUNT (BAG) ×1 IMPLANT
BNDG ELASTIC 6INX 5YD STR LF (GAUZE/BANDAGES/DRESSINGS) IMPLANT
CANISTER SUCT 3000ML PPV (MISCELLANEOUS) ×1 IMPLANT
COVER SURGICAL LIGHT HANDLE (MISCELLANEOUS) IMPLANT
DRAPE BILATERAL LIMB T (DRAPES) IMPLANT
DRSG OPSITE POSTOP 4X12 (GAUZE/BANDAGES/DRESSINGS) IMPLANT
ELECT REM PT RETURN 9FT ADLT (ELECTROSURGICAL) ×1
ELECTRODE REM PT RTRN 9FT ADLT (ELECTROSURGICAL) ×1 IMPLANT
GAUZE SPONGE 4X4 12PLY STRL (GAUZE/BANDAGES/DRESSINGS) ×1 IMPLANT
GLOVE BIOGEL PI IND STRL 8 (GLOVE) ×1 IMPLANT
GOWN STRL REUS W/ TWL LRG LVL3 (GOWN DISPOSABLE) ×3 IMPLANT
GOWN STRL REUS W/TWL 2XL LVL3 (GOWN DISPOSABLE) ×2 IMPLANT
GOWN STRL REUS W/TWL LRG LVL3 (GOWN DISPOSABLE) ×3
KIT BASIN OR (CUSTOM PROCEDURE TRAY) ×1 IMPLANT
KIT TURNOVER KIT B (KITS) ×1 IMPLANT
NS IRRIG 1000ML POUR BTL (IV SOLUTION) ×1 IMPLANT
PACK GENERAL/GYN (CUSTOM PROCEDURE TRAY) ×1 IMPLANT
PACK UNIVERSAL I (CUSTOM PROCEDURE TRAY) ×1 IMPLANT
PAD ARMBOARD 7.5X6 YLW CONV (MISCELLANEOUS) ×2 IMPLANT
STAPLER VISISTAT 35W (STAPLE) IMPLANT
SUT ETHILON 2 0 PSLX (SUTURE) IMPLANT
TOWEL GREEN STERILE (TOWEL DISPOSABLE) ×1 IMPLANT
WATER STERILE IRR 1000ML POUR (IV SOLUTION) ×1 IMPLANT

## 2022-06-12 NOTE — Anesthesia Procedure Notes (Signed)
Procedure Name: Intubation Date/Time: 06/12/2022 10:08 AM  Performed by: Maude Leriche, CRNAPre-anesthesia Checklist: Patient identified, Emergency Drugs available, Suction available and Patient being monitored Patient Re-evaluated:Patient Re-evaluated prior to induction Oxygen Delivery Method: Circle system utilized Preoxygenation: Pre-oxygenation with 100% oxygen Induction Type: IV induction Ventilation: Mask ventilation without difficulty Laryngoscope Size: Miller and 3 Grade View: Grade I Tube type: Oral Tube size: 7.5 mm Number of attempts: 1 Airway Equipment and Method: Stylet and Bite block Placement Confirmation: ETT inserted through vocal cords under direct vision, positive ETCO2 and breath sounds checked- equal and bilateral Secured at: 22 cm Tube secured with: Tape Dental Injury: Teeth and Oropharynx as per pre-operative assessment

## 2022-06-12 NOTE — Anesthesia Postprocedure Evaluation (Signed)
Anesthesia Post Note  Patient: [name] [name]  Procedure(s) Performed: BILATERAL LOWER EXTREMITY FASCIOTOMY CLOSURE (Bilateral: Leg Lower)     Patient location during evaluation: PACU Anesthesia Type: General Level of consciousness: awake and alert Pain management: pain level controlled Vital Signs Assessment: post-procedure vital signs reviewed and stable Respiratory status: spontaneous breathing, nonlabored ventilation, respiratory function stable and patient connected to nasal cannula oxygen Cardiovascular status: blood pressure returned to baseline, stable and tachycardic Postop Assessment: no apparent nausea or vomiting Anesthetic complications: no   No notable events documented.  Last Vitals:  Vitals:   06/12/22 1239 06/12/22 1245  BP: (!) 122/92 116/75  Pulse: 97 91  Resp: 19 10  Temp: 36.9 C   SpO2: 94% 96%    Last Pain:  Vitals:   06/12/22 1239  TempSrc: Oral  PainSc:                  Audry Pili

## 2022-06-12 NOTE — Op Note (Signed)
    NAME: [name] [name]    MRN: [id number] DOB: [birth year]    DATE OF OPERATION: 06/12/2022  PREOP DIAGNOSIS:    Bilateral lower extremity open fasciotomies  POSTOP DIAGNOSIS:    Same  PROCEDURE:    Bilateral lower extremity low leg fasciotomy closure  SURGEON: Broadus John  ASSIST: CNRA  ANESTHESIA: General   EBL: 81ml  INDICATIONS:    [name] [name] is a 67 y.o. male status post aortic, bilateral iliofemoral, femoral popliteal embolectomy for acute aortic occlusion.  He also required 4 compartment fasciotomy.  He presents to the OR today for fasciotomy closure.  FINDINGS:   Healthy appearing calf muscle in all compartments bilaterally Fasciotomy closure on the left lateral 20 x 5 x 1, medial 22 x 5 x 1 cm Fasciotomy closure on the right lateral 20 x 6 x 1 cm, medial 20 x 5 x 1 cm  TECHNIQUE:   Patient was brought to the OR laid in supine position.  General anesthesia was induced and patient was prepped and draped in standard fashion.  The case began with removal of the previously placed Vesseloops in Roman sandal fashion.  Staples were removed.  The wound beds were irrigated with copious amounts of warm saline.  Next, 2-0 nylon suture was used in vertical mattress fashion to close the fasciotomies with roughly 4 cm between each suture.  Following this, staples were used to close the fasciotomies at the level of the skin.  Sterile dressings were placed, and Ace wrap's were placed for light compression.  The patient had excellent, triphasic signals in the posterior tibial arteries at case completion.  Macie Burows, MD [facility] DATE OF DICTATION:   06/12/2022

## 2022-06-12 NOTE — Anesthesia Preprocedure Evaluation (Addendum)
Anesthesia Evaluation  Patient identified by MRN, date of birth, ID band Patient awake    Reviewed: Allergy & Precautions, NPO status , Patient's Chart, lab work & pertinent test results, reviewed documented beta blocker date and time   History of Anesthesia Complications Negative for: history of anesthetic complications  Airway Mallampati: II  TM Distance: >3 FB Neck ROM: Full    Dental  (+) Dental Advisory Given   Pulmonary asthma , Patient abstained from smoking., former smoker   Pulmonary exam normal        Cardiovascular hypertension, Pt. on medications and Pt. 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 Technically difficult study with poor visualization of cardiac  structures. Patient in atrial fibrillation.   2. Left ventricular systolic function is mildly reduced secondary to  atrial fibrillation with an estimated EF of 40%. There is significant beat  to beat variability. Consider repeat limited study once patient in sinus  rhythm.   3. Right ventricular systolic function is normal. The right ventricular  size is normal. There is normal pulmonary artery systolic pressure.   4. The mitral valve is normal in structure. No evidence of mitral valve  regurgitation. No evidence of mitral stenosis.   5. The aortic valve is normal in structure. Aortic valve regurgitation is  not visualized. No aortic stenosis is present.     Neuro/Psych CVA  negative psych ROS   GI/Hepatic negative GI ROS,,,(+) Cirrhosis         Endo/Other  negative endocrine ROS    Renal/GU negative Renal ROS  negative genitourinary   Musculoskeletal negative musculoskeletal ROS (+)     Abdominal   Peds  Hematology  (+) Blood dyscrasia (Hgb 8.3, Plt 64k), anemia   Anesthesia Other Findings Day of surgery medications reviewed with patient.  Reproductive/Obstetrics negative OB ROS                             Anesthesia Physical Anesthesia Plan  ASA: 4  Anesthesia Plan: General   Post-op Pain Management: Ofirmev IV (intra-op)*   Induction: Intravenous  PONV Risk Score and Plan: 2 and Treatment may vary due to age or medical condition, Ondansetron, Dexamethasone and Midazolam  Airway Management Planned: Oral ETT  Additional Equipment: None  Intra-op Plan:   Post-operative Plan: Extubation in OR  Informed Consent: I have reviewed the patients History and Physical, chart, labs and discussed the procedure including the risks, benefits and alternatives for the proposed anesthesia with the patient or authorized representative who has indicated his/her understanding and acceptance.     Dental advisory given  Plan Discussed with: CRNA and Anesthesiologist  Anesthesia Plan Comments:         Anesthesia Quick Evaluation

## 2022-06-12 NOTE — Transfer of Care (Signed)
Immediate Anesthesia Transfer of Care Note  Patient: [name] [name]  Procedure(s) Performed: BILATERAL LOWER EXTREMITY FASCIOTOMY CLOSURE (Bilateral: Leg Lower)  Patient Location: PACU  Anesthesia Type:General  Level of Consciousness: awake, alert , and oriented  Airway & Oxygen Therapy: Patient Spontanous Breathing  Post-op Assessment: Report given to RN, Post -op Vital signs reviewed and stable, Patient moving all extremities X 4, and Patient able to stick tongue midline  Post vital signs: Reviewed  Last Vitals:  Vitals Value Taken Time  BP 128/80 06/12/22 1135  Temp 97.8   Pulse 177 06/12/22 1139  Resp 14 06/12/22 1139  SpO2 96 % 06/12/22 1139  Vitals shown include unvalidated device data.  Last Pain:  Vitals:   06/12/22 0845  TempSrc: Oral  PainSc: 2       Patients Stated Pain Goal: 3 ([id number])  Complications: No notable events documented.

## 2022-06-12 NOTE — Progress Notes (Addendum)
  Progress Note    06/12/2022 6:39 AM 2 Days Post-Op  Subjective:  feels a little groggy but overall says he feels good.    Afebrile HR 80's-120's afib 99991111 systolic 99991111 RA  Gtts:  none  Vitals:   06/12/22 0500 06/12/22 0600  BP: 114/84 105/66  Pulse: 97 86  Resp: 17 12  Temp:    SpO2: 97% 96%    Physical Exam: General:  resting in no distress Cardiac:  irregular Lungs:  non labored Incisions:  right groin with prevena with good seal; left groin clean- both with ecchymosis.  BLE fasciotomy sites with ace wraps Extremities:  brisk biphasic PT doppler signals bilaterally.  Motor and sensory in tact.  Able to lift legs off bed.    CBC    Component Value Date/Time   WBC 16.3 (H) 06/11/2022 2056   RBC 2.65 (L) 06/11/2022 2056   HGB 8.4 (L) 06/11/2022 2056   HCT 23.9 (L) 06/11/2022 2056   PLT 64 (L) 06/11/2022 2056   MCV 90.2 06/11/2022 2056   MCH 31.7 06/11/2022 2056   MCHC 35.1 06/11/2022 2056   RDW 23.0 (H) 06/11/2022 2056   LYMPHSABS 3.1 06/10/2022 0005   MONOABS 0.9 06/10/2022 0005   EOSABS 0.3 06/10/2022 0005   BASOSABS 0.1 06/10/2022 0005    BMET    Component Value Date/Time   NA 131 (L) 06/12/2022 0455   K 3.7 06/12/2022 0455   CL 104 06/12/2022 0455   CO2 21 (L) 06/12/2022 0455   GLUCOSE 114 (H) 06/12/2022 0455   BUN 22 06/12/2022 0455   CREATININE 0.98 06/12/2022 0455   CALCIUM 7.7 (L) 06/12/2022 0455   GFRNONAA >60 06/12/2022 0455   GFRAA >60 02/21/2018 0641    INR    Component Value Date/Time   INR 1.3 (H) 06/10/2022 0005     Intake/Output Summary (Last 24 hours) at 06/12/2022 [id number] Last data filed at 06/12/2022 0000 Gross per 24 hour  Intake 3634.66 ml  Output 775 ml  Net 2859.66 ml      Assessment/Plan:  67 y.o. male is s/p:  Aortic embolectomy Bilateral iliofemoral embolectomy Bilateral femoral-popliteal embolectomy Bilateral 4 compartment fasciotomy Aortogram Bilateral lower extremity angiogram And Right groin  exploration Right groin hematoma evacuation Myriad more cell powder placement 15 French drain placement Prevena vacuum assisted dressing  2 Days Post-Op   -pt continues to have brisk biphasic PT doppler flow bilaterally with motor and sensory in tact.  Sensory continues to improve subjectively.  -echo revealed EF 40% secondary to Afib.  Otherwise, no atrial or ventricular thrombus.  Pt on Xarelto prior to admission.  Hopeful we can restart this in the next day or two pending hgb and surgery today.  Liver enzymes elevated but slightly improved last evening -thrombocytopenia improved to 64k (received two platelet pheresis packs) -acute blood loss anemia-received 2 units PRBC's yesterday and two PRBC's on 3/24. -DVT prophylaxis:  sq heparin -for OR this morning for fasciotomy washout and closure--pt is npo. -hopeful pt can start to mobilize after surgery today   Leontine Locket, PA-C [facility] [phone] 06/12/2022 6:39 AM  VASCULAR STAFF ADDENDUM: I have independently interviewed and examined the patient. I agree with the above.  OR today for fasciotomy closure. Platelets and RBCs administered this morning.  Plan to close over drains due to thrombocytopenia.    Cassandria Santee, MD [facility] Phone Number: [phone] 06/12/2022 9:26 AM

## 2022-06-13 ENCOUNTER — Encounter (HOSPITAL_COMMUNITY): Payer: Self-pay | Admitting: Vascular Surgery

## 2022-06-13 ENCOUNTER — Other Ambulatory Visit (HOSPITAL_COMMUNITY): Payer: Self-pay

## 2022-06-13 LAB — HEPARIN LEVEL (UNFRACTIONATED): Heparin Unfractionated: 0.67 IU/mL (ref 0.30–0.70)

## 2022-06-13 LAB — CBC
HCT: 30.9 % — ABNORMAL LOW (ref 39.0–52.0)
HCT: 28.9 % — ABNORMAL LOW (ref 39.0–52.0)
Hemoglobin: 10.4 g/dL — ABNORMAL LOW (ref 13.0–17.0)
Hemoglobin: 10.2 g/dL — ABNORMAL LOW (ref 13.0–17.0)
MCH: 31.2 pg (ref 26.0–34.0)
MCH: 32.7 pg (ref 26.0–34.0)
MCHC: 33.7 g/dL (ref 30.0–36.0)
MCHC: 35.3 g/dL (ref 30.0–36.0)
MCV: 92.8 fL (ref 80.0–100.0)
MCV: 92.6 fL (ref 80.0–100.0)
Platelets: 108 10*3/uL — ABNORMAL LOW (ref 150–400)
Platelets: 107 10*3/uL — ABNORMAL LOW (ref 150–400)
RBC: 3.33 MIL/uL — ABNORMAL LOW (ref 4.22–5.81)
RBC: 3.12 MIL/uL — ABNORMAL LOW (ref 4.22–5.81)
RDW: 21.2 % — ABNORMAL HIGH (ref 11.5–15.5)
RDW: 21.5 % — ABNORMAL HIGH (ref 11.5–15.5)
WBC: 24.2 10*3/uL — ABNORMAL HIGH (ref 4.0–10.5)
WBC: 20.9 10*3/uL — ABNORMAL HIGH (ref 4.0–10.5)
nRBC: 0.2 % (ref 0.0–0.2)
nRBC: 0.1 % (ref 0.0–0.2)

## 2022-06-13 LAB — BPAM PLATELET PHERESIS
Blood Product Expiration Date: 202403282359
ISSUE DATE / TIME: 202403260801
Unit Type and Rh: 600

## 2022-06-13 LAB — BASIC METABOLIC PANEL
Anion gap: 6 (ref 5–15)
BUN: 21 mg/dL (ref 8–23)
CO2: 23 mmol/L (ref 22–32)
Calcium: 8.1 mg/dL — ABNORMAL LOW (ref 8.9–10.3)
Chloride: 104 mmol/L (ref 98–111)
Creatinine, Ser: 1.06 mg/dL (ref 0.61–1.24)
GFR, Estimated: >60 mL/min (ref >60)
Glucose, Bld: 168 mg/dL — ABNORMAL HIGH (ref 70–99)
Potassium: 3.9 mmol/L (ref 3.5–5.1)
Sodium: 133 mmol/L — ABNORMAL LOW (ref 135–145)

## 2022-06-13 LAB — PREPARE PLATELET PHERESIS: Unit division: 0

## 2022-06-13 MED ORDER — HEPARIN (PORCINE) 25000 UT/250ML-% IV SOLN
1450.0000 [IU]/h | INTRAVENOUS | Status: DC
  Administered 2022-06-13: 1700 [IU]/h via INTRAVENOUS
  Administered 2022-06-14: 1400 [IU]/h via INTRAVENOUS
  Administered 2022-06-15 – 2022-06-16 (×2): 1150 [IU]/h via INTRAVENOUS
  Administered 2022-06-17 – 2022-06-18 (×2): 1400 [IU]/h via INTRAVENOUS
  Filled 2022-06-13 (×7): qty 250

## 2022-06-13 NOTE — Progress Notes (Signed)
Heart Failure Nurse Navigator Progress Note  PCP: Azzie Glatter, FNP PCP-Cardiologist: None Admission Diagnosis: Acute lower limb ischemia, Atrial fibrillation with controlled ventricular rate.  Admitted from: Home via EMS  Presentation:   [name] [name] presented with abrupt bilateral pain and numbness to both feet and up to knees. Has a history of a CVA in 2019, however hasn't been to a doctor in years per patient. He reports to smoking daily and drinking alcohol 2-3 times per week. BP 178/114, HR 94, EKG with Atrial fibrillation, IV heparin started in ED. CTA showed acute aortic bifurcation occlusion. On 3/24 patient went to OR for Aortic embolectomy, Bilateral iliofemoral embolectomy, Bilateral 4 compartment fasciotomy, aortogram, bilateral lower extremity angiogram.  Patient was educated on the sign and symptoms of heart failure, daily weights, when to call his doctor or go to the ED, Diet/ fluid restrictions, patient reported to drinking alcohol 2-3 times weekly and some soda. Spoke about taking all medications as prescribed and attending all medical appointments. Patient reported to not really liking to go to the doctors or take medication,however now he realizes he really needs too, encourage patient to attend all medical appointments. He is scheduled for a Hf TOC appointment on 07/04/2022 @ 11 am.   ECHO/ LVEF: 40% HFrEF  Clinical Course:  Past Medical History:  Diagnosis Date   A-fib ([place])    Acute ischemic stroke ([place])    Asthma    Gout    Hypertension    MRSA (methicillin resistant staph aureus) culture positive 2009     Social History   Socioeconomic History   Marital status: Married    Spouse name: Not on file   Number of children: Not on file   Years of education: Not on file   Highest education level: Not on file  Occupational History   Not on file  Tobacco Use   Smoking status: Former    Years: 48    Types: Cigarettes    Quit date: 07/21/2017    Years  since quitting: 4.8   Smokeless tobacco: Never  Vaping Use   Vaping Use: Never used  Substance and Sexual Activity   Alcohol use: Yes    Comment: occ   Drug use: Not Currently   Sexual activity: Not on file  Other Topics Concern   Not on file  Social History Narrative   Not on file   Social Determinants of Health   Financial Resource Strain: Not on file  Food Insecurity: Not on file  Transportation Needs: Not on file  Physical Activity: Not on file  Stress: Not on file  Social Connections: Not on file   Education Assessment and Provision:  Detailed education and instructions provided on heart failure disease management including the following:  Signs and symptoms of Heart Failure When to call the physician Importance of daily weights Low sodium diet Fluid restriction Medication management Anticipated future follow-up appointments  Patient education given on each of the above topics.  Patient acknowledges understanding via teach back method and acceptance of all instructions.  Education Materials:  "Living Better With Heart Failure" Booklet, HF zone tool, & Daily Weight Tracker Tool.  Patient has scale at home: Yes Patient has pill box at home: Yes    High Risk Criteria for Readmission and/or Poor Patient Outcomes: Heart failure hospital admissions (last 6 months): 0  No Show rate: 14% Difficult social situation: No Demonstrates medication adherence: yes Primary Language: English Literacy level: Reading, writing, and comprehension  Barriers  of Care:   Diet/ fluids/ daily weights ETOH/ smoking  Continued HF Education   Considerations/Referrals:   Referral made to Heart Failure Pharmacist Stewardship: Yes Referral made to Heart Failure CSW/NCM TOC: No Referral made to Heart & Vascular TOC clinic: Yes, 07/04/2022 @ 11 am.  Items for Follow-up on DC/TOC: Diet/ fluids/ daily weights ETOH/ Smoking cessation Continued HF Education   Earnestine Leys, BSN,  RN Heart Failure Transport planner Only

## 2022-06-13 NOTE — Progress Notes (Signed)
ANTICOAGULATION CONSULT NOTE  Pharmacy Consult for heparin Indication:  Aortic thrombus  Allergies  Allergen Reactions   Cantaloupe (Diagnostic) Other (See Comments)    Patient states it causes his throat to itch   Carrot [Daucus Carota] Other (See Comments)    Patient states it causes his throat to itch   Codeine Nausea And Vomiting    Patient Measurements: Height: 6\' 4"  (193 cm) Weight: 108.9 kg (240 lb) IBW/kg (Calculated) : 86.8 Heparin Dosing Weight: 108.6kg  Vital Signs: Temp: 97.5 F (36.4 C) (03/27 2000) Temp Source: Oral (03/27 2000) BP: 159/89 (03/27 2000) Pulse Rate: 91 (03/27 2000)  Labs: Recent Labs    06/11/22 0134 06/11/22 0455 06/12/22 0455 06/12/22 0647 06/12/22 1904 06/13/22 0031 06/13/22 1058 06/13/22 2211  HGB  --    < >  --    < > 10.7*  --  10.4* 10.2*  HCT  --    < >  --    < > 31.0*  --  30.9* 28.9*  PLT  --    < >  --    < > 101*  --  108* 107*  HEPARINUNFRC 0.53  --  <0.10*  --   --   --   --  0.67  CREATININE  --    < > 0.98  --  1.19 1.06  --   --    < > = values in this interval not displayed.     Estimated Creatinine Clearance: 91.4 mL/min (by C-G formula based on SCr of 1.06 mg/dL).   Medical History: Past Medical History:  Diagnosis Date   A-fib ([place])    Acute ischemic stroke ([place])    Asthma    Gout    Hypertension    MRSA (methicillin resistant staph aureus) culture positive 2009    Medications:  Medications Prior to Admission  Medication Sig Dispense Refill Last Dose   aspirin 325 MG tablet Take 325 mg by mouth daily as needed for mild pain or headache.   Past Week   ibuprofen (ADVIL) 200 MG tablet Take 600-800 mg by mouth daily as needed for mild pain or headache.   Past Week   Multiple Vitamin (MULTIVITAMIN) tablet Take 1 tablet by mouth daily.   Past Week   albuterol (PROVENTIL HFA;VENTOLIN HFA) 108 (90 Base) MCG/ACT inhaler Inhale 2 puffs into the lungs every 6 (six) hours as needed for wheezing or shortness of  breath. (Patient not taking: Reported on 06/12/2022) 1 Inhaler 12 Not Taking   colchicine 0.6 MG tablet Take 1 tablet (0.6 mg total) by mouth daily. (Patient not taking: Reported on 06/12/2022) 30 tablet 2 Not Taking   cyclobenzaprine (FLEXERIL) 10 MG tablet Take 1 tablet (10 mg total) by mouth 3 (three) times daily as needed for muscle spasms. (Patient not taking: Reported on 06/12/2022) 30 tablet 2 Not Taking   lisinopril (PRINIVIL,ZESTRIL) 10 MG tablet Take 1 tablet (10 mg total) by mouth daily. (Patient not taking: Reported on 06/12/2022) 90 tablet 1 Not Taking   metoprolol succinate (TOPROL-XL) 25 MG 24 hr tablet Take 1 tablet (25 mg total) by mouth daily. (Patient not taking: Reported on 06/12/2022) 30 tablet 2 Not Taking   rivaroxaban (XARELTO) 20 MG TABS tablet Take 1 tablet (20 mg total) by mouth daily with supper. 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 He underwent aortic, bilateral iliofemoral, femoral-popliteal embolectomy and post surgery had right groin expanding hematoma- drain was placed and anticoag was restarted. Patient had heparin held again due to platelet drop. Platelets have since increased and pharmacy consulted to dose heparin, no bolus given bleed history.   Heparin level tonight came back therapeutic but at higher end of goal at 0.67, on 1500 units/hr. Hgb 10.2, plt 107. No s/sx of  bleeding or infusion issues per RN.   Goal of Therapy:  Heparin level 0.3-0.7 units/ml Monitor platelets by anticoagulation protocol: Yes   Plan:  Reduce heparin infusion to 1400 units/hr to keep in lower end of goal range given bleeding issues previously  Check anti-Xa level in 6 hours with AM labs and daily while on heparin Continue to monitor H&H and platelets  Antonietta Jewel, PharmD, [place] Pharmacist  Phone: [phone] 06/13/2022 10:34 PM  Please check AMION for all [place] phone numbers After 10:00 PM, call [place] [phone]

## 2022-06-13 NOTE — Progress Notes (Signed)
Occupational Therapy Treatment Patient Details Name: [name] [name] MRN: [id number] DOB: [birth year] Today's Date: 06/13/2022   History of present illness Pt is a 67 yo male admitted to ER with numbness and B leg pain. Pt found to have clots in BLEs and undersent an aortic emoliectomy, B ileofemoral embolectomy, B fem/pop embolectomies, B 4 compartment fasciotomies and a R groin expanding hematoma evacuation surgery. Fasciotomy closer on 3/26. 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   OT comments  Pt making great progress s/p surgery to close fasciotomies.  Pt transferred to chair with +2 max assist to stand and mod assist to pivot. Pt limited due to high HR and blood pressure.  Pt mobilizing well for first time up. Pt has very supportive significant other that he will stay with post d/c.  Feel pt needs intensive post acute rehab before returning home.  Will continue to see with focus on OOB and standing/ambulating during adls when vitals more stable.    Recommendations for follow up therapy are one component of a multi-disciplinary discharge planning process, led by the attending physician.  Recommendations may be updated based on patient status, additional functional criteria and insurance authorization.    Assistance Recommended at Discharge Frequent or constant Supervision/Assistance  Patient can return home with the following  Two people to help with walking and/or transfers;Two people to help with bathing/dressing/bathroom;Assistance with cooking/housework;Assist for transportation;Help with stairs or ramp for entrance   Equipment Recommendations  BSC/3in1;Tub/shower bench    Recommendations for Other Services Rehab consult    Precautions / Restrictions Precautions Precautions: Fall;Other (comment) Precaution Comments: HR up to 200 and BP 186/94 on first attempt up.  Watch vitals closely. Pt with groin wound vac and groin drain Required  Braces or Orthoses: Other Brace Other Brace: B ace wraps to both legs Restrictions Weight Bearing Restrictions: Yes       Mobility Bed Mobility Overal bed mobility: Needs Assistance Bed Mobility: Supine to Sit     Supine to sit: +2 for physical assistance, Max assist     General bed mobility comments: Pt in a lot of pain due to drains and wound vac but once it sitting position tolerated much more activity.    Transfers Overall transfer level: Needs assistance Equipment used: Rolling walker (2 wheels) Transfers: Sit to/from Stand, Bed to chair/wheelchair/BSC Sit to Stand: Max assist, +2 physical assistance Stand pivot transfers: Mod assist, +2 physical assistance         General transfer comment: Bed in high position to assist pt up since pt is 6'4".  Pt with pain putting weight through RLE but was able to with time.     Balance Overall balance assessment: Needs assistance Sitting-balance support: Feet supported Sitting balance-Leahy Scale: Fair     Standing balance support: Bilateral upper extremity supported, Reliant on assistive device for balance, During functional activity Standing balance-Leahy Scale: Poor Standing balance comment: Must have outside assist to stand                           ADL either performed or assessed with clinical judgement   ADL Overall ADL's : Needs assistance/impaired Eating/Feeding: Set up;Sitting   Grooming: Set up;Sitting   Upper Body Bathing: Set up;Sitting   Lower Body Bathing: Maximal assistance;Sit to/from stand;Cueing for compensatory techniques   Upper Body Dressing : Set up;Sitting   Lower Body Dressing: Maximal assistance;+2 for physical assistance;Sit to/from stand  Toilet Transfer: Maximal assistance;+2 for physical assistance;BSC/3in1;Stand-pivot Toilet Transfer Details (indicate cue type and reason): Pivot to [facility] with walker from very high bed Toileting- Clothing Manipulation and Hygiene: Maximal  assistance;+2 for physical assistance;Sit to/from stand Toileting - Clothing Manipulation Details (indicate cue type and reason): Pt stood with PT while OT cleaned pt     Functional mobility during ADLs: Moderate assistance;+2 for physical assistance General ADL Comments: Pt doing much better with all adls mobilizing with B ace wraps on fasciotomy closures    Extremity/Trunk Assessment Upper Extremity Assessment Upper Extremity Assessment: Overall WFL for tasks assessed   Lower Extremity Assessment Lower Extremity Assessment: Defer to PT evaluation        Vision   Vision Assessment?: No apparent visual deficits   Perception Perception Perception: Within Functional Limits   Praxis Praxis Praxis: Intact    Cognition Arousal/Alertness: Awake/alert Behavior During Therapy: WFL for tasks assessed/performed Overall Cognitive Status: Within Functional Limits for tasks assessed                                          Exercises      Shoulder Instructions       General Comments Pt mobilizing well for first time up.    Pertinent Vitals/ Pain       Pain Assessment Pain Assessment: 0-10 Pain Score: 10-Worst pain ever Pain Location: R groin and BLEs Pain Descriptors / Indicators: Burning, Grimacing, Moaning Pain Intervention(s): Monitored during session, Repositioned  Home Living                                          Prior Functioning/Environment              Frequency  Min 2X/week        Progress Toward Goals  OT Goals(current goals can now be found in the care plan section)  Progress towards OT goals: Progressing toward goals  Acute Rehab OT Goals Patient Stated Goal: to get walking OT Goal Formulation: With patient Time For Goal Achievement: 06/25/22 Potential to Achieve Goals: Good ADL Goals Pt Will Perform Grooming: with supervision;standing Pt Will Perform Lower Body Bathing: with min assist;sit to/from  stand Pt Will Perform Lower Body Dressing: with min assist;sit to/from stand Pt Will Transfer to Toilet: with min assist;ambulating;bedside commode;regular height toilet;grab bars Pt/caregiver will Perform Home Exercise Program: Increased strength;Both right and left upper extremity;With theraband;Independently Additional ADL Goal #1: Pt will progress with transfers and mobility for adls as ordered after surgery scheduld for 06/12/22  Plan Discharge plan needs to be updated    Co-evaluation                 AM-PAC OT "6 Clicks" Daily Activity     Outcome Measure   Help from another person eating meals?: None Help from another person taking care of personal grooming?: None Help from another person toileting, which includes using toliet, bedpan, or urinal?: A Lot Help from another person bathing (including washing, rinsing, drying)?: A Lot Help from another person to put on and taking off regular upper body clothing?: A Little Help from another person to put on and taking off regular lower body clothing?: Total 6 Click Score: 16    End of Session Equipment Utilized During Treatment: Rolling walker (2  wheels)  OT Visit Diagnosis: Unsteadiness on feet (R26.81)   Activity Tolerance Patient tolerated treatment well   Patient Left in chair;with call bell/phone within reach   Nurse Communication Mobility status        Time: [id number] OT Time Calculation (min): 50 min  Charges: OT General Charges $OT Visit: 1 Visit OT Treatments $Self Care/Home Management : 38-52 mins    Glenford Peers 06/13/2022, 9:54 AM

## 2022-06-13 NOTE — TOC Initial Note (Signed)
Transition of Care [facility]) - Initial/Assessment Note    Patient Details  Name: [name] [name] MRN: [id number] Date of Birth: [birth year]  Transition of Care [facility]) CM/SW Contact:    Tom-Johnson, Renea Ee, RN Phone Number: 06/13/2022, 1:29 PM  Clinical Narrative:                  Patient is admitted for Bilateral LE Open Fasciotomies, found to have clots in BLEs .Patient underwent Aortic Emoliectomy, B Ileofemoral Embolectomy, B Fem/Pop Embolectomies, B 4 compartment Fasciotomies and a Rt Groin expanding hematoma evacuation. Vascular following.  From home with friends and will discharge home with ex-wife after rehab.CIR recommended, awaiting approval. TOC will continue follow as patient progresses with care towards discharge.    Expected Discharge Plan: IP Rehab Facility Barriers to Discharge: Continued Medical Work up   Patient Goals and CMS Choice Patient states their goals for this hospitalization and ongoing recovery are:: To go to rehab and return home CMS Medicare.gov Compare Post Acute Care list provided to:: Patient Choice offered to / list presented to : Patient      Expected Discharge Plan and Services   Discharge Planning Services: CM Consult Post Acute Care Choice: IP Rehab Living arrangements for the past 2 months: Single Family Home                                      Prior Living Arrangements/Services Living arrangements for the past 2 months: Single Family Home Lives with:: Friends Patient language and need for interpreter reviewed:: Yes Do you feel safe going back to the place where you live?: Yes      Need for Family Participation in Patient Care: Yes (Comment) Care giver support system in place?: Yes (comment)   Criminal Activity/Legal Involvement Pertinent to Current Situation/Hospitalization: No - Comment as needed  Activities of Daily Living      Permission Sought/Granted Permission sought to share information with : Case  Manager, Customer service manager, Family Supports Permission granted to share information with : Yes, Verbal Permission Granted              Emotional Assessment Appearance:: Appears stated age Attitude/Demeanor/Rapport: Engaged, Gracious Affect (typically observed): Accepting, Appropriate, Calm, Hopeful, Pleasant Orientation: : Oriented to Self, Oriented to Place, Oriented to  Time, Oriented to Situation Alcohol / Substance Use: Not Applicable Psych Involvement: No (comment)  Admission diagnosis:  Status post surgery [Z98.890] Atrial fibrillation with controlled ventricular rate (HCC) [I48.91] Acute lower limb ischemia [I99.8] 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 ([place]) [I74.09] Patient Active Problem List   Diagnosis Date Noted   Status post surgery 06/10/2022   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 ([place]) 06/10/2022   Hypertension 04/07/2018   Acute ischemic stroke ([place]) 02/22/2018   Paroxysmal atrial fibrillation ([place]) 02/20/2018   PCP:  Azzie Glatter, FNP Pharmacy:   [facility] ([place]), [place] - [address] [place] [address] Phone: [phone] Fax: [place] [address] [place] [address] Phone: [phone] Fax: [phone]  [id number] - [place], [place] - [address] [place] [address] Phone: [phone] Fax: [phone]     Social Determinants of Health (SDOH) Social History: SDOH Screenings   Food Insecurity: No  Food Insecurity (06/13/2022)  Housing: Low Risk  (06/13/2022)  Transportation Needs: No Transportation Needs (06/13/2022)  Alcohol Screen: Low Risk  (06/13/2022)  Depression (PHQ2-9): Low Risk  (03/07/2018)  Financial Resource Strain: Low Risk  (06/13/2022)  Tobacco Use: High Risk  (06/13/2022)   SDOH Interventions: Food Insecurity Interventions: Intervention Not Indicated Housing Interventions: Intervention Not Indicated Transportation Interventions: Intervention Not Indicated, Inpatient TOC, Patient Resources (Friends/Family) Alcohol Usage Interventions: Intervention Not Indicated (Score <7) Financial Strain Interventions: Intervention Not Indicated   Readmission Risk Interventions     No data to display

## 2022-06-13 NOTE — Progress Notes (Addendum)
  Progress Note    06/13/2022 6:42 AM  Subjective:  says his legs are feeling better.  He has a burning in his feet.  He has not been out of bed.    Afebrile HR 70's-90's afib [id number] systolic [id number] RA  Vitals:   06/13/22 0500 06/13/22 0600  BP: 137/80 139/80  Pulse: 71 86  Resp: 14 (!) 21  Temp:    SpO2: 96% 96%    Physical Exam: General:  sitting up in bed eating.  Cardiac:  irregular Lungs:  non labored Extremities:  brisk doppler flow bilateral PT; monophasic AT bilaterally  CBC    Component Value Date/Time   WBC 13.7 (H) 06/12/2022 1904   RBC 3.43 (L) 06/12/2022 1904   HGB 10.7 (L) 06/12/2022 1904   HCT 31.0 (L) 06/12/2022 1904   PLT 101 (L) 06/12/2022 1904   MCV 90.4 06/12/2022 1904   MCH 31.2 06/12/2022 1904   MCHC 34.5 06/12/2022 1904   RDW 21.2 (H) 06/12/2022 1904   LYMPHSABS 3.1 06/10/2022 0005   MONOABS 0.9 06/10/2022 0005   EOSABS 0.3 06/10/2022 0005   BASOSABS 0.1 06/10/2022 0005    BMET    Component Value Date/Time   NA 133 (L) 06/13/2022 0031   K 3.9 06/13/2022 0031   CL 104 06/13/2022 0031   CO2 23 06/13/2022 0031   GLUCOSE 168 (H) 06/13/2022 0031   BUN 21 06/13/2022 0031   CREATININE 1.06 06/13/2022 0031   CALCIUM 8.1 (L) 06/13/2022 0031   GFRNONAA >60 06/13/2022 0031   GFRAA >60 02/21/2018 0641    INR    Component Value Date/Time   INR 1.3 (H) 06/10/2022 0005     Intake/Output Summary (Last 24 hours) at 06/13/2022 [id number] Last data filed at 06/13/2022 0600 Gross per 24 hour  Intake 1263.26 ml  Output 1445 ml  Net -181.74 ml      Assessment/Plan:  67 y.o. male is s/p:  Aortic embolectomy Bilateral iliofemoral embolectomy Bilateral femoral-popliteal embolectomy Bilateral 4 compartment fasciotomy Aortogram Bilateral lower extremity angiogram And Right groin exploration Right groin hematoma evacuation Myriad more cell powder placement 15 French drain placement Prevena vacuum assisted dressing 3 Days  Post-Op And Bilateral lower extremity low leg fasciotomy closure  1 Day Post-Op   -pt continues to have brisk doppler flow bilateral PT and motor and sensory in tact.   -pt eating breakfast - will come back later this morning and remove ace wraps and check groin incisions.  -thrombocytopenia improved to 101k last evening - he received one platelet pheresis pack yesterday -acute blood loss anemia improved to 10.7 from 8.3 last evening -DVT prophylaxis:  sq heparin -will discontinue foley, get PT/OT eval   Leontine Locket, PA-C [facility] [phone] 06/13/2022 6:42 AM  VASCULAR STAFF ADDENDUM: I have independently interviewed and examined the patient. I agree with the above.  Thigh drain pulled.  Foley out OOB Heparin gtt without bolus   Cassandria Santee, MD [facility] Phone Number: [phone] 06/13/2022 3:23 PM

## 2022-06-13 NOTE — Progress Notes (Addendum)
ANTICOAGULATION CONSULT NOTE - Initial Consult  Pharmacy Consult for heparin Indication:  Aortic thrombus  Allergies  Allergen Reactions   Cantaloupe (Diagnostic) Other (See Comments)    Patient states it causes his throat to itch   Carrot [Daucus Carota] Other (See Comments)    Patient states it causes his throat to itch   Codeine Nausea And Vomiting    Patient Measurements: Height: 6\' 4"  (193 cm) Weight: 108.9 kg (240 lb) IBW/kg (Calculated) : 86.8 Heparin Dosing Weight: 108.6kg  Vital Signs: Temp: 99.5 F (37.5 C) (03/27 0822) BP: 164/93 (03/27 0905) Pulse Rate: 109 (03/27 0905)  Labs: Recent Labs    06/10/22 1421 06/10/22 2116 06/11/22 0134 06/11/22 0455 06/11/22 2056 06/12/22 0455 06/12/22 0647 06/12/22 1904 06/13/22 0031  HGB  --    < >  --    < > 8.4*  --  8.3* 10.7*  --   HCT  --    < >  --    < > 23.9*  --  23.8* 31.0*  --   PLT  --    < >  --    < > 64*  --  64* 101*  --   HEPARINUNFRC 0.35  --  0.53  --   --  <0.10*  --   --   --   CREATININE  --    < >  --    < > 1.05 0.98  --  1.19 1.06   < > = values in this interval not displayed.    Estimated Creatinine Clearance: 91.4 mL/min (by C-G formula based on SCr of 1.06 mg/dL).   Medical History: Past Medical History:  Diagnosis Date   A-fib ([place])    Acute ischemic stroke ([place])    Asthma    Gout    Hypertension    MRSA (methicillin resistant staph aureus) culture positive 2009    Medications:  Medications Prior to Admission  Medication Sig Dispense Refill Last Dose   aspirin 325 MG tablet Take 325 mg by mouth daily as needed for mild pain or headache.   Past Week   ibuprofen (ADVIL) 200 MG tablet Take 600-800 mg by mouth daily as needed for mild pain or headache.   Past Week   Multiple Vitamin (MULTIVITAMIN) tablet Take 1 tablet by mouth daily.   Past Week   albuterol (PROVENTIL HFA;VENTOLIN HFA) 108 (90 Base) MCG/ACT inhaler Inhale 2 puffs into the lungs every 6 (six) hours as needed for  wheezing or shortness of breath. (Patient not taking: Reported on 06/12/2022) 1 Inhaler 12 Not Taking   colchicine 0.6 MG tablet Take 1 tablet (0.6 mg total) by mouth daily. (Patient not taking: Reported on 06/12/2022) 30 tablet 2 Not Taking   cyclobenzaprine (FLEXERIL) 10 MG tablet Take 1 tablet (10 mg total) by mouth 3 (three) times daily as needed for muscle spasms. (Patient not taking: Reported on 06/12/2022) 30 tablet 2 Not Taking   lisinopril (PRINIVIL,ZESTRIL) 10 MG tablet Take 1 tablet (10 mg total) by mouth daily. (Patient not taking: Reported on 06/12/2022) 90 tablet 1 Not Taking   metoprolol succinate (TOPROL-XL) 25 MG 24 hr tablet Take 1 tablet (25 mg total) by mouth daily. (Patient not taking: Reported on 06/12/2022) 30 tablet 2 Not Taking   rivaroxaban (XARELTO) 20 MG TABS tablet Take 1 tablet (20 mg total) by mouth daily with supper. (Patient not taking: Reported on 06/12/2022) 30 tablet 2 Not Taking   Scheduled:   atorvastatin  40 mg  Oral Daily   Chlorhexidine Gluconate Cloth  6 each Topical Daily   colchicine  0.6 mg Oral Daily   docusate sodium  100 mg Oral Daily   folic acid  1 mg Oral Daily   heparin injection (subcutaneous)  5,000 Units Subcutaneous Q8H   lisinopril  10 mg Oral Daily   metoprolol succinate  25 mg Oral Daily   multivitamin with minerals  1 tablet Oral Daily   pantoprazole  40 mg Oral Daily   thiamine  100 mg Oral Daily   Assessment: 33 yoM with PMH of afib, noncompliant with AC, remote hx CVA, tobacco + marijuana, EtOH who was 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 He underwent aortic, bilateral iliofemoral, femoral-popliteal embolectomy and post surgery had right groin expanding hematoma- drain was placed and anticoag was restarted. Patient had heparin held again due to platelet drop. Platelets have since increased and pharmacy consulted to dose heparin, no bolus given bleed history. Plts  41>101, Hgb 8.4>10.7. Patient had no current bleeding documented.   Goal of Therapy:  Heparin level 0.3-0.7 units/ml Monitor platelets by anticoagulation protocol: Yes   Plan:  No bolus given past bleeding history Start heparin infusion at 1700 units/hr (~16 units/kg/hr) Check anti-Xa level in 6 hours and daily while on heparin Continue to monitor H&H and platelets  Sandford Craze, PharmD. [facility] PGY-1 06/13/2022 11:32 AM   ADDENDUM -Patient was previously therapeutic on heparin 1500 units/hr, will dose decrease given history of previous bleeding.  Sandford Craze, PharmD. [facility] Acute Care PGY-1  06/13/2022 2:49 PM

## 2022-06-13 NOTE — Progress Notes (Signed)
Inpatient Rehab Admissions Coordinator Note:   Per therapy recommendations patient was screened for CIR candidacy by Michel Santee, PT. At this time, pt appears to be a potential candidate for CIR. I will place an order for rehab consult for full assessment, per our protocol.  Please contact me any with questions.Shann Medal, PT, DPT [phone] 06/13/22 4:49 PM

## 2022-06-13 NOTE — Evaluation (Addendum)
Physical Therapy Evaluation Patient Details Name: [name] [name] MRN: [id number] DOB: 05-28-1955 Today's Date: 06/13/2022  History of Present Illness  Pt is a 67 yo male admitted to ER with numbness and B leg pain. Pt found to have clots in BLEs and undersent an aortic emoliectomy, B ileofemoral embolectomy, B fem/pop embolectomies, B 4 compartment fasciotomies and a R groin expanding hematoma evacuation surgery.  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  Clinical Impression  PTA pt living with friends and completely independent. Pt plans to recuperate at ex-wife's house, which is level entry and one level. Pt is currently limited in safe mobility by pain in bilateral LE and R groin, decreased ROM from surgical intervention, in presence of A-fib with HR to 200s with increased pain of mobilizing to chair. Pt is maxA for bed mobility, maxAx2 for transfers and modAx2 for stepping to chair. Pt has increased desire to return to prior level of independence and get back to spending time with his grandchildren. Family can provided 24 hour assist at discharge. Patient will benefit from intensive inpatient follow up therapy, >3 hours/day. PT will continue to work with pt to progress mobility.       Recommendations for follow up therapy are one component of a multi-disciplinary discharge planning process, led by the attending physician.  Recommendations may be updated based on patient status, additional functional criteria and insurance authorization.     Assistance Recommended at Discharge Intermittent Supervision/Assistance  Patient can return home with the following  Two people to help with walking and/or transfers;Two people to help with bathing/dressing/bathroom;Assistance with cooking/housework;Direct supervision/assist for medications management;Direct supervision/assist for financial management;Assist for transportation;Help with stairs or ramp for  entrance    Equipment Recommendations Rolling walker (2 wheels);BSC/3in1  Recommendations for Other Services  Rehab consult    Functional Status Assessment Patient has had a recent decline in their functional status and demonstrates the ability to make significant improvements in function in a reasonable and predictable amount of time.     Precautions / Restrictions Precautions Precautions: Fall Precaution Comments: HR up to 200 and BP 186/94 on first attempt up. Watch vitals closely. Pt with groin wound vac and groin drain Required Braces or Orthoses: Other Brace Other Brace: B ace wraps to both legs Restrictions Weight Bearing Restrictions: Yes      Mobility  Bed Mobility Overal bed mobility: Needs Assistance Bed Mobility: Supine to Sit     Supine to sit: +2 for physical assistance, Max assist     General bed mobility comments: Pt in a lot of pain due to drains and wound vac but once it sitting position tolerated much more activity.    Transfers Overall transfer level: Needs assistance Equipment used: Rolling walker (2 wheels) Transfers: Sit to/from Stand, Bed to chair/wheelchair/BSC Sit to Stand: Max assist, +2 physical assistance   Step pivot transfers: Mod assist, +2 physical assistance       General transfer comment: Bed in high position to assist pt up since pt is 6'4". Pt with pain putting weight through RLE but was able to with time.        Balance Overall balance assessment: Needs assistance Sitting-balance support: Feet supported Sitting balance-Leahy Scale: Fair     Standing balance support: Bilateral upper extremity supported, Reliant on assistive device for balance, During functional activity Standing balance-Leahy Scale: Poor Standing balance comment: Must have outside assist to stand  Pertinent Vitals/Pain Pain Assessment Pain Assessment: 0-10 Pain Score: 10-Worst pain ever Pain Location: R groin and  BLEs Pain Descriptors / Indicators: Cramping, Discomfort, Grimacing, Guarding, Sharp, Shooting, Sore Pain Intervention(s): Monitored during session    Home Living Family/patient expects to be discharged to:: Private residence Living Arrangements: Spouse/significant other;Children Available Help at Discharge: Family;Available 24 hours/day Type of Home: House Home Access: Level entry       Home Layout: One level Home Equipment: Crutches;Wheelchair - manual Additional Comments: Pt lives with roommate and is separated from wife but most likely will return home with wife bc her home is more accessible and his daughter is there to assist as needed.    Prior Function Prior Level of Function : Independent/Modified Independent                     Hand Dominance   Dominant Hand: Right    Extremity/Trunk Assessment   Upper Extremity Assessment Upper Extremity Assessment: Defer to OT evaluation    Lower Extremity Assessment Lower Extremity Assessment: RLE deficits/detail;LLE deficits/detail RLE Deficits / Details: R groin wound vac, and JP drain, surgically closed fasciotomies, decreased R hip movement due to vac and pain, strength grossly 3+/5, ROM limited by pain and edema LLE Deficits / Details: surgically closed fasciotomies, strength grossly 3+/5, ROM limited by pain and edema    Cervical / Trunk Assessment Cervical / Trunk Assessment: Normal  Communication   Communication: No difficulties  Cognition Arousal/Alertness: Awake/alert Behavior During Therapy: WFL for tasks assessed/performed Overall Cognitive Status: Within Functional Limits for tasks assessed                                          General Comments General comments (skin integrity, edema, etc.): Pt mobilizing well for first time up. Window left in the compression wrapping at bilateral heels, wraps removed and rewrapped to reduce build up of swelling in the heels. Dressing intact, limited  drainage        Assessment/Plan    PT Assessment Patient needs continued PT services  PT Problem List Decreased strength;Decreased range of motion;Decreased activity tolerance;Decreased balance;Decreased mobility;Cardiopulmonary status limiting activity;Decreased skin integrity;Pain       PT Treatment Interventions DME instruction;Gait training;Functional mobility training;Therapeutic activities;Therapeutic exercise;Balance training;Patient/family education    PT Goals (Current goals can be found in the Care Plan section)  Acute Rehab PT Goals Patient Stated Goal: get back walking PT Goal Formulation: With patient Time For Goal Achievement: 06/27/22 Potential to Achieve Goals: Fair    Frequency Min 1X/week        AM-PAC PT "6 Clicks" Mobility  Outcome Measure Help needed turning from your back to your side while in a flat bed without using bedrails?: A Little Help needed moving from lying on your back to sitting on the side of a flat bed without using bedrails?: Total Help needed moving to and from a bed to a chair (including a wheelchair)?: Total Help needed standing up from a chair using your arms (e.g., wheelchair or bedside chair)?: Total Help needed to walk in hospital room?: Total Help needed climbing 3-5 steps with a railing? : Total 6 Click Score: 8    End of Session Equipment Utilized During Treatment: Gait belt Activity Tolerance: Patient limited by pain Patient left: in chair;with call bell/phone within reach;with nursing/sitter in room Nurse Communication: Mobility status;Other (comment) (HR in Afib  during transfer with max noted 205bpm with sitting down in chair after transfer) PT Visit Diagnosis: Unsteadiness on feet (R26.81);Other abnormalities of gait and mobility (R26.89);Muscle weakness (generalized) (M62.81);Difficulty in walking, not elsewhere classified (R26.2);Pain Pain - Right/Left:  (bilateral) Pain - part of body: Leg    Time: [id number] PT Time  Calculation (min) (ACUTE ONLY): 50 min   Charges:   PT Evaluation $PT Eval Moderate Complexity: 1 Mod PT Treatments $Therapeutic Activity: 8-22 mins        Yuliya Nova B. Migdalia Dk PT, DPT Acute Rehabilitation Services Please use secure chat or  Call Office [phone]   [place] 06/13/2022, 11:16 AM

## 2022-06-14 ENCOUNTER — Other Ambulatory Visit (HOSPITAL_COMMUNITY): Payer: Self-pay

## 2022-06-14 LAB — TYPE AND SCREEN
ABO/RH(D): A NEG
Antibody Screen: NEGATIVE
Unit division: 0
Unit division: 0
Unit division: 0
Unit division: 0
Unit division: 0
Unit division: 0
Unit division: 0
Unit division: 0
Unit division: 0
Unit division: 0

## 2022-06-14 LAB — BPAM RBC
Blood Product Expiration Date: 202404032359
Blood Product Expiration Date: 202404142359
Blood Product Expiration Date: 202404202359
Blood Product Expiration Date: 202404202359
Blood Product Expiration Date: 202404212359
Blood Product Expiration Date: 202404212359
Blood Product Expiration Date: 202404212359
Blood Product Expiration Date: 202404212359
Blood Product Expiration Date: 202404222359
Blood Product Expiration Date: 202404222359
ISSUE DATE / TIME: 202403241141
ISSUE DATE / TIME: 202403241218
ISSUE DATE / TIME: 202403241218
ISSUE DATE / TIME: 202403241218
ISSUE DATE / TIME: 202403251044
ISSUE DATE / TIME: 202403251227
ISSUE DATE / TIME: 202403251227
ISSUE DATE / TIME: 202403251652
ISSUE DATE / TIME: 202403260846
ISSUE DATE / TIME: 202403260846
Unit Type and Rh: 5100
Unit Type and Rh: 5100
Unit Type and Rh: 5100
Unit Type and Rh: 5100
Unit Type and Rh: 5100
Unit Type and Rh: 5100
Unit Type and Rh: 600
Unit Type and Rh: 600
Unit Type and Rh: 600
Unit Type and Rh: 600

## 2022-06-14 LAB — CBC
HCT: 30.8 % — ABNORMAL LOW (ref 39.0–52.0)
Hemoglobin: 10.3 g/dL — ABNORMAL LOW (ref 13.0–17.0)
MCH: 31.5 pg (ref 26.0–34.0)
MCHC: 33.4 g/dL (ref 30.0–36.0)
MCV: 94.2 fL (ref 80.0–100.0)
Platelets: 103 10*3/uL — ABNORMAL LOW (ref 150–400)
RBC: 3.27 MIL/uL — ABNORMAL LOW (ref 4.22–5.81)
RDW: 21.5 % — ABNORMAL HIGH (ref 11.5–15.5)
WBC: 19.1 10*3/uL — ABNORMAL HIGH (ref 4.0–10.5)
nRBC: 0.1 % (ref 0.0–0.2)

## 2022-06-14 LAB — HEPARIN LEVEL (UNFRACTIONATED)
Heparin Unfractionated: 0.79 IU/mL — ABNORMAL HIGH (ref 0.30–0.70)
Heparin Unfractionated: 0.62 IU/mL (ref 0.30–0.70)

## 2022-06-14 MED ORDER — ASPIRIN 81 MG PO TBEC
81.0000 mg | DELAYED_RELEASE_TABLET | Freq: Every day | ORAL | Status: DC
  Administered 2022-06-14 – 2022-06-19 (×6): 81 mg via ORAL
  Filled 2022-06-14 (×6): qty 1

## 2022-06-14 MED ORDER — SPIRONOLACTONE 25 MG PO TABS
25.0000 mg | ORAL_TABLET | Freq: Every day | ORAL | Status: DC
  Administered 2022-06-14 – 2022-06-18 (×5): 25 mg via ORAL
  Filled 2022-06-14 (×5): qty 1

## 2022-06-14 NOTE — Progress Notes (Addendum)
ANTICOAGULATION CONSULT NOTE-Follow Up  Pharmacy Consult for heparin Indication:  Aortic thrombus  Allergies  Allergen Reactions   Cantaloupe (Diagnostic) Other (See Comments)    Patient states it causes his throat to itch   Carrot [Daucus Carota] Other (See Comments)    Patient states it causes his throat to itch   Codeine Nausea And Vomiting    Patient Measurements: Height: 6\' 4"  (193 cm) Weight: 108.9 kg (240 lb) IBW/kg (Calculated) : 86.8 Heparin Dosing Weight: 108.6kg  Vital Signs: Temp: 98.6 F (37 C) (03/28 0400) Temp Source: Oral (03/28 0400) BP: 164/102 (03/28 0600) Pulse Rate: 82 (03/28 0600)  Labs: Recent Labs    06/12/22 0455 06/12/22 0647 06/12/22 1904 06/13/22 0031 06/13/22 1058 06/13/22 2211 06/14/22 0549  HGB  --    < > 10.7*  --  10.4* 10.2* 10.3*  HCT  --    < > 31.0*  --  30.9* 28.9* 30.8*  PLT  --    < > 101*  --  108* 107* 103*  HEPARINUNFRC <0.10*  --   --   --   --  0.67 0.79*  CREATININE 0.98  --  1.19 1.06  --   --   --    < > = values in this interval not displayed.    Estimated Creatinine Clearance: 91.4 mL/min (by C-G formula based on SCr of 1.06 mg/dL).   Medical History: Past Medical History:  Diagnosis Date   A-fib ([place])    Acute ischemic stroke ([place])    Asthma    Gout    Hypertension    MRSA (methicillin resistant staph aureus) culture positive 2009    Medications:  Medications Prior to Admission  Medication Sig Dispense Refill Last Dose   aspirin 325 MG tablet Take 325 mg by mouth daily as needed for mild pain or headache.   Past Week   ibuprofen (ADVIL) 200 MG tablet Take 600-800 mg by mouth daily as needed for mild pain or headache.   Past Week   Multiple Vitamin (MULTIVITAMIN) tablet Take 1 tablet by mouth daily.   Past Week   albuterol (PROVENTIL HFA;VENTOLIN HFA) 108 (90 Base) MCG/ACT inhaler Inhale 2 puffs into the lungs every 6 (six) hours as needed for wheezing or shortness of breath. (Patient not taking:  Reported on 06/12/2022) 1 Inhaler 12 Not Taking   colchicine 0.6 MG tablet Take 1 tablet (0.6 mg total) by mouth daily. (Patient not taking: Reported on 06/12/2022) 30 tablet 2 Not Taking   cyclobenzaprine (FLEXERIL) 10 MG tablet Take 1 tablet (10 mg total) by mouth 3 (three) times daily as needed for muscle spasms. (Patient not taking: Reported on 06/12/2022) 30 tablet 2 Not Taking   lisinopril (PRINIVIL,ZESTRIL) 10 MG tablet Take 1 tablet (10 mg total) by mouth daily. (Patient not taking: Reported on 06/12/2022) 90 tablet 1 Not Taking   metoprolol succinate (TOPROL-XL) 25 MG 24 hr tablet Take 1 tablet (25 mg total) by mouth daily. (Patient not taking: Reported on 06/12/2022) 30 tablet 2 Not Taking   rivaroxaban (XARELTO) 20 MG TABS tablet Take 1 tablet (20 mg total) by mouth daily with supper. 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 He underwent aortic, bilateral iliofemoral, femoral-popliteal embolectomy and post surgery had right groin expanding hematoma- drain was placed and anticoag was restarted. Patient had heparin held again due to platelet drop. Platelets have since increased and pharmacy consulted to dose heparin, no bolus given bleed history.   Heparin level tonight came back supra-therapeutic  at 0.79, on 1400 units/hr. Hgb ~10, plt low 100s. No s/sx of bleeding or infusion issues per RN.    Goal of Therapy:  Heparin level 0.3-0.7 units/ml Monitor platelets by anticoagulation protocol: Yes   Plan:  Reduce heparin infusion to 1250 units/hr  Check anti-Xa level in 6 hours with AM labs and daily while on heparin Continue to monitor H&H and platelets  Sandford Craze, PharmD. [facility] Acute Care PGY-1  06/14/2022 9:49 AM

## 2022-06-14 NOTE — Progress Notes (Addendum)
Progress Note    06/14/2022 6:43 AM 2 Days Post-Op  Subjective:  had a BM this morning and feels better.  Says his feet still feel good.  Been voiding well since foley removed.   Afebrile HR 70's-110's afib [id number] systolic 95 % RA  Gtts: heparin  Vitals:   06/14/22 0300 06/14/22 0400  BP: (!) 154/91 (!) 148/97  Pulse: 75 89  Resp: (!) 25 (!) 30  Temp:  98.6 F (37 C)  SpO2: 98% 94%    Physical Exam: General:  no distress; sitting up in bed eating breakfast Cardiac:  irregular Lungs:  non labored Incisions:  right groin with Prevena vac in tact and left groin looks fine.   Extremities:  + doppler flow bilateral PT; motor and sensory in tact Abdomen:  soft  CBC    Component Value Date/Time   WBC 20.9 (H) 06/13/2022 2211   RBC 3.12 (L) 06/13/2022 2211   HGB 10.2 (L) 06/13/2022 2211   HCT 28.9 (L) 06/13/2022 2211   PLT 107 (L) 06/13/2022 2211   MCV 92.6 06/13/2022 2211   MCH 32.7 06/13/2022 2211   MCHC 35.3 06/13/2022 2211   RDW 21.5 (H) 06/13/2022 2211   LYMPHSABS 3.1 06/10/2022 0005   MONOABS 0.9 06/10/2022 0005   EOSABS 0.3 06/10/2022 0005   BASOSABS 0.1 06/10/2022 0005    BMET    Component Value Date/Time   NA 133 (L) 06/13/2022 0031   K 3.9 06/13/2022 0031   CL 104 06/13/2022 0031   CO2 23 06/13/2022 0031   GLUCOSE 168 (H) 06/13/2022 0031   BUN 21 06/13/2022 0031   CREATININE 1.06 06/13/2022 0031   CALCIUM 8.1 (L) 06/13/2022 0031   GFRNONAA >60 06/13/2022 0031   GFRAA >60 02/21/2018 0641    INR    Component Value Date/Time   INR 1.3 (H) 06/10/2022 0005     Intake/Output Summary (Last 24 hours) at 06/14/2022 [id number] Last data filed at 06/14/2022 0400 Gross per 24 hour  Intake 234.8 ml  Output 930 ml  Net -695.2 ml      Assessment/Plan:  67 y.o. male is s/p:  Aortic embolectomy Bilateral iliofemoral embolectomy Bilateral femoral-popliteal embolectomy Bilateral 4 compartment fasciotomy Aortogram Bilateral lower extremity  angiogram And Right groin exploration Right groin hematoma evacuation Myriad more cell powder placement 15 French drain placement Prevena vacuum assisted dressing 4 Days Post-Op And Bilateral lower extremity low leg fasciotomy closure   2 Days Post-Op   -pt continues to have brisk PT doppler flow -labs late last evening reveal still with thrombocytopenia but stable at 107k.  Leukocytosis improved to 20.9k last evening from 24.2 yesterday morning.  Acute blood loss anemia is stable.  -pt worked with PT and OT yesterday and CIR consult was placed.  -DVT prophylaxis:  heparin gtt-may be able to transition to [place] soon.   -discussed importance of continuing with smoking cessation-discussed he has a good head start being in the hospital. -transfer to [place], [place] [facility] [phone] 06/14/2022 6:43 AM  VASCULAR STAFF ADDENDUM: I have independently interviewed and examined the patient. I agree with the above.  Right PT palpable, Left triphasic Dressings still in place on fasciotomies, will plan removal tomorrow OOB, PT Continue heparin as it is reversible should bleeding occur.  Ace wraps to legs, Elevated when in bed.    Cassandria Santee, MD [facility] Phone Number: [phone] 06/14/2022 1:14 PM

## 2022-06-14 NOTE — Discharge Instructions (Addendum)
 [facility] [place]  Discharge instructions  Lower Extremity Bypass Surgery  Please refer to the following instruction for your post-procedure care. Your surgeon or physician assistant will discuss any changes with you.  Activity  You are encouraged to walk as much as you can. You can slowly return to normal activities during the month after your surgery. Avoid strenuous activity and heavy lifting until your doctor tells you it's OK. Avoid activities such as vacuuming or swinging a golf club. Do not drive until your doctor give the OK and you are no longer taking prescription pain medications. It is also normal to have difficulty with sleep habits, eating and bowel movement after surgery. These will go away with time.  Bathing/Showering  Shower daily after you go home. Do not soak in a bathtub, hot tub, or swim until the incision heals completely.  Incision Care  Clean your incision with mild soap and water. Shower every day. Pat the area dry with a clean towel. You do not need a bandage unless otherwise instructed. Do not apply any ointments or creams to your incision. If you have open wounds you will be instructed how to care for them or a visiting nurse may be arranged for you. If you have staples or sutures along your incision they will be removed at your post-op appointment. You may have skin glue on your incision. Do not peel it off. It will come off on its own in about one week.  Wash the groin wound with soap and water daily and pat dry. (No tub bath-only shower)  Then put a dry gauze or washcloth in the groin to keep this area dry to help prevent wound infection.  Do this daily and as needed.  Do not use Vaseline or neosporin on your incisions.  Only use soap and water on your incisions and then protect and keep dry.  Diet  Resume your normal diet. There are no special food restrictions following this procedure. A low fat/ low cholesterol diet is  recommended for all patients with vascular disease. In order to heal from your surgery, it is CRITICAL to get adequate nutrition. Your body requires vitamins, minerals, and protein. Vegetables are the best source of vitamins and minerals. Vegetables also provide the perfect balance of protein. Processed food has little nutritional value, so try to avoid this.  Medications  Resume taking all your medications unless your doctor or physician assistant tells you not to. If your incision is causing pain, you may take over-the-counter pain relievers such as acetaminophen (Tylenol). If you were prescribed a stronger pain medication, please aware these medication can cause nausea and constipation. Prevent nausea by taking the medication with a snack or meal. Avoid constipation by drinking plenty of fluids and eating foods with high amount of fiber, such as fruits, vegetables, and grains. Take Colace 100 mg (an over-the-counter stool softener) twice a day as needed for constipation.  Do not take Tylenol if you are taking prescription pain medications.  Follow Up  Our office will schedule a follow up appointment 2-3 weeks following discharge.  Please call us immediately for any of the following conditions  Severe or worsening pain in your legs or feet while at rest or while walking Increase pain, redness, warmth, or drainage (pus) from your incision site(s) Fever of 101 degree or higher The swelling in your leg with the bypass suddenly worsens and becomes more painful than when you were in the hospital If you have   been instructed to feel your graft pulse then you should do so every day. If you can no longer feel this pulse, call the office immediately. Not all patients are given this instruction.  Leg swelling is common after leg bypass surgery.  The swelling should improve over a few months following surgery. To improve the swelling, you may elevate your legs above the level of your heart while you are  sitting or resting. Your surgeon or physician assistant may ask you to apply an ACE wrap or wear compression (TED) stockings to help to reduce swelling.  Reduce your risk of vascular disease  Stop smoking. If you would like help call [facility] at 1-800-QUIT-NOW 405-393-9659) or [place] at (534) 420-7980.  Manage your cholesterol Maintain a desired weight Control your diabetes weight Control your diabetes Keep your blood pressure down  If you have any questions, please call the office at [phone]    Information on my medicine - ELIQUIS (apixaban)  This medication education was reviewed with me or my healthcare representative as part of my discharge preparation.    Why was Eliquis prescribed for you? Eliquis was prescribed for you to reduce the risk of a blood clot forming that can cause a stroke if you have a medical condition called atrial fibrillation (a type of irregular heartbeat).  What do You need to know about Eliquis ? Take your Eliquis TWICE DAILY - one tablet in the morning and one tablet in the evening with or without food. If you have difficulty swallowing the tablet whole please discuss with your pharmacist how to take the medication safely.  Take Eliquis exactly as prescribed by your doctor and DO NOT stop taking Eliquis without talking to the doctor who prescribed the medication.  Stopping may increase your risk of developing a stroke.  Refill your prescription before you run out.  After discharge, you should have regular check-up appointments with your healthcare provider that is prescribing your Eliquis.  In the future your dose may need to be changed if your kidney function or weight changes by a significant amount or as you get older.  What do you do if you miss a dose? If you miss a dose, take it as soon as you remember on the same day and resume taking twice daily.  Do not take more than one dose of ELIQUIS at the same time to make up a missed  dose.  Important Safety Information A possible side effect of Eliquis is bleeding. You should call your healthcare provider right away if you experience any of the following: Bleeding from an injury or your nose that does not stop. Unusual colored urine (red or dark brown) or unusual colored stools (red or black). Unusual bruising for unknown reasons. A serious fall or if you hit your head (even if there is no bleeding).  Some medicines may interact with Eliquis and might increase your risk of bleeding or clotting while on Eliquis. To help avoid this, consult your healthcare provider or pharmacist prior to using any new prescription or non-prescription medications, including herbals, vitamins, non-steroidal anti-inflammatory drugs (NSAIDs) and supplements.  This website has more information on Eliquis (apixaban): http://www.eliquis.com/eliquis/home

## 2022-06-14 NOTE — Progress Notes (Signed)
  Inpatient Rehabilitation Admissions Coordinator   Met with patient at bedside for rehab assessment. We discussed goals and expectations of a possible CIR admit. He prefers direct discharge home if he progresses well over the next few days. He and his wife are separated for years. She lives in their home in [place] and he lives in their second home in [place]. He plans to discharge to their home in [place] which is a one level ranch style home for easier mobility access, where is 68 year old daughter lives with his grandchildren. He will discuss options with his wife and daughter and I will follow up tomorrow with their venue preference.  Please call me with any questions.   Danne Baxter, RN, MSN Rehab Admissions Coordinator [phone]

## 2022-06-14 NOTE — Progress Notes (Signed)
   Heart Failure Stewardship Pharmacist Progress Note   PCP: Azzie Glatter, FNP PCP-Cardiologist: None    HPI:  67 yo M with PMH of afib, CVA, HTN, asthma, and gout.  Presented to the ED on 3/24 with bilateral leg pain and numbness of both lower legs. 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 L common femoral also has thrombus. Vascular consulted, taken to OR for urgent revascularization.   ECHO done on 3/25 and LVEF is 40%. Last ECHO from 02/2018 EF was 45-50%.  Possible CIR at discharge.  Current HF Medications: Beta Blocker: metoprolol XL 25 mg daily ACE/ARB/ARNI: lisinopril 10 mg daily  Prior to admission HF Medications: None  Pertinent Lab Values: Serum creatinine 1.06, BUN 21, Potassium 3.9, Sodium 133, Magnesium 1.8  Vital Signs: Weight: 240 lbs  Blood pressure: 140-160/90s  Heart rate: 80-100s  I/O: -1L yesterday; net +4L  Medication Assistance / Insurance Benefits Check: Does the patient have prescription insurance?  No - only has Medicare A/B  Does the patient qualify for medication assistance through manufacturers or grants?   Yes Eligible grants and/or patient assistance programs: Eliquis, Jardiance Medication assistance applications in progress: Eliquis  Medication assistance applications approved: none Approved medication assistance renewals will be completed by: Vascular  Outpatient Pharmacy:  [facility] Is the patient willing to use [place] at discharge? Yes Is the patient willing to transition their outpatient pharmacy to utilize a [facility] outpatient pharmacy?   Yes - transitioned to [facility] OP with mail order    Assessment: 1. Acute systolic CHF (LVEF [id number]). NYHA class II symptoms. - Not volume overloaded on exam, no indication for diuretics. Strict I/Os and daily weights. Keep K>4 and Mg>2. - Continue metoprolol XL 25 mg daily - Agree with increasing to lisinopril 10  mg daily - Consider adding MRA for HF - Consider SGLT2i prior to discharge - Jardiance preferred with no prescription insurance. Can qualify for patient assistance.   Plan: 1) Medication changes recommended at this time: - Add spironolactone 25 mg daily  2) Patient assistance: - No prescription insurance - Eliquis patient assistance initiated. Can qualify for Jardiance patient assistance if this is added.  - Pharmacy transitioned to [facility] OP with mail order   3)  Education  - Patient has been educated on current HF medications and potential additions to HF medication regimen - Patient verbalizes understanding that over the next few months, these medication doses may change and more medications may be added to optimize HF regimen - Patient has been educated on basic disease state pathophysiology and goals of therapy   Kerby Nora, PharmD, BCPS Heart Failure Stewardship Pharmacist Phone [phone]

## 2022-06-14 NOTE — Progress Notes (Signed)
Physical Therapy Treatment Patient Details Name: [name] [name] MRN: [id number] DOB: 1955/05/30 Today's Date: 06/14/2022   History of Present Illness Pt is a 67 yo male admitted to ER with numbness and B leg pain. Pt found to have clots in BLEs and undersent an aortic emoliectomy, B ileofemoral embolectomy, B fem/pop embolectomies, B 4 compartment fasciotomies and a R groin expanding hematoma evacuation surgery.  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    PT Comments    Pt reported that he was able to get over to the [facility] this morning and was feeling better. Pt able to bring LE to EOB requires modA for scooting hips to EoB. Once seated EoB, pt requested to use BSC again. Pt requires maxAx2 for coming to standing and modAx2 for pivoting to [facility]. After small BM pt able to stand with maxAX2 and ambulate approximately 4 feet. Pt limited by increased R LE pain with weightbearing. D/c plans remain appropriate. PT will continue to follow acutely.   Recommendations for follow up therapy are one component of a multi-disciplinary discharge planning process, led by the attending physician.  Recommendations may be updated based on patient status, additional functional criteria and insurance authorization.     Assistance Recommended at Discharge Intermittent Supervision/Assistance  Patient can return home with the following Two people to help with walking and/or transfers;Two people to help with bathing/dressing/bathroom;Assistance with cooking/housework;Direct supervision/assist for medications management;Direct supervision/assist for financial management;Assist for transportation;Help with stairs or ramp for entrance   Equipment Recommendations  Rolling walker (2 wheels);BSC/3in1    Recommendations for Other Services Rehab consult     Precautions / Restrictions Precautions Precautions: Fall Precaution Comments: HR up to 200 and BP 186/94 on first attempt up.  Watch vitals closely. Pt with groin wound vac and groin drain Required Braces or Orthoses: Other Brace Other Brace: B ace wraps to both legs     Mobility  Bed Mobility Overal bed mobility: Needs Assistance Bed Mobility: Supine to Sit     Supine to sit: Mod assist     General bed mobility comments: pt with better bed mobility today, able to walk LE off side of bed, mod A for scooting hips to EoB    Transfers Overall transfer level: Needs assistance Equipment used: Rolling walker (2 wheels) Transfers: Sit to/from Stand, Bed to chair/wheelchair/BSC Sit to Stand: Max assist, +2 physical assistance   Step pivot transfers: Mod assist, +2 physical assistance       General transfer comment: Pt with pain putting weight through RLE but was able to with time. requires increased cuing for upright posture, increased UE use to decrease LE weight bearing and decreased breath holding    Ambulation/Gait Ambulation/Gait assistance: +2 safety/equipment, Mod assist Gait Distance (Feet): 4 Feet Assistive device: Rolling walker (2 wheels) Gait Pattern/deviations: Step-to pattern, Decreased step length - right, Decreased stance time - right, Decreased weight shift to right, Antalgic Gait velocity: slowed Gait velocity interpretation: <1.31 ft/sec, indicative of household ambulator   General Gait Details: modA for steadying, close chair follow due to pain, constant cuing for upright posture for efficient use of UE to decrease weightbearing and for decreased breath holding      Balance Overall balance assessment: Needs assistance Sitting-balance support: Feet supported Sitting balance-Leahy Scale: Fair     Standing balance support: Bilateral upper extremity supported, Reliant on assistive device for balance, During functional activity Standing balance-Leahy Scale: Poor Standing balance comment: Must have outside assist  to stand                            Cognition  Arousal/Alertness: Awake/alert Behavior During Therapy: WFL for tasks assessed/performed Overall Cognitive Status: Within Functional Limits for tasks assessed                                             General Comments General comments (skin integrity, edema, etc.): HR in 160s with increased pain in LE with dependent positioning and weightbearing      Pertinent Vitals/Pain Pain Assessment Pain Assessment: 0-10 Pain Score: 10-Worst pain ever Pain Location: R groin and BLEs Pain Descriptors / Indicators: Cramping, Discomfort, Grimacing, Guarding, Sharp, Shooting, Sore Pain Intervention(s): Limited activity within patient's tolerance, Monitored during session, Repositioned     PT Goals (current goals can now be found in the care plan section) Acute Rehab PT Goals Patient Stated Goal: get back walking PT Goal Formulation: With patient Time For Goal Achievement: 06/27/22 Potential to Achieve Goals: Fair Progress towards PT goals: Progressing toward goals    Frequency    Min 1X/week      PT Plan Current plan remains appropriate    Co-evaluation PT/OT/SLP Co-Evaluation/Treatment: Yes            AM-PAC PT "6 Clicks" Mobility   Outcome Measure  Help needed turning from your back to your side while in a flat bed without using bedrails?: A Little Help needed moving from lying on your back to sitting on the side of a flat bed without using bedrails?: Total Help needed moving to and from a bed to a chair (including a wheelchair)?: Total Help needed standing up from a chair using your arms (e.g., wheelchair or bedside chair)?: Total Help needed to walk in hospital room?: Total Help needed climbing 3-5 steps with a railing? : Total 6 Click Score: 8    End of Session Equipment Utilized During Treatment: Gait belt Activity Tolerance: Patient limited by pain Patient left: in chair;with call bell/phone within reach;with nursing/sitter in room Nurse  Communication: Mobility status PT Visit Diagnosis: Unsteadiness on feet (R26.81);Other abnormalities of gait and mobility (R26.89);Muscle weakness (generalized) (M62.81);Difficulty in walking, not elsewhere classified (R26.2);Pain Pain - Right/Left:  (bilateral) Pain - part of body: Leg     Time: [id number] PT Time Calculation (min) (ACUTE ONLY): 37 min  Charges:  $Gait Training: 8-22 mins $Therapeutic Activity: 8-22 mins                     Somaly Marteney B. Migdalia Dk PT, DPT Acute Rehabilitation Services Please use secure chat or  Call Office [phone]    [place] 06/14/2022, 12:45 PM

## 2022-06-14 NOTE — Progress Notes (Addendum)
ANTICOAGULATION CONSULT NOTE-Follow Up  Pharmacy Consult for heparin Indication:  Aortic thrombus  Allergies  Allergen Reactions   Cantaloupe (Diagnostic) Other (See Comments)    Patient states it causes his throat to itch   Carrot [Daucus Carota] Other (See Comments)    Patient states it causes his throat to itch   Codeine Nausea And Vomiting    Patient Measurements: Height: 6\' 4"  (193 cm) Weight: 108.6 kg (239 lb 6.7 oz) IBW/kg (Calculated) : 86.8 Heparin Dosing Weight: 108.6kg  Vital Signs: Temp: 98.1 F (36.7 C) (03/28 1706) Temp Source: Oral (03/28 1706) BP: 144/85 (03/28 1706) Pulse Rate: 82 (03/28 1706)  Labs: Recent Labs    06/12/22 0455 06/12/22 0647 06/12/22 1904 06/13/22 0031 06/13/22 1058 06/13/22 2211 06/14/22 0549 06/14/22 1634  HGB  --    < > 10.7*  --  10.4* 10.2* 10.3*  --   HCT  --    < > 31.0*  --  30.9* 28.9* 30.8*  --   PLT  --    < > 101*  --  108* 107* 103*  --   HEPARINUNFRC <0.10*  --   --   --   --  0.67 0.79* 0.62  CREATININE 0.98  --  1.19 1.06  --   --   --   --    < > = values in this interval not displayed.     Estimated Creatinine Clearance: 91.3 mL/min (by C-G formula based on SCr of 1.06 mg/dL).   Medical History: Past Medical History:  Diagnosis Date   A-fib ([place])    Acute ischemic stroke ([place])    Asthma    Gout    Hypertension    MRSA (methicillin resistant staph aureus) culture positive 2009    Medications:  Medications Prior to Admission  Medication Sig Dispense Refill Last Dose   aspirin 325 MG tablet Take 325 mg by mouth daily as needed for mild pain or headache.   Past Week   ibuprofen (ADVIL) 200 MG tablet Take 600-800 mg by mouth daily as needed for mild pain or headache.   Past Week   Multiple Vitamin (MULTIVITAMIN) tablet Take 1 tablet by mouth daily.   Past Week   albuterol (PROVENTIL HFA;VENTOLIN HFA) 108 (90 Base) MCG/ACT inhaler Inhale 2 puffs into the lungs every 6 (six) hours as needed for wheezing  or shortness of breath. (Patient not taking: Reported on 06/12/2022) 1 Inhaler 12 Not Taking   colchicine 0.6 MG tablet Take 1 tablet (0.6 mg total) by mouth daily. (Patient not taking: Reported on 06/12/2022) 30 tablet 2 Not Taking   cyclobenzaprine (FLEXERIL) 10 MG tablet Take 1 tablet (10 mg total) by mouth 3 (three) times daily as needed for muscle spasms. (Patient not taking: Reported on 06/12/2022) 30 tablet 2 Not Taking   lisinopril (PRINIVIL,ZESTRIL) 10 MG tablet Take 1 tablet (10 mg total) by mouth daily. (Patient not taking: Reported on 06/12/2022) 90 tablet 1 Not Taking   metoprolol succinate (TOPROL-XL) 25 MG 24 hr tablet Take 1 tablet (25 mg total) by mouth daily. (Patient not taking: Reported on 06/12/2022) 30 tablet 2 Not Taking   rivaroxaban (XARELTO) 20 MG TABS tablet Take 1 tablet (20 mg total) by mouth daily with supper. (Patient not taking: Reported on 06/12/2022) 30 tablet 2 Not Taking   Scheduled:   aspirin EC  81 mg Oral Daily   atorvastatin  40 mg Oral Daily   Chlorhexidine Gluconate Cloth  6 each Topical Daily  docusate sodium  100 mg Oral Daily   folic acid  1 mg Oral Daily   lisinopril  10 mg Oral Daily   metoprolol succinate  25 mg Oral Daily   multivitamin with minerals  1 tablet Oral Daily   pantoprazole  40 mg Oral Daily   spironolactone  25 mg Oral Daily   thiamine  100 mg Oral Daily   Assessment: 41 yoM with PMH of afib, noncompliant with AC, remote hx CVA, tobacco + marijuana, EtOH who was 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 He underwent aortic, bilateral iliofemoral, femoral-popliteal embolectomy and post surgery had right groin expanding hematoma- drain was placed and anticoag was restarted. Patient had heparin held again due to platelet drop. Platelets have since increased and pharmacy consulted to dose heparin, no bolus given bleed history.   Heparin level resulted therapeutic at 0.62  after rate decrease this morning. Hg low stable 10.3, plt low stable 103 today. No bleeding or issues with infusion per discussion with RN.  Goal of Therapy:  Heparin level 0.3-0.7 units/ml Monitor platelets by anticoagulation protocol: Yes   Plan:  Reduce heparin infusion to 1150 units/hr to target lower end of goal range with bleed hx Confirmatory heparin level with AM labs Monitor daily heparin level and CBC, s/sx bleeding   Arturo Morton, PharmD, BCPS Please check AMION for all [place] contact numbers Clinical Pharmacist 06/14/2022 5:31 PM

## 2022-06-15 ENCOUNTER — Other Ambulatory Visit (HOSPITAL_COMMUNITY): Payer: Self-pay

## 2022-06-15 ENCOUNTER — Telehealth (HOSPITAL_COMMUNITY): Payer: Self-pay

## 2022-06-15 LAB — BASIC METABOLIC PANEL
Anion gap: 6 (ref 5–15)
BUN: 25 mg/dL — ABNORMAL HIGH (ref 8–23)
CO2: 24 mmol/L (ref 22–32)
Calcium: 8.4 mg/dL — ABNORMAL LOW (ref 8.9–10.3)
Chloride: 105 mmol/L (ref 98–111)
Creatinine, Ser: 1.05 mg/dL (ref 0.61–1.24)
GFR, Estimated: >60 mL/min (ref >60)
Glucose, Bld: 105 mg/dL — ABNORMAL HIGH (ref 70–99)
Potassium: 4.2 mmol/L (ref 3.5–5.1)
Sodium: 135 mmol/L (ref 135–145)

## 2022-06-15 LAB — CBC
HCT: 28.4 % — ABNORMAL LOW (ref 39.0–52.0)
Hemoglobin: 9.5 g/dL — ABNORMAL LOW (ref 13.0–17.0)
MCH: 31.5 pg (ref 26.0–34.0)
MCHC: 33.5 g/dL (ref 30.0–36.0)
MCV: 94 fL (ref 80.0–100.0)
Platelets: 101 10*3/uL — ABNORMAL LOW (ref 150–400)
RBC: 3.02 MIL/uL — ABNORMAL LOW (ref 4.22–5.81)
RDW: 21.7 % — ABNORMAL HIGH (ref 11.5–15.5)
WBC: 10.7 10*3/uL — ABNORMAL HIGH (ref 4.0–10.5)
nRBC: 0.2 % (ref 0.0–0.2)

## 2022-06-15 LAB — HEPARIN LEVEL (UNFRACTIONATED): Heparin Unfractionated: 0.5 IU/mL (ref 0.30–0.70)

## 2022-06-15 NOTE — Progress Notes (Signed)
ANTICOAGULATION CONSULT NOTE-Follow Up  Pharmacy Consult for heparin Indication:  Aortic thrombus  Allergies  Allergen Reactions   Cantaloupe (Diagnostic) Other (See Comments)    Patient states it causes his throat to itch   Carrot [Daucus Carota] Other (See Comments)    Patient states it causes his throat to itch   Codeine Nausea And Vomiting    Patient Measurements: Height: 6\' 4"  (193 cm) Weight: 106.5 kg (234 lb 12.6 oz) IBW/kg (Calculated) : 86.8 Heparin Dosing Weight: 108.6kg  Vital Signs: Temp: 97.9 F (36.6 C) (03/29 0335) Temp Source: Oral (03/29 0335) BP: 149/91 (03/29 0335) Pulse Rate: 78 (03/29 0335)  Labs: Recent Labs    06/12/22 1904 06/13/22 0031 06/13/22 1058 06/13/22 2211 06/14/22 0549 06/14/22 1634 06/15/22 0055  HGB 10.7*  --    < > 10.2* 10.3*  --  9.5*  HCT 31.0*  --    < > 28.9* 30.8*  --  28.4*  PLT 101*  --    < > 107* 103*  --  101*  HEPARINUNFRC  --   --    < > 0.67 0.79* 0.62 0.50  CREATININE 1.19 1.06  --   --   --   --  1.05   < > = values in this interval not displayed.     Estimated Creatinine Clearance: 91.4 mL/min (by C-G formula based on SCr of 1.05 mg/dL).   Medical History: Past Medical History:  Diagnosis Date   A-fib ([place])    Acute ischemic stroke ([place])    Asthma    Gout    Hypertension    MRSA (methicillin resistant staph aureus) culture positive 2009    Medications:  Medications Prior to Admission  Medication Sig Dispense Refill Last Dose   aspirin 325 MG tablet Take 325 mg by mouth daily as needed for mild pain or headache.   Past Week   ibuprofen (ADVIL) 200 MG tablet Take 600-800 mg by mouth daily as needed for mild pain or headache.   Past Week   Multiple Vitamin (MULTIVITAMIN) tablet Take 1 tablet by mouth daily.   Past Week   albuterol (PROVENTIL HFA;VENTOLIN HFA) 108 (90 Base) MCG/ACT inhaler Inhale 2 puffs into the lungs every 6 (six) hours as needed for wheezing or shortness of breath. (Patient not  taking: Reported on 06/12/2022) 1 Inhaler 12 Not Taking   colchicine 0.6 MG tablet Take 1 tablet (0.6 mg total) by mouth daily. (Patient not taking: Reported on 06/12/2022) 30 tablet 2 Not Taking   cyclobenzaprine (FLEXERIL) 10 MG tablet Take 1 tablet (10 mg total) by mouth 3 (three) times daily as needed for muscle spasms. (Patient not taking: Reported on 06/12/2022) 30 tablet 2 Not Taking   lisinopril (PRINIVIL,ZESTRIL) 10 MG tablet Take 1 tablet (10 mg total) by mouth daily. (Patient not taking: Reported on 06/12/2022) 90 tablet 1 Not Taking   metoprolol succinate (TOPROL-XL) 25 MG 24 hr tablet Take 1 tablet (25 mg total) by mouth daily. (Patient not taking: Reported on 06/12/2022) 30 tablet 2 Not Taking   rivaroxaban (XARELTO) 20 MG TABS tablet Take 1 tablet (20 mg total) by mouth daily with supper. (Patient not taking: Reported on 06/12/2022) 30 tablet 2 Not Taking   Scheduled:   aspirin EC  81 mg Oral Daily   atorvastatin  40 mg Oral Daily   Chlorhexidine Gluconate Cloth  6 each Topical Daily   docusate sodium  100 mg Oral Daily   folic acid  1 mg Oral  Daily   lisinopril  10 mg Oral Daily   metoprolol succinate  25 mg Oral Daily   multivitamin with minerals  1 tablet Oral Daily   pantoprazole  40 mg Oral Daily   spironolactone  25 mg Oral Daily   thiamine  100 mg Oral Daily   Assessment: 57 yoM with PMH of afib, noncompliant with AC, remote hx CVA, tobacco + marijuana, EtOH who was 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 He underwent aortic, bilateral iliofemoral, femoral-popliteal embolectomy and post surgery had right groin expanding hematoma- drain was placed and anticoag was restarted. Patient had heparin held again due to platelet drop. Platelets have since increased and pharmacy consulted to dose heparin, no bolus given bleed history.   Heparin level today is subtherapeutic at 0.27, on 1200 units/hr. Hgb 10.5, plt  92--low, stable and pharmacy will continue to monitor. No line issues or signs/symptoms of bleeding per nursing. Patient was previously at higher end of therapeutic range (heparin level 0.62) on 1250 units/hr. Will increase to this rate again due to continued subtherapeutic heparin level. Nursing instructed to notify pharmacy of any s/sx of bleeding.    Goal of Therapy:  Heparin level 0.3-0.7 units/ml   >will aim for 0.3-0.5 given bleed hx  Monitor platelets by anticoagulation protocol: Yes   Plan:  - Slightly increase heparin gtt to 1250 units/hr to target lower end of goal range with bleed hx - F/u 6hr heparin level  - Monitor heparin level, CBC, s/sx bleeding daily    Billey Gosling, PharmD PGY1 Pharmacy Resident 3/30/202410:11 AM

## 2022-06-15 NOTE — Care Management Important Message (Signed)
Important Message  Patient Details  Name: [name] [name] MRN: [id number] Date of Birth: April 16, 1955   Medicare Important Message Given:  Yes     [name] 06/15/2022, 9:40 AM

## 2022-06-15 NOTE — Progress Notes (Signed)
   Heart Failure Stewardship Pharmacist Progress Note   PCP: Azzie Glatter, FNP PCP-Cardiologist: None    HPI:  67 yo M with PMH of afib, CVA, HTN, asthma, and gout.   Presented to the ED on 3/24 with bilateral leg pain and numbness of both lower legs. 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 L common femoral also has thrombus. Vascular consulted, taken to OR for urgent revascularization.    ECHO done on 3/25 and LVEF is 40%. Last ECHO from 02/2018 EF was 45-50%.  Current HF Medications: Beta Blocker: metoprolol succinate 25 mg once daily ACE/ARB/ARNI: lisinopril 10 mg once daily MRA: spironolactone  25 mg once daily   Prior to admission HF Medications: Beta blocker: metoprolol succinate 25 mg once daily ACE/ARB/ARNI: lisinopril 10 mg once daily  Pertinent Lab Values: Serum creatinine 1.05, BUN 25, Potassium 4.2, Sodium 135  Vital Signs: Weight: 234 lbs (admission weight: 240 lbs) Blood pressure: 130-160/80-100s Heart rate: 70-90s  I/O: -0.16L yesterday; net +8.27L  Does the patient have prescription insurance?  No - only has Medicare A/B   Does the patient qualify for medication assistance through manufacturers or grants?   Yes Eligible grants and/or patient assistance programs: Eliquis, Jardiance Medication assistance applications in progress: Eliquis  Medication assistance applications approved: none Approved medication assistance renewals will be completed by: Vascular   Outpatient Pharmacy[facility]  [facility] Is the patient willing to use [place] at discharge? Yes Is the patient willing to transition their outpatient pharmacy to utilize a [facility] outpatient pharmacy?   Yes - transitioned to [facility] OP with mail order  Assessment: 1. . Acute systolic CHF (LVEF [id number]). NYHA class II symptoms.  - Keep K> 4 and Mg>2. Strict I/Os and daily standing weights.  - Consider SGLT2i therapy  -  Continue metoprolol succinate 25 mg once daily - Continue spironolactone 25 mg once daily - Consider switching ACEi for ARB for easier transition to [facility], if warranted down the line. Additionally, could benefit from a dose increase for better BP control.  - Not volume overloaded on exam. Diuretic therapy not warranted at this time. Could consider prn therapy at discharge   Plan: 1) Medication changes recommended at this time: - Initiate Jardiance 10 mg once daily - Discontinue lisinopril  - Initiate losartan 50 mg once daily   2) Patient assistance: - No prescription insurance - Eliquis patient assistance initiated. Can qualify for Jardiance patient assistance if this is added.  - Pharmacy transitioned to [place] with mail order   3)  Education  - Initial education provided - Full education to be completed prior to discharge  Maryan Puls, PharmD PGY-1 [facility] Pharmacy Resident

## 2022-06-15 NOTE — Telephone Encounter (Signed)
Heart Failure Patient Advocate Encounter  Medication assistance forms for Eliquis have been started. Patient has signed forms.  Provider information will need to be completed before submitting.  Forms have been attached to patient chart under 'Media' tab. Routing to appropriate office.  Clista Bernhardt, CPhT Rx Patient Advocate Phone: [phone]

## 2022-06-15 NOTE — Progress Notes (Signed)
Inpatient Rehabilitation Admissions Coordinator   I met with patient at bedside. He and his family prefer direct discharge home to home in [place] with his adult daughter. We will sign off at this time.  Danne Baxter, RN, MSN Rehab Admissions Coordinator [phone] 06/15/2022 11:53 AM

## 2022-06-15 NOTE — Progress Notes (Addendum)
  Progress Note    06/15/2022 7:56 AM 3 Days Post-Op  Subjective:  Patient states he got out of bed with therapy yesterday and did well   Vitals:   06/14/22 2340 06/15/22 0335  BP: 133/87 (!) 149/91  Pulse: 85 78  Resp: 16 17  Temp: 98.1 F (36.7 C) 97.9 F (36.6 C)  SpO2: 99% 99%   Physical Exam: Lungs:  non labored Incisions:  R groin with edema and ecchymosis, prevena with good seal; L groin c/d/I; fasciotomies healing well  Extremities:  brisk L DP and PT; brisk R PT by doppler Neurologic: A&O  CBC    Component Value Date/Time   WBC 10.7 (H) 06/15/2022 0055   RBC 3.02 (L) 06/15/2022 0055   HGB 9.5 (L) 06/15/2022 0055   HCT 28.4 (L) 06/15/2022 0055   PLT 101 (L) 06/15/2022 0055   MCV 94.0 06/15/2022 0055   MCH 31.5 06/15/2022 0055   MCHC 33.5 06/15/2022 0055   RDW 21.7 (H) 06/15/2022 0055   LYMPHSABS 3.1 06/10/2022 0005   MONOABS 0.9 06/10/2022 0005   EOSABS 0.3 06/10/2022 0005   BASOSABS 0.1 06/10/2022 0005    BMET    Component Value Date/Time   NA 135 06/15/2022 0055   K 4.2 06/15/2022 0055   CL 105 06/15/2022 0055   CO2 24 06/15/2022 0055   GLUCOSE 105 (H) 06/15/2022 0055   BUN 25 (H) 06/15/2022 0055   CREATININE 1.05 06/15/2022 0055   CALCIUM 8.4 (L) 06/15/2022 0055   GFRNONAA >60 06/15/2022 0055   GFRAA >60 02/21/2018 0641    INR    Component Value Date/Time   INR 1.3 (H) 06/10/2022 0005     Intake/Output Summary (Last 24 hours) at 06/15/2022 0756 Last data filed at 06/15/2022 0500 Gross per 24 hour  Intake 766.65 ml  Output 1165 ml  Net -398.35 ml     Assessment/Plan:  67 y.o. male is s/p   Aortic embolectomy Bilateral iliofemoral embolectomy Bilateral femoral-popliteal embolectomy Bilateral 4 compartment fasciotomy Aortogram Bilateral lower extremity angiogram And Right groin exploration Right groin hematoma evacuation Myriad more cell powder placement 15 French drain placement Prevena vacuum assisted dressing 4 Days  Post-Op And Bilateral lower extremity low leg fasciotomy closure   2 Days Post-Op 3 Days Post-Op   Feet are warm and well perfused by doppler exam Dressing changed on fasciotomies; wrapped with ABD and ACE Encouraged OOB with therapy again today White count normalizing; daily CBC Continue heparin until ready for discharge Refusing CIR currently; patient would like to discharge home when ready   Dagoberto Ligas, PA-C [facility] [phone] 06/15/2022 7:56 AM  VASCULAR STAFF ADDENDUM: I have independently interviewed and examined the patient. I agree with the above.    Cassandria Santee, MD [facility] Phone Number: [phone] 06/15/2022 3:12 PM

## 2022-06-16 ENCOUNTER — Encounter (HOSPITAL_COMMUNITY): Payer: Self-pay | Admitting: Vascular Surgery

## 2022-06-16 DIAGNOSIS — I509 Heart failure, unspecified: Secondary | ICD-10-CM

## 2022-06-16 DIAGNOSIS — Z9889 Other specified postprocedural states: Secondary | ICD-10-CM

## 2022-06-16 DIAGNOSIS — I480 Paroxysmal atrial fibrillation: Secondary | ICD-10-CM

## 2022-06-16 LAB — BASIC METABOLIC PANEL
Anion gap: 9 (ref 5–15)
BUN: 16 mg/dL (ref 8–23)
CO2: 21 mmol/L — ABNORMAL LOW (ref 22–32)
Calcium: 8.5 mg/dL — ABNORMAL LOW (ref 8.9–10.3)
Chloride: 101 mmol/L (ref 98–111)
Creatinine, Ser: 0.93 mg/dL (ref 0.61–1.24)
GFR, Estimated: >60 mL/min (ref >60)
Glucose, Bld: 99 mg/dL (ref 70–99)
Potassium: 3.7 mmol/L (ref 3.5–5.1)
Sodium: 131 mmol/L — ABNORMAL LOW (ref 135–145)

## 2022-06-16 LAB — HEPARIN LEVEL (UNFRACTIONATED)
Heparin Unfractionated: 0.28 IU/mL — ABNORMAL LOW (ref 0.30–0.70)
Heparin Unfractionated: 0.27 IU/mL — ABNORMAL LOW (ref 0.30–0.70)
Heparin Unfractionated: 0.23 IU/mL — ABNORMAL LOW (ref 0.30–0.70)

## 2022-06-16 LAB — CBC
HCT: 31.3 % — ABNORMAL LOW (ref 39.0–52.0)
Hemoglobin: 10.5 g/dL — ABNORMAL LOW (ref 13.0–17.0)
MCH: 31.5 pg (ref 26.0–34.0)
MCHC: 33.5 g/dL (ref 30.0–36.0)
MCV: 94 fL (ref 80.0–100.0)
Platelets: 92 10*3/uL — ABNORMAL LOW (ref 150–400)
RBC: 3.33 MIL/uL — ABNORMAL LOW (ref 4.22–5.81)
RDW: 21.2 % — ABNORMAL HIGH (ref 11.5–15.5)
WBC: 7 10*3/uL (ref 4.0–10.5)
nRBC: 0 % (ref 0.0–0.2)

## 2022-06-16 LAB — MAGNESIUM: Magnesium: 2 mg/dL (ref 1.7–2.4)

## 2022-06-16 MED ORDER — METOPROLOL SUCCINATE ER 25 MG PO TB24
25.0000 mg | ORAL_TABLET | Freq: Once | ORAL | Status: AC
  Administered 2022-06-16: 25 mg via ORAL
  Filled 2022-06-16: qty 1

## 2022-06-16 MED ORDER — METOPROLOL SUCCINATE ER 50 MG PO TB24
50.0000 mg | ORAL_TABLET | Freq: Two times a day (BID) | ORAL | Status: DC
  Administered 2022-06-16 – 2022-06-19 (×6): 50 mg via ORAL
  Filled 2022-06-16 (×6): qty 1

## 2022-06-16 MED ORDER — SACUBITRIL-VALSARTAN 24-26 MG PO TABS
1.0000 | ORAL_TABLET | Freq: Two times a day (BID) | ORAL | Status: DC
  Administered 2022-06-17 – 2022-06-19 (×4): 1 via ORAL
  Filled 2022-06-16 (×4): qty 1

## 2022-06-16 NOTE — Progress Notes (Signed)
ANTICOAGULATION CONSULT NOTE - Follow Up Consult  Pharmacy Consult for heparin Indication:  aortic thrombus  Labs: Recent Labs    06/14/22 0549 06/14/22 1634 06/15/22 0055 06/16/22 0103  HGB 10.3*  --  9.5* 10.5*  HCT 30.8*  --  28.4* 31.3*  PLT 103*  --  101* 92*  HEPARINUNFRC 0.79* 0.62 0.50 0.28*  CREATININE  --   --  1.05 0.93    Assessment: 66yo male slightly subtherapeutic on heparin after two levels at goal; no infusion issues or signs of bleeding per RN.  Goal of Therapy:  Heparin level 0.3-0.7 units/ml   Plan:  Will increase heparin infusion slightly to 1200 units/hr and check level in 6 hours.    Wynona Neat, PharmD, BCPS  06/16/2022,3:17 AM

## 2022-06-16 NOTE — Consult Note (Addendum)
Cardiology Consultation   Patient ID: SAFAREE BERTE MRN: [id number]; DOB: 11-24-1955  Admit date: 06/09/2022 Date of Consult: 06/16/2022  PCP:  Azzie Glatter, [place] Providers Cardiologist:  None        Patient Profile:   [name] [name] is a 67 y.o. male with a hx  of  PAF, CVA, asthma, HTN, gout and tobacco use who is being seen 06/16/2022 for the evaluation of atrial fib at the request of Dr Virl Cagey.  History of Present Illness:   Mr. Grelle with hx of atrial fib dating back to 2019 when he was admitted for CVA and found to be in a fib.  He was placed on toprol 25 daily and xarelto. ( Echo in 2019 with EF 45-50%, no RWMA, mild MR, RA & LA mild to mod dilated)  It seems his medications were too expensive so he stopped and in 03/2018 was only on ASA- assistance was provided for xarelto at that time but no further follow up.   Pt was only on ASA 325 mg daily at home.   Pt admitted 06/10/22 with rt lower ext pain and weakness.   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 The left common femoral also has thrombus. CT with heterogeneous appearance of the nephorgrams may indicate renal infarcts, + hepatic cirrhosis.  On 06/10/22 he underwent aortic embolectomy, bilateral iliofemoral embolectomy, bilateral fem-pop embolectomy, bilateral 4 compartment fasciotomy.   He was heparinized due to concern for cardioembolic etiology.  Later he developed expanding hematoma in Rt groin and was taken to the OR  he had Rt groin hematoma evacuation and myriad cell powder placement.    Anticoagulation held at that time.   On the 26th he went to OR for fasciotomy closure.  He was placed on IV heparin on the 27th without bolus.     Echo this admit with EF of 40%, technically difficult study.  RV is normal.  Valves normal.  Today HR up to 200s in AF.    Na 131, K+ 3.7 BUN 16 Cr 0.93 Mg+ 2.0  Hgb 10.5 WBC 7 down from 24.2 at  peak.   Plts 92  ( plts 99 in 2009)    BP 151/84 P 136 T 99.7 and Resp 20   Prior to the admit he slept on 1 pillow at home.  Occ chest pain some that felt in chest would last 10 min or so and then another outside the chest that was fleeting.  No associated symptoms.  Both parents with heart disease in their 44s-   Past Medical History:  Diagnosis Date   A-fib ([place])    Acute ischemic stroke ([place])    Asthma    Gout    Hypertension    MRSA (methicillin resistant staph aureus) culture positive 2009    Past Surgical History:  Procedure Laterality Date   AORTOGRAM N/A 06/10/2022   Procedure: AORTOGRAM;  Surgeon: Broadus John, MD;  Location: [facility] OR;  Service: Vascular;  Laterality: N/A;   APPLICATION OF WOUND VAC Right 06/10/2022   Procedure: APPLICATION OF WOUND VAC WITH MYRIAD MORCELLS;  Surgeon: Broadus John, MD;  Location: [facility] OR;  Service: Vascular;  Laterality: Right;   EMBOLECTOMY  06/10/2022   Procedure: SUPERFICIAL FEMORAL ARTERY-POPITEAL EMBOLECTOMY;  Surgeon: Broadus John, MD;  Location: [facility] OR;  Service: Vascular;;   FASCIOTOMY Bilateral 06/10/2022   Procedure: BILATERAL FOUR COMPARTMENT FASCIOTOMY;  Surgeon: Broadus John, MD;  Location: [place];  Service: Vascular;  Laterality: Bilateral;   FASCIOTOMY CLOSURE Bilateral 06/12/2022   Procedure: BILATERAL LOWER EXTREMITY FASCIOTOMY CLOSURE;  Surgeon: Broadus John, MD;  Location: [place];  Service: Vascular;  Laterality: Bilateral;   HEMATOMA EVACUATION Right 06/10/2022   Procedure: EVACUATION LEFT GROIN  HEMATOMA;  Surgeon: Broadus John, MD;  Location: [place];  Service: Vascular;  Laterality: Right;   LOWER EXTREMITY ANGIOGRAM Bilateral 06/10/2022   Procedure: BILATERAL LOWER EXTREMITY ANGIOGRAM;  Surgeon: Broadus John, MD;  Location: [place];  Service: Vascular;  Laterality: Bilateral;   THROMBECTOMY FEMORAL ARTERY Bilateral 06/10/2022   Procedure: THROMBECTOMY BILATERAL AOTRA-ILIO-FEMORAL ARTERY;  Surgeon: Broadus John, MD;  Location[facility] OR;  Service: Vascular;  Laterality: Bilateral;     Home Medications:   was only on ASA  Prior to Admission medications   Medication Sig Start Date End Date Taking? Authorizing Provider  aspirin 325 MG tablet Take 325 mg by mouth daily as needed for mild pain or headache.   Yes [provider]  ibuprofen (ADVIL) 200 MG tablet Take 600-800 mg by mouth daily as needed for mild pain or headache.   Yes [provider]  Multiple Vitamin (MULTIVITAMIN) tablet Take 1 tablet by mouth daily.   Yes [provider]  albuterol (PROVENTIL HFA;VENTOLIN HFA) 108 (90 Base) MCG/ACT inhaler Inhale 2 puffs into the lungs every 6 (six) hours as needed for wheezing or shortness of breath. Patient not taking: Reported on 06/12/2022 04/07/18   Azzie Glatter, FNP  colchicine 0.6 MG tablet Take 1 tablet (0.6 mg total) by mouth daily. Patient not taking: Reported on 06/12/2022 04/07/18   Azzie Glatter, FNP  cyclobenzaprine (FLEXERIL) 10 MG tablet Take 1 tablet (10 mg total) by mouth 3 (three) times daily as needed for muscle spasms. Patient not taking: Reported on 06/12/2022 04/07/18   Azzie Glatter, FNP  lisinopril (PRINIVIL,ZESTRIL) 10 MG tablet Take 1 tablet (10 mg total) by mouth daily. Patient not taking: Reported on 06/12/2022 04/07/18   Azzie Glatter, FNP  metoprolol succinate (TOPROL-XL) 25 MG 24 hr tablet Take 1 tablet (25 mg total) by mouth daily. Patient not taking: Reported on 06/12/2022 04/07/18   Azzie Glatter, FNP  rivaroxaban (XARELTO) 20 MG TABS tablet Take 1 tablet (20 mg total) by mouth daily with supper. Patient not taking: Reported on 06/12/2022 04/07/18   Azzie Glatter, FNP    Inpatient Medications: Scheduled Meds:  aspirin EC  81 mg Oral Daily   atorvastatin  40 mg Oral Daily   Chlorhexidine Gluconate Cloth  6 each Topical Daily   docusate sodium  100 mg Oral Daily   folic acid  1 mg Oral Daily   lisinopril  10 mg Oral  Daily   metoprolol succinate  25 mg Oral Daily   multivitamin with minerals  1 tablet Oral Daily   pantoprazole  40 mg Oral Daily   spironolactone  25 mg Oral Daily   thiamine  100 mg Oral Daily   Continuous Infusions:  sodium chloride     heparin 1,200 Units/hr (06/16/22 0325)   magnesium sulfate bolus IVPB     PRN Meds: sodium chloride, acetaminophen **OR** acetaminophen, alum & mag hydroxide-simeth, bisacodyl, cyclobenzaprine, guaiFENesin-dextromethorphan, hydrALAZINE, HYDROmorphone (DILAUDID) injection, labetalol, magnesium sulfate bolus IVPB, midazolam, ondansetron, oxyCODONE, phenol, potassium chloride, senna-docusate, sodium phosphate  Allergies:    Allergies  Allergen Reactions   Cantaloupe (Diagnostic) Other (See  Comments)    Patient states it causes his throat to itch   Carrot [Daucus Carota] Other (See Comments)    Patient states it causes his throat to itch   Codeine Nausea And Vomiting    Social History:   Social History   Socioeconomic History   Marital status: Married    Spouse name: Not on file   Number of children: 3   Years of education: Not on file   Highest education level: 12th grade  Occupational History   Occupation: Retired  Tobacco Use   Smoking status: Some Days    Packs/day: 0.50    Years: 53.00    Additional pack years: 0.00    Total pack years: 26.50    Types: Cigarettes   Smokeless tobacco: Never  Vaping Use   Vaping Use: Never used  Substance and Sexual Activity   Alcohol use: Yes    Comment: 2-3 times per week   Drug use: Not Currently   Sexual activity: Not on file  Other Topics Concern   Not on file  Social History Narrative   Not on file   Social Determinants of Health   Financial Resource Strain: Low Risk  (06/13/2022)   Overall Financial Resource Strain (CARDIA)    Difficulty of Paying Living Expenses: Not very hard  Food Insecurity: No Food Insecurity (06/13/2022)   Hunger Vital Sign    Worried About Running Out of  Food in the Last Year: Never true    Ran Out of Food in the Last Year: Never true  Transportation Needs: No Transportation Needs (06/13/2022)   PRAPARE - Hydrologist (Medical): No    Lack of Transportation (Non-Medical): No  Physical Activity: Not on file  Stress: Not on file  Social Connections: Not on file  Intimate Partner Violence: Not At Risk (06/15/2022)   Humiliation, Afraid, Rape, and Kick questionnaire    Fear of Current or Ex-Partner: No    Emotionally Abused: No    Physically Abused: No    Sexually Abused: No    Family History:    Family History  Problem Relation Age of Onset   Heart disease Mother    Hypertension Father      ROS:  Please see the history of present illness.  General:no colds or fevers, no weight changes Skin:no rashes or ulcers HEENT:no blurred vision, no congestion CV:see HPI PUL:see HPI GI:no diarrhea constipation or melena, no indigestion GU:no hematuria, no dysuria MS:no joint pain, ++++ claudication Neuro:no syncope, no lightheadedness Endo:no diabetes, no thyroid disease  All other ROS reviewed and negative.     Physical Exam/Data:   Vitals:   06/15/22 2344 06/16/22 0334 06/16/22 0809 06/16/22 0824  BP: (!) 158/93 (!) 157/118 (!) 151/84   Pulse: (!) 114 (!) 57 (!) 116 (!) 205  Resp: 17 20    Temp: 98.2 F (36.8 C) 99.1 F (37.3 C) 99.7 F (37.6 C)   TempSrc: Oral Oral Axillary   SpO2: 100% 98% 100% 100%  Weight:      Height:        Intake/Output Summary (Last 24 hours) at 06/16/2022 1134 Last data filed at 06/16/2022 0325 Gross per 24 hour  Intake 537.57 ml  Output 650 ml  Net -112.43 ml      06/15/2022    3:35 AM 06/14/2022    2:23 PM 06/10/2022   12:03 AM  Last 3 Weights  Weight (lbs) 234 lb 12.6 oz 239 lb 6.7 oz  240 lb  Weight (kg) 106.5 kg 108.6 kg 108.863 kg     Body mass index is 28.58 kg/m.  General:  Well nourished, well developed, in no acute distress HEENT: normal- though poor  dentition Neck: no JVD Vascular: No carotid bruits; Distal pulses 2+ bilaterally Cardiac: irreg irreg; no murmur gallup rub or click Lungs:  clear to auscultation bilaterally, no wheezing, rhonchi or rales  Abd: soft, nontender, no hepatomegaly  Ext: no edema, both legs wrapped Musculoskeletal:  No deformities, BUE and BLE strength normal and equal Skin: warm and dry  Neuro:  CNs 2-12 intact, no focal abnormalities noted Psych:  Normal affect   EKG:  The EKG was personally reviewed and demonstrates:  06/10/22 atrial fib with RVR at 104 and PVCs no acute ST changes, Q waves in inf leads.  Followup is similar with HR 100 Telemetry:  Telemetry was personally reviewed and demonstrates:    Relevant CV Studies: Echo 06/11/22 IMPRESSIONS     1. Technically difficult study with poor visualization of cardiac  structures. Patient in atrial fibrillation.   2. Left ventricular systolic function is mildly reduced secondary to  atrial fibrillation with an estimated EF of 40%. There is significant beat  to beat variability. Consider repeat limited study once patient in sinus  rhythm.   3. Right ventricular systolic function is normal. The right ventricular  size is normal. There is normal pulmonary artery systolic pressure.   4. The mitral valve is normal in structure. No evidence of mitral valve  regurgitation. No evidence of mitral stenosis.   5. The aortic valve is normal in structure. Aortic valve regurgitation is  not visualized. No aortic stenosis is present.   6. The inferior vena cava is normal in size with greater than 50%  respiratory variability, suggesting right atrial pressure of 3 mmHg.   FINDINGS   Left Ventricle: Left ventricular ejection fraction, by estimation, is 40  to 45%. The left ventricle has mildly decreased function. The left  ventricle has no regional wall motion abnormalities. Definity contrast  agent was given IV to delineate the left  ventricular endocardial  borders. The left ventricular internal cavity size  was normal in size. There is mild left ventricular hypertrophy. Left  ventricular diastolic function could not be evaluated due to atrial  fibrillation. Left ventricular diastolic  function could not be evaluated.   Right Ventricle: The right ventricular size is normal. No increase in  right ventricular wall thickness. Right ventricular systolic function is  normal. There is normal pulmonary artery systolic pressure. The tricuspid  regurgitant velocity is 1.78 m/s, and   with an assumed right atrial pressure of 3 mmHg, the estimated right  ventricular systolic pressure is [id number] mmHg.   Left Atrium: Left atrial size was normal in size.   Right Atrium: Right atrial size was normal in size.   Pericardium: There is no evidence of pericardial effusion.   Mitral Valve: The mitral valve is normal in structure. No evidence of  mitral valve regurgitation. No evidence of mitral valve stenosis.   Tricuspid Valve: The tricuspid valve is normal in structure. Tricuspid  valve regurgitation is not demonstrated. No evidence of tricuspid  stenosis.   Aortic Valve: The aortic valve is normal in structure. Aortic valve  regurgitation is not visualized. No aortic stenosis is present.   Pulmonic Valve: The pulmonic valve was normal in structure. Pulmonic valve  regurgitation is not visualized. No evidence of pulmonic stenosis.   Aorta: The aortic root  is normal in size and structure.   Venous: The inferior vena cava is normal in size with greater than 50%  respiratory variability, suggesting right atrial pressure of 3 mmHg.   IAS/Shunts: No atrial level shunt detected by color flow Doppler.   Echo 02/21/18  Study Conclusions   - Left ventricle: Abnormal septal motion mid and basal inferior    wall hypokinesis The cavity size was normal. Wall thickness was    normal. Systolic function was mildly reduced. The estimated    ejection fraction was  in the range of 45% to 50%. Wall motion was    normal; there were no regional wall motion abnormalities. The    study is not technically sufficient to allow evaluation of LV    diastolic function.  - Aortic valve: Sclerosis without stenosis.  - Mitral valve: Calcified annulus. Mildly thickened leaflets .    There was mild regurgitation.  - Left atrium: The atrium was mildly to moderately dilated.  - Right atrium: The atrium was mildly to moderately dilated.  - Atrial septum: No defect or patent foramen ovale was identified.  - Tricuspid valve: There was moderate regurgitation.   Laboratory Data:  High Sensitivity Troponin:  No results for input(s): "TROPONINIHS" in the last 720 hours.   Chemistry Recent Labs  Lab 06/10/22 0005 06/10/22 0011 06/13/22 0031 06/15/22 0055 06/16/22 0103  NA 137   < > 133* 135 131*  K 3.4*   < > 3.9 4.2 3.7  CL 106   < > 104 105 101  CO2 20*   < > 23 24 21*  GLUCOSE 142*   < > 168* 105* 99  BUN 15   < > 21 25* 16  CREATININE 0.99   < > 1.06 1.05 0.93  CALCIUM 9.3   < > 8.1* 8.4* 8.5*  MG 1.8  --   --   --  2.0  GFRNONAA >60   < > >60 >60 >60  ANIONGAP 11   < > 6 6 9    < > = values in this interval not displayed.    Recent Labs  Lab 06/11/22 1114 06/11/22 2056 06/12/22 1904  PROT 4.6* 4.2* 5.7*  ALBUMIN 2.2* 2.1* 2.6*  AST 133* 120* 108*  ALT 75* 65* 65*  ALKPHOS 54 55 67  BILITOT 1.0 1.9* 2.3*   Lipids  Recent Labs  Lab 06/11/22 0455  CHOL 68  TRIG 36  HDL 16*  LDLCALC 45  CHOLHDL 4.3    Hematology Recent Labs  Lab 06/14/22 0549 06/15/22 0055 06/16/22 0103  WBC 19.1* 10.7* 7.0  RBC 3.27* 3.02* 3.33*  HGB 10.3* 9.5* 10.5*  HCT 30.8* 28.4* 31.3*  MCV 94.2 94.0 94.0  MCH 31.5 31.5 31.5  MCHC 33.4 33.5 33.5  RDW 21.5* 21.7* 21.2*  PLT 103* 101* 92*   Thyroid No results for input(s): "TSH", "FREET4" in the last 168 hours.  BNPNo results for input(s): "BNP", "PROBNP" in the last 168 hours.  DDimer No results for  input(s): "DDIMER" in the last 168 hours.   Radiology/Studies:  No results found.   Assessment and Plan:   Atrial fib RVR now first diagnosed in 2019 with CVA, pt discharged on xarelto but did not continue to take and was on ASA 325 mg daily.  Was no longer on BB for rate control. Now continues in atrial fib with RVR today on toprol XL 25 - would increase the dose 50 mg XL BID and give another 25  this AM to equal 50 then begin the 50 po this pm Anticoagulation on IV heparin now, Siera Beyersdorf need at discharge. While xarelto would be easier for pt not sure he can afford, may need coumadin but Janin Kozlowski need more frequent follow ups for INR  currently on 81 mg ASA po with IV heparin  Chrystine Frogge need care manager to assist with meds.  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  Also Rt groin hematoma evacuation post op. Per Vascular.  6 days post op, 4 days post op of closure  Mirha Brucato need anticoagulation Hx CVA in 2019 CM in 2019 noted on Echo may be due to atrial fib. Now decreased EF to  40%. On zestril 10 and spironolactone 25 mg - torpol 25 daily.   Stop zestril and use entresto after ACE washout  Cheree Fowles ask pharmacy to assist Thrombocytopenia plts were 110 in 2009 and though labs spotty no normal platelets on admit 66K ? Hematology consult as well.  Cirrhosis with AST 157 on admit and ALT 114  (most recent check AST 108 and ALT 65) drinks 1-2 apple cider drinks per day at most with occ shot of bourbon.  HTN BP on arrival 146/90    Risk Assessment/Risk Scores:          CHA2DS2-VASc Score = 3   This indicates a 3.2% annual risk of stroke. The patient's score is based upon: CHF History: 0 HTN History: 0 Diabetes History: 0 Stroke History: 2 Vascular Disease History: 0 Age Score: 1 Gender Score: 0         For questions or updates, please  contact [facility] Please consult [email or web address] for contact info under    Signed, Cecilie Kicks, NP  06/16/2022 11:34 AM   I have seen and examined this patient with Cecilie Kicks.  Agree with above, note added to reflect my findings.  With a history of atrial fibrillation, CVA, asthma, hypertension presented to the hospital with leg pain and weakness, found to have aortic and right iliac system thrombus.  CT scan also showed cirrhosis.  He underwent aortic embolectomy, bilateral iliofemoral embolectomy, bilateral femoropopliteal embolectomy, bilateral [place] apartment fasciotomy.  He developed right groin hematoma requiring surgical intervention.  This morning, he went into atrial fibrillation.  He does have a history of atrial fibrillation diagnosed in 2019 when he presented to the hospital with a CVA.  He has not been taking his anticoagulation as he did not have insurance.  He currently feels well and is without major complaint.  GEN: Well nourished, well developed, in no acute distress  HEENT: normal  Neck: no JVD, carotid bruits, or masses Cardiac: irregular; no murmurs, rubs, or gallops,no edema  Respiratory:  clear to auscultation bilaterally, normal work of breathing GI: soft, nontender, nondistended, + BS MS: no deformity or atrophy  Skin: warm and dry Neuro:  Strength and sensation are intact Psych: euthymic mood, full affect   Paroxysmal atrial fibrillation: Currently on heparin.  Nakeshia Waldeck transition to Xarelto 20 mg.  He is not well rate controlled.  Would increase Toprol-XL to 50 mg twice daily.  He was also have systolic heart failure, goal heart rate 90s while in atrial fibrillation. Acute heart failure: Ejection fraction 45 to 50%.  Blood pressure is elevated today.  Increasing metoprolol.  Vaneta Hammontree stop lisinopril and Honor Fairbank likely start Entresto as an outpatient.  Would continue Aldactone. Aortic embolism: Postsurgery as above.  Plan per vascular surgery.  Lutricia Widjaja M. Khaliq Turay  MD 06/16/2022 12:37 PM

## 2022-06-16 NOTE — Progress Notes (Signed)
Pt up to BR with PT/OT HR in 200s.  At rest he's been 100-110s in AF with fluctuations up to 150s with minor activity such as repositioning in bed.  Asymptomatic otherwise and other VS stable. Scheduled po metoprolol 25 mg administered.  Primary team notified and looking into cards consult.

## 2022-06-16 NOTE — Progress Notes (Signed)
Physical Therapy Treatment Patient Details Name: [name] [name] MRN: [id number] DOB: [birth year] Today's Date: 06/16/2022   History of Present Illness 67 yo male admitted 3/23 with numbness and B leg pain. Pt found to have clots in BLEs s/p aortic emoliectomy, B ileofemoral embolectomy, B fem/pop embolectomies, B 4 compartment fasciotomies and a Rt groin expanding hematoma evacuation surgery.  PMH: CVA; noncompliance with blood thinners post CVA.    PT Comments    Pt pleasant with excellent progression and able to walk to bathroom with RW. Pt able to move bil LE onto and off of bed and perform HEP. Pt mobility limited by tachycardia HR 205 max with limited activity but asymptomatic and no change in sitting EOB vs limited gait. HR 130 at rest end of session. RN aware of all above and cardiology notified. Will continue to progress activity as pt able to tolerate with D/c plan updated to HHPT.     Recommendations for follow up therapy are one component of a multi-disciplinary discharge planning process, led by the attending physician.  Recommendations may be updated based on patient status, additional functional criteria and insurance authorization.  Follow Up Recommendations       Assistance Recommended at Discharge Intermittent Supervision/Assistance  Patient can return home with the following A little help with walking and/or transfers;A little help with bathing/dressing/bathroom;Assist for transportation;Assistance with cooking/housework   Equipment Recommendations  Rolling walker (2 wheels);BSC/3in1    Recommendations for Other Services       Precautions / Restrictions Precautions Precautions: Fall Precaution Comments: VAC Rt groin, watch HR tachy with all mobility      Mobility  Bed Mobility Overal bed mobility: Needs Assistance Bed Mobility: Supine to Sit, Sit to Supine     Supine to sit: Min guard Sit to supine: Supervision   General bed mobility comments: HOB 20  degrees with limited use of single rail, able to return to bed without physical assist and supervision for lines. pt denied OOB to chair    Transfers Overall transfer level: Needs assistance   Transfers: Sit to/from Stand Sit to Stand: Min assist           General transfer comment: cues for hand placement with slight elevation of bed, min assist to rise    Ambulation/Gait Ambulation/Gait assistance: Min assist Gait Distance (Feet): 18 Feet Assistive device: Rolling walker (2 wheels) Gait Pattern/deviations: Step-through pattern, Decreased stride length, Wide base of support   Gait velocity interpretation: <1.8 ft/sec, indicate of risk for recurrent falls   General Gait Details: increased BOS, flexed trunk, assist for lines and safety. HR max 205 with pt Tachy and Afib no chest pain or SOB pt walked 18' x 2 trials with seated rest at toilet   Stairs             Wheelchair Mobility    Modified Rankin (Stroke Patients Only)       Balance Overall balance assessment: Needs assistance   Sitting balance-Leahy Scale: Good     Standing balance support: Bilateral upper extremity supported, Reliant on assistive device for balance, During functional activity Standing balance-Leahy Scale: Poor Standing balance comment: RW for standing                            Cognition Arousal/Alertness: Awake/alert Behavior During Therapy: WFL for tasks assessed/performed Overall Cognitive Status: Within Functional Limits for tasks assessed  Exercises General Exercises - Lower Extremity Heel Slides: AROM, Both, Supine, 10 reps Straight Leg Raises: AROM, Both, Supine, 10 reps    General Comments        Pertinent Vitals/Pain Pain Assessment Pain Score: 6  Pain Location: hips Pain Descriptors / Indicators: Burning, Aching Pain Intervention(s): Limited activity within patient's tolerance, Monitored during  session, Repositioned    Home Living                          Prior Function            PT Goals (current goals can now be found in the care plan section) Progress towards PT goals: Progressing toward goals    Frequency    Min 1X/week      PT Plan Discharge plan needs to be updated    Co-evaluation     PT goals addressed during session: Mobility/safety with mobility OT goals addressed during session: ADL's and self-care      AM-PAC PT "6 Clicks" Mobility   Outcome Measure  Help needed turning from your back to your side while in a flat bed without using bedrails?: A Little Help needed moving from lying on your back to sitting on the side of a flat bed without using bedrails?: A Little Help needed moving to and from a bed to a chair (including a wheelchair)?: A Little Help needed standing up from a chair using your arms (e.g., wheelchair or bedside chair)?: A Little Help needed to walk in hospital room?: A Little Help needed climbing 3-5 steps with a railing? : A Lot 6 Click Score: 17    End of Session Equipment Utilized During Treatment: Gait belt Activity Tolerance: Patient tolerated treatment well Patient left: in bed;with call bell/phone within reach Nurse Communication: Mobility status PT Visit Diagnosis: Unsteadiness on feet (R26.81);Other abnormalities of gait and mobility (R26.89);Muscle weakness (generalized) (M62.81);Difficulty in walking, not elsewhere classified (R26.2);Pain     Time: [id number] PT Time Calculation (min) (ACUTE ONLY): 33 min  Charges:  $Therapeutic Activity: 8-22 mins                     Bayard Males, PT Acute Rehabilitation Services Office: [phone]    Lamarr Lulas 06/16/2022, 8:52 AM

## 2022-06-16 NOTE — Progress Notes (Addendum)
  Progress Note    06/16/2022 10:00 AM 4 Days Post-Op  Subjective: Concerned about his heart rate with minimal activity; legs continue to feel better   Vitals:   06/16/22 0809 06/16/22 0824  BP: (!) 151/84   Pulse: (!) 116 (!) 205  Resp:    Temp: 99.7 F (37.6 C)   SpO2: 100% 100%   Physical Exam: Cardiac: Tachycardic with irregular rhythm Lungs: Nonlabored Incisions: Groin incisions are well-appearing; ecchymosis on the right however soft; dressing left in place over fasciotomies Extremities: Feet are warm and well-perfused with brisk pedal signals by Doppler Neurologic: A&O  CBC    Component Value Date/Time   WBC 7.0 06/16/2022 0103   RBC 3.33 (L) 06/16/2022 0103   HGB 10.5 (L) 06/16/2022 0103   HCT 31.3 (L) 06/16/2022 0103   PLT 92 (L) 06/16/2022 0103   MCV 94.0 06/16/2022 0103   MCH 31.5 06/16/2022 0103   MCHC 33.5 06/16/2022 0103   RDW 21.2 (H) 06/16/2022 0103   LYMPHSABS 3.1 06/10/2022 0005   MONOABS 0.9 06/10/2022 0005   EOSABS 0.3 06/10/2022 0005   BASOSABS 0.1 06/10/2022 0005    BMET    Component Value Date/Time   NA 131 (L) 06/16/2022 0103   K 3.7 06/16/2022 0103   CL 101 06/16/2022 0103   CO2 21 (L) 06/16/2022 0103   GLUCOSE 99 06/16/2022 0103   BUN 16 06/16/2022 0103   CREATININE 0.93 06/16/2022 0103   CALCIUM 8.5 (L) 06/16/2022 0103   GFRNONAA >60 06/16/2022 0103   GFRAA >60 02/21/2018 0641    INR    Component Value Date/Time   INR 1.3 (H) 06/10/2022 0005     Intake/Output Summary (Last 24 hours) at 06/16/2022 1000 Last data filed at 06/16/2022 0325 Gross per 24 hour  Intake 537.57 ml  Output 1000 ml  Net -462.43 ml     Assessment/Plan:  67 y.o. male is s/p  Aortic embolectomy Bilateral iliofemoral embolectomy Bilateral femoral-popliteal embolectomy Bilateral 4 compartment fasciotomy Aortogram Bilateral lower extremity angiogram And Right groin exploration Right groin hematoma evacuation Myriad more cell powder  placement 15 French drain placement Prevena vacuum assisted dressing 4 Days Post-Op And Bilateral lower extremity low leg fasciotomy closure  4 Days Post-Op    Bilateral lower extremities warm and well-perfused by Doppler exam Daily dressing changes to fasciotomy incisions with ABD and Ace wrap Patient in atrial fibrillation with heart rate elevated over 200 this morning; EKG ordered; cardiology consulted for management of atrial fibrillation; continue heparin   Dagoberto Ligas, PA-C [facility] [phone] 06/16/2022 10:00 AM  I agree with the above.  I have seen and evaluated the patient.  I spoke with Dr. Curt Bears who is helping with his heart rate issues.  He will need to remain in the hospital until his heart rate is under better control.  While we are awaiting this, we will continue IV heparin with plans for transition to a DOAC and aspirin at discharge.  Annamarie Major

## 2022-06-16 NOTE — Progress Notes (Signed)
Occupational Therapy Treatment Patient Details Name: [name] [name] MRN: [id number] DOB: [birth year] Today's Date: 06/16/2022   History of present illness 67 yo male admitted 3/23 with numbness and B leg pain. Pt found to have clots in BLEs s/p aortic emoliectomy, B ileofemoral embolectomy, B fem/pop embolectomies, B 4 compartment fasciotomies and a Rt groin expanding hematoma evacuation surgery.  PMH: CVA; noncompliance with blood thinners post CVA.   OT comments  Patient progressing well.  He demonstrates ability to complete transfers and functional mobility with up to min assist using RW, toileting with min guard in bathroom.  Standing completes hand washing with min guard.  Limited by HR, tachycardia up to 205 with activity returned to 130 at rest; pt asymptomatic throughout.  RN aware and cardiology notified.  Pt reports feeling "much better" after +BM on commode.  Will follow acutely, updated dc plan to [facility].     Recommendations for follow up therapy are one component of a multi-disciplinary discharge planning process, led by the attending physician.  Recommendations may be updated based on patient status, additional functional criteria and insurance authorization.    Assistance Recommended at Discharge Frequent or constant Supervision/Assistance  Patient can return home with the following  Assistance with cooking/housework;Assist for transportation;Help with stairs or ramp for entrance;A little help with walking and/or transfers;A lot of help with bathing/dressing/bathroom   Equipment Recommendations  BSC/3in1;Tub/shower bench    Recommendations for Other Services      Precautions / Restrictions Precautions Precautions: Fall Precaution Comments: VAC Rt groin, watch HR tachy with all mobility Required Braces or Orthoses: Other Brace Other Brace: B ace wraps to both legs Restrictions Weight Bearing Restrictions: Yes RLE Weight Bearing: Weight bearing as tolerated LLE Weight  Bearing: Weight bearing as tolerated       Mobility Bed Mobility Overal bed mobility: Needs Assistance Bed Mobility: Supine to Sit, Sit to Supine     Supine to sit: Min guard Sit to supine: Supervision   General bed mobility comments: HOB 20 degrees with limited use of single rail, able to return to bed without physical assist and supervision for lines. pt denied OOB to chair    Transfers Overall transfer level: Needs assistance Equipment used: Rolling walker (2 wheels) Transfers: Sit to/from Stand Sit to Stand: Min assist           General transfer comment: cues for hand placement with slight elevation of bed, min assist to rise     Balance   Sitting-balance support: Feet supported           Standing balance comment: RW for standing, able to stand without UE support during ADLs briefly but preference for 1 UE support                           ADL either performed or assessed with clinical judgement   ADL Overall ADL's : Needs assistance/impaired     Grooming: Wash/dry hands;Min guard;Standing           Upper Body Dressing : Set up;Sitting   Lower Body Dressing: Maximal assistance;Sit to/from stand   Toilet Transfer: Minimal assistance;Ambulation;Rolling walker (2 wheels) Toilet Transfer Details (indicate cue type and reason): to [facility] over toilet Toileting- Clothing Manipulation and Hygiene: Min guard;Sit to/from stand       Functional mobility during ADLs: Minimal assistance;Rolling walker (2 wheels);+2 for safety/equipment General ADL Comments: pt progressing well, limited by HR    Extremity/Trunk Assessment  Vision       Perception     Praxis      Cognition Arousal/Alertness: Awake/alert Behavior During Therapy: WFL for tasks assessed/performed Overall Cognitive Status: Within Functional Limits for tasks assessed                                          Exercises      Shoulder  Instructions       General Comments watch HR, elevated during session with activity to max 205 but returned to 130s at rest.    Pertinent Vitals/ Pain       Pain Assessment Pain Assessment: 0-10 Pain Score: 6  Pain Location: hips Pain Descriptors / Indicators: Burning, Aching Pain Intervention(s): Limited activity within patient's tolerance, Monitored during session, Repositioned  Home Living                                          Prior Functioning/Environment              Frequency  Min 2X/week        Progress Toward Goals  OT Goals(current goals can now be found in the care plan section)  Progress towards OT goals: Progressing toward goals  Acute Rehab OT Goals Patient Stated Goal: get home OT Goal Formulation: With patient Time For Goal Achievement: 06/25/22 Potential to Achieve Goals: Good  Plan Discharge plan needs to be updated;Frequency remains appropriate    Co-evaluation    PT/OT/SLP Co-Evaluation/Treatment: Yes   PT goals addressed during session: Mobility/safety with mobility OT goals addressed during session: ADL's and self-care      AM-PAC OT "6 Clicks" Daily Activity     Outcome Measure   Help from another person eating meals?: None Help from another person taking care of personal grooming?: A Little Help from another person toileting, which includes using toliet, bedpan, or urinal?: A Little Help from another person bathing (including washing, rinsing, drying)?: A Lot Help from another person to put on and taking off regular upper body clothing?: A Little Help from another person to put on and taking off regular lower body clothing?: A Lot 6 Click Score: 17    End of Session Equipment Utilized During Treatment: Rolling walker (2 wheels);Gait belt  OT Visit Diagnosis: Unsteadiness on feet (R26.81)   Activity Tolerance Patient tolerated treatment well   Patient Left in bed;with call bell/phone within reach    Nurse Communication Mobility status;Other (comment) (HR)        [id number] OT Time Calculation (min): 39 min  Charges: OT General Charges $OT Visit: 1 Visit OT Treatments $Self Care/Home Management : 8-22 mins  [place] Office [phone]   Delight Stare 06/16/2022, 9:21 AM

## 2022-06-16 NOTE — Progress Notes (Signed)
ANTICOAGULATION CONSULT NOTE-Follow Up  Pharmacy Consult for heparin Indication:  Aortic thrombus  Allergies  Allergen Reactions   Cantaloupe (Diagnostic) Other (See Comments)    Patient states it causes his throat to itch   Carrot [Daucus Carota] Other (See Comments)    Patient states it causes his throat to itch   Codeine Nausea And Vomiting    Patient Measurements: Height: 6\' 4"  (193 cm) Weight: 106.5 kg (234 lb 12.6 oz) IBW/kg (Calculated) : 86.8 Heparin Dosing Weight: 108.6kg  Vital Signs: Temp: 98.1 F (36.7 C) (03/30 1727) Temp Source: Oral (03/30 1727) BP: 133/91 (03/30 1727) Pulse Rate: 89 (03/30 1727)  Labs: Recent Labs    06/14/22 0549 06/14/22 1634 06/15/22 0055 06/16/22 0103 06/16/22 0912 06/16/22 1646  HGB 10.3*  --  9.5* 10.5*  --   --   HCT 30.8*  --  28.4* 31.3*  --   --   PLT 103*  --  101* 92*  --   --   HEPARINUNFRC 0.79*   < > 0.50 0.28* 0.27* 0.23*  CREATININE  --   --  1.05 0.93  --   --    < > = values in this interval not displayed.     Estimated Creatinine Clearance: 103.2 mL/min (by C-G formula based on SCr of 0.93 mg/dL).   Medical History: Past Medical History:  Diagnosis Date   A-fib ([place])    Acute ischemic stroke ([place])    Asthma    Gout    Hypertension    MRSA (methicillin resistant staph aureus) culture positive 2009    Medications:  Medications Prior to Admission  Medication Sig Dispense Refill Last Dose   aspirin 325 MG tablet Take 325 mg by mouth daily as needed for mild pain or headache.   Past Week   ibuprofen (ADVIL) 200 MG tablet Take 600-800 mg by mouth daily as needed for mild pain or headache.   Past Week   Multiple Vitamin (MULTIVITAMIN) tablet Take 1 tablet by mouth daily.   Past Week   albuterol (PROVENTIL HFA;VENTOLIN HFA) 108 (90 Base) MCG/ACT inhaler Inhale 2 puffs into the lungs every 6 (six) hours as needed for wheezing or shortness of breath. (Patient not taking: Reported on 06/12/2022) 1 Inhaler 12 Not  Taking   colchicine 0.6 MG tablet Take 1 tablet (0.6 mg total) by mouth daily. (Patient not taking: Reported on 06/12/2022) 30 tablet 2 Not Taking   cyclobenzaprine (FLEXERIL) 10 MG tablet Take 1 tablet (10 mg total) by mouth 3 (three) times daily as needed for muscle spasms. (Patient not taking: Reported on 06/12/2022) 30 tablet 2 Not Taking   lisinopril (PRINIVIL,ZESTRIL) 10 MG tablet Take 1 tablet (10 mg total) by mouth daily. (Patient not taking: Reported on 06/12/2022) 90 tablet 1 Not Taking   metoprolol succinate (TOPROL-XL) 25 MG 24 hr tablet Take 1 tablet (25 mg total) by mouth daily. (Patient not taking: Reported on 06/12/2022) 30 tablet 2 Not Taking   rivaroxaban (XARELTO) 20 MG TABS tablet Take 1 tablet (20 mg total) by mouth daily with supper. (Patient not taking: Reported on 06/12/2022) 30 tablet 2 Not Taking   Scheduled:   aspirin EC  81 mg Oral Daily   atorvastatin  40 mg Oral Daily   Chlorhexidine Gluconate Cloth  6 each Topical Daily   docusate sodium  100 mg Oral Daily   folic acid  1 mg Oral Daily   metoprolol succinate  50 mg Oral BID   multivitamin with  minerals  1 tablet Oral Daily   pantoprazole  40 mg Oral Daily   [START ON 06/17/2022] sacubitril-valsartan  1 tablet Oral BID   spironolactone  25 mg Oral Daily   thiamine  100 mg Oral Daily   Assessment: 6 yoM with PMH of afib, noncompliant with AC, remote hx CVA, tobacco + marijuana, EtOH who was 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 He underwent aortic, bilateral iliofemoral, femoral-popliteal embolectomy and post surgery had right groin expanding hematoma- drain was placed and anticoag was restarted. Patient had heparin held again due to platelet drop. Platelets have since increased and pharmacy consulted to dose heparin, no bolus given bleed history.   Heparin level today is subtherapeutic at 0.27, on 1200 units/hr. Hgb 10.5, plt 92--low, stable  and pharmacy will continue to monitor. No line issues or signs/symptoms of bleeding per nursing. Patient was previously at higher end of therapeutic range (heparin level 0.62) on 1250 units/hr. Will increase to this rate again due to continued subtherapeutic heparin level. Nursing instructed to notify pharmacy of any s/sx of bleeding.   Heparin level came subtherapeutic again this PM. No bleeding issue per Rn. We will increase and check another level.  Goal of Therapy:  Heparin level 0.3-0.7 units/ml   >will aim for 0.3-0.5 given bleed hx  Monitor platelets by anticoagulation protocol: Yes   Plan:  - Increase heparin gtt to 1400 units/hr to target lower end of goal range with bleed hx - F/u 6hr heparin level  - Monitor heparin level, CBC, s/sx bleeding daily    Onnie Boer, PharmD, [place], AAHIVP, CPP Infectious Disease Pharmacist 06/16/2022 5:37 PM

## 2022-06-17 LAB — BASIC METABOLIC PANEL
Anion gap: 5 (ref 5–15)
BUN: 16 mg/dL (ref 8–23)
CO2: 22 mmol/L (ref 22–32)
Calcium: 8.2 mg/dL — ABNORMAL LOW (ref 8.9–10.3)
Chloride: 103 mmol/L (ref 98–111)
Creatinine, Ser: 0.96 mg/dL (ref 0.61–1.24)
GFR, Estimated: >60 mL/min (ref >60)
Glucose, Bld: 93 mg/dL (ref 70–99)
Potassium: 3.7 mmol/L (ref 3.5–5.1)
Sodium: 130 mmol/L — ABNORMAL LOW (ref 135–145)

## 2022-06-17 LAB — CBC
HCT: 29.1 % — ABNORMAL LOW (ref 39.0–52.0)
Hemoglobin: 10 g/dL — ABNORMAL LOW (ref 13.0–17.0)
MCH: 31.7 pg (ref 26.0–34.0)
MCHC: 34.4 g/dL (ref 30.0–36.0)
MCV: 92.4 fL (ref 80.0–100.0)
Platelets: 85 10*3/uL — ABNORMAL LOW (ref 150–400)
RBC: 3.15 MIL/uL — ABNORMAL LOW (ref 4.22–5.81)
RDW: 21.1 % — ABNORMAL HIGH (ref 11.5–15.5)
WBC: 7.6 10*3/uL (ref 4.0–10.5)
nRBC: 0 % (ref 0.0–0.2)

## 2022-06-17 LAB — HEPARIN LEVEL (UNFRACTIONATED)
Heparin Unfractionated: 0.38 IU/mL (ref 0.30–0.70)
Heparin Unfractionated: 0.36 IU/mL (ref 0.30–0.70)

## 2022-06-17 LAB — MAGNESIUM: Magnesium: 2 mg/dL (ref 1.7–2.4)

## 2022-06-17 NOTE — Progress Notes (Signed)
Mobility Specialist Progress Note    06/17/22 1506  Mobility  Activity Refused mobility   Pt stated he is in a lot of pain and has been getting up to use the bathroom. Will f/u as schedule permits.   Hildred Alamin Mobility Specialist  Please Psychologist, sport and exercise or Rehab Office at [phone]

## 2022-06-17 NOTE — Progress Notes (Signed)
ANTICOAGULATION CONSULT NOTE-Follow Up  Pharmacy Consult for heparin Indication:  Aortic thrombus  Allergies  Allergen Reactions   Cantaloupe (Diagnostic) Other (See Comments)    Patient states it causes his throat to itch   Carrot [Daucus Carota] Other (See Comments)    Patient states it causes his throat to itch   Codeine Nausea And Vomiting    Patient Measurements: Height: 6\' 4"  (193 cm) Weight: 106.5 kg (234 lb 12.6 oz) IBW/kg (Calculated) : 86.8 Heparin Dosing Weight: 108.6kg  Vital Signs: Temp: 98.3 F (36.8 C) (03/31 0759) Temp Source: Oral (03/31 0759) BP: 161/98 (03/31 0759) Pulse Rate: 88 (03/31 0339)  Labs: Recent Labs    06/15/22 0055 06/16/22 0103 06/16/22 0912 06/16/22 1646 06/17/22 0111 06/17/22 1028  HGB 9.5* 10.5*  --   --  10.0*  --   HCT 28.4* 31.3*  --   --  29.1*  --   PLT 101* 92*  --   --  85*  --   HEPARINUNFRC 0.50 0.28*   < > 0.23* 0.38 0.36  CREATININE 1.05 0.93  --   --  0.96  --    < > = values in this interval not displayed.     Estimated Creatinine Clearance: 100 mL/min (by C-G formula based on SCr of 0.96 mg/dL).   Medical History: Past Medical History:  Diagnosis Date   A-fib ([place])    Acute ischemic stroke ([place])    Asthma    Gout    Hypertension    MRSA (methicillin resistant staph aureus) culture positive 2009    Medications:  Medications Prior to Admission  Medication Sig Dispense Refill Last Dose   aspirin 325 MG tablet Take 325 mg by mouth daily as needed for mild pain or headache.   Past Week   ibuprofen (ADVIL) 200 MG tablet Take 600-800 mg by mouth daily as needed for mild pain or headache.   Past Week   Multiple Vitamin (MULTIVITAMIN) tablet Take 1 tablet by mouth daily.   Past Week   albuterol (PROVENTIL HFA;VENTOLIN HFA) 108 (90 Base) MCG/ACT inhaler Inhale 2 puffs into the lungs every 6 (six) hours as needed for wheezing or shortness of breath. (Patient not taking: Reported on 06/12/2022) 1 Inhaler 12 Not  Taking   colchicine 0.6 MG tablet Take 1 tablet (0.6 mg total) by mouth daily. (Patient not taking: Reported on 06/12/2022) 30 tablet 2 Not Taking   cyclobenzaprine (FLEXERIL) 10 MG tablet Take 1 tablet (10 mg total) by mouth 3 (three) times daily as needed for muscle spasms. (Patient not taking: Reported on 06/12/2022) 30 tablet 2 Not Taking   lisinopril (PRINIVIL,ZESTRIL) 10 MG tablet Take 1 tablet (10 mg total) by mouth daily. (Patient not taking: Reported on 06/12/2022) 90 tablet 1 Not Taking   metoprolol succinate (TOPROL-XL) 25 MG 24 hr tablet Take 1 tablet (25 mg total) by mouth daily. (Patient not taking: Reported on 06/12/2022) 30 tablet 2 Not Taking   rivaroxaban (XARELTO) 20 MG TABS tablet Take 1 tablet (20 mg total) by mouth daily with supper. 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 He underwent aortic, bilateral iliofemoral, femoral-popliteal embolectomy and post surgery had right groin expanding hematoma- drain was placed and anticoag was restarted. Patient had heparin held again due to platelet drop. Platelets have since increased and pharmacy consulted to dose heparin, no bolus given bleed history.   Heparin level today is therapeutic at 0.36, on 1400 units/hr. Hgb 10--stable, plt 85--low, stable and pharmacy will continue to monitor. No line issues or signs/symptoms of  bleeding reported.   Goal of Therapy:  Heparin level 0.3-0.7 units/ml   >will aim for 0.3-0.5 given bleed hx  Monitor platelets by anticoagulation protocol: Yes   Plan:  - Continue heparin at 1400 units/hr  - Monitor heparin level, CBC, s/sx bleeding daily  - f/u transition to [place] at discharge (potentially tomorrow)    Billey Gosling, PharmD PGY1 Pharmacy Resident 3/31/202411:46 AM

## 2022-06-17 NOTE — Progress Notes (Signed)
Rounding Note    Patient Name: DEBRA WARMOTH Date of Encounter: 06/17/2022  [place] Cardiologist: None   Subjective   No acute complaints  Inpatient Medications    Scheduled Meds:  aspirin EC  81 mg Oral Daily   atorvastatin  40 mg Oral Daily   docusate sodium  100 mg Oral Daily   folic acid  1 mg Oral Daily   metoprolol succinate  50 mg Oral BID   multivitamin with minerals  1 tablet Oral Daily   pantoprazole  40 mg Oral Daily   sacubitril-valsartan  1 tablet Oral BID   spironolactone  25 mg Oral Daily   thiamine  100 mg Oral Daily   Continuous Infusions:  sodium chloride     heparin 1,400 Units/hr (06/17/22 0600)   magnesium sulfate bolus IVPB     PRN Meds: sodium chloride, acetaminophen **OR** acetaminophen, alum & mag hydroxide-simeth, bisacodyl, cyclobenzaprine, guaiFENesin-dextromethorphan, hydrALAZINE, HYDROmorphone (DILAUDID) injection, labetalol, magnesium sulfate bolus IVPB, midazolam, ondansetron, oxyCODONE, phenol, potassium chloride, senna-docusate, sodium phosphate   Vital Signs    Vitals:   06/16/22 2036 06/16/22 2329 06/17/22 0339 06/17/22 0759  BP: (!) 142/88 125/73 (!) 138/98 (!) 161/98  Pulse: 88 91 88   Resp: 18 15 18  (!) 22  Temp: 98.3 F (36.8 C) 98.3 F (36.8 C) 98 F (36.7 C) 98.3 F (36.8 C)  TempSrc: Oral Oral Oral Oral  SpO2: 99% 100% 100%   Weight:      Height:        Intake/Output Summary (Last 24 hours) at 06/17/2022 1045 Last data filed at 06/17/2022 0600 Gross per 24 hour  Intake 460.51 ml  Output 875 ml  Net -414.49 ml      06/15/2022    3:35 AM 06/14/2022    2:23 PM 06/10/2022   12:03 AM  Last 3 Weights  Weight (lbs) 234 lb 12.6 oz 239 lb 6.7 oz 240 lb  Weight (kg) 106.5 kg 108.6 kg 108.863 kg      Telemetry    Atrial fibrillation- Personally Reviewed  ECG    None new- Personally Reviewed  Physical Exam   GEN: No acute distress.   Neck: No JVD Cardiac: Irregular, no murmurs, rubs, or  gallops.  Respiratory: Clear to auscultation bilaterally. GI: Soft, nontender, non-distended  MS: No edema; No deformity. Neuro:  Nonfocal  Psych: Normal affect   Labs    High Sensitivity Troponin:  No results for input(s): "TROPONINIHS" in the last 720 hours.   Chemistry Recent Labs  Lab 06/11/22 1114 06/11/22 2056 06/12/22 0455 06/12/22 1904 06/13/22 0031 06/15/22 0055 06/16/22 0103 06/17/22 0111  NA 133* 131*   < > 132*   < > 135 131* 130*  K 3.5 3.6   < > 4.0   < > 4.2 3.7 3.7  CL 106 102   < > 103   < > 105 101 103  CO2 21* 21*   < > 22   < > 24 21* 22  GLUCOSE 127* 127*   < > 189*   < > 105* 99 93  BUN 20 21   < > 21   < > 25* 16 16  CREATININE 1.08 1.05   < > 1.19   < > 1.05 0.93 0.96  CALCIUM 8.0* 8.0*   < > 8.2*   < > 8.4* 8.5* 8.2*  MG  --   --   --   --   --   --  2.0 2.0  PROT 4.6* 4.2*  --  5.7*  --   --   --   --   ALBUMIN 2.2* 2.1*  --  2.6*  --   --   --   --   AST 133* 120*  --  108*  --   --   --   --   ALT 75* 65*  --  65*  --   --   --   --   ALKPHOS 54 55  --  67  --   --   --   --   BILITOT 1.0 1.9*  --  2.3*  --   --   --   --   GFRNONAA >60 >60   < > >60   < > >60 >60 >60  ANIONGAP 6 8   < > 7   < > 6 9 5    < > = values in this interval not displayed.    Lipids  Recent Labs  Lab 06/11/22 0455  CHOL 68  TRIG 36  HDL 16*  LDLCALC 45  CHOLHDL 4.3    Hematology Recent Labs  Lab 06/15/22 0055 06/16/22 0103 06/17/22 0111  WBC 10.7* 7.0 7.6  RBC 3.02* 3.33* 3.15*  HGB 9.5* 10.5* 10.0*  HCT 28.4* 31.3* 29.1*  MCV 94.0 94.0 92.4  MCH 31.5 31.5 31.7  MCHC 33.5 33.5 34.4  RDW 21.7* 21.2* 21.1*  PLT 101* 92* 85*   Thyroid No results for input(s): "TSH", "FREET4" in the last 168 hours.  BNPNo results for input(s): "BNP", "PROBNP" in the last 168 hours.  DDimer No results for input(s): "DDIMER" in the last 168 hours.   Radiology    No results found.  Cardiac Studies   TTE 06/11/22 67 y.o. male   1. Technically difficult study with  poor visualization of cardiac  structures. Patient in atrial fibrillation.   2. Left ventricular systolic function is mildly reduced secondary to  atrial fibrillation with an estimated EF of 40%. There is significant beat  to beat variability. Consider repeat limited study once patient in sinus  rhythm.   3. Right ventricular systolic function is normal. The right ventricular  size is normal. There is normal pulmonary artery systolic pressure.   4. The mitral valve is normal in structure. No evidence of mitral valve  regurgitation. No evidence of mitral stenosis.   5. The aortic valve is normal in structure. Aortic valve regurgitation is  not visualized. No aortic stenosis is present.   6. The inferior vena cava is normal in size with greater than 50%  respiratory variability, suggesting right atrial pressure of 3 mmHg.  Patient Profile     67 year old male with a history of atrial fibrillation, CVA, asthma, hypertension presented to the hospital with lower extremity pain found to have aortic and iliac thrombus went into atrial fibrillation with rapid rates.  Assessment & Plan    1.  Atrial fibrillation with rapid ventricular response: Currently on heparin.  Would transition to Xarelto at discharge.  Well rate controlled on current dose of metoprolol.  Lynette Noah continue with current management.  Follow-up has been arranged in cardiology clinic.  2.  Chronic systolic heart failure: Patient was initially on lisinopril.  Has started metoprolol.  Antrone [place] hold lisinopril for now and prep for starting Entresto.  Again has follow-up with cardiology as an outpatient.  4.  Aortic embolism: Plan per vascular surgery     For questions or updates, please contact [facility] Please consult [email or web address] for contact info under        Signed, Barth Trella Meredith Leeds, MD  06/17/2022, 10:45 AM

## 2022-06-17 NOTE — Progress Notes (Addendum)
  Progress Note    06/17/2022 9:01 AM 5 Days Post-Op  Subjective:  no complaints   Vitals:   06/17/22 0339 06/17/22 0759  BP: (!) 138/98 (!) 161/98  Pulse: 88   Resp: 18 (!) 22  Temp: 98 F (36.7 C) 98.3 F (36.8 C)  SpO2: 100%    Physical Exam: Lungs:  non labored Incisions:  groin incisions remain soft Extremities:  palpable PT pulses BLE; BLE less edematous Neurologic: A&O  CBC    Component Value Date/Time   WBC 7.6 06/17/2022 0111   RBC 3.15 (L) 06/17/2022 0111   HGB 10.0 (L) 06/17/2022 0111   HCT 29.1 (L) 06/17/2022 0111   PLT 85 (L) 06/17/2022 0111   MCV 92.4 06/17/2022 0111   MCH 31.7 06/17/2022 0111   MCHC 34.4 06/17/2022 0111   RDW 21.1 (H) 06/17/2022 0111   LYMPHSABS 3.1 06/10/2022 0005   MONOABS 0.9 06/10/2022 0005   EOSABS 0.3 06/10/2022 0005   BASOSABS 0.1 06/10/2022 0005    BMET    Component Value Date/Time   NA 130 (L) 06/17/2022 0111   K 3.7 06/17/2022 0111   CL 103 06/17/2022 0111   CO2 22 06/17/2022 0111   GLUCOSE 93 06/17/2022 0111   BUN 16 06/17/2022 0111   CREATININE 0.96 06/17/2022 0111   CALCIUM 8.2 (L) 06/17/2022 0111   GFRNONAA >60 06/17/2022 0111   GFRAA >60 02/21/2018 0641    INR    Component Value Date/Time   INR 1.3 (H) 06/10/2022 0005     Intake/Output Summary (Last 24 hours) at 06/17/2022 0901 Last data filed at 06/17/2022 0600 Gross per 24 hour  Intake 460.51 ml  Output 875 ml  Net -414.49 ml     Assessment/Plan:  67 y.o. male with acute aortic occlusion 5 Days Post-Op   BLE well perfused with palpable PT pulses; legs much less edematous today Prevena vac can be removed tomorrow or if battery runs out today HR much better controlled today; appreciate assistance from Cardiology Continue heparin today with plans to transition to [place] at discharge Potential d/c home tomorrow   Dagoberto Ligas, PA-C [facility] [phone] 06/17/2022 9:01 AM  I agree with the above.  I have seen and  evaluated the patient.  He continues to work on heart rate control which is improved today.  His fasciotomy incisions are healing nicely.  Anticipate discharge in the next few days.  He will need conversion to a DOAC at discharge  [facility]

## 2022-06-17 NOTE — Progress Notes (Signed)
ANTICOAGULATION CONSULT NOTE - Follow Up Consult  Pharmacy Consult for heparin Indication:  aortic thrombus  Labs: Recent Labs    06/15/22 0055 06/16/22 0103 06/16/22 0912 06/16/22 1646 06/17/22 0111  HGB 9.5* 10.5*  --   --  10.0*  HCT 28.4* 31.3*  --   --  29.1*  PLT 101* 92*  --   --  85*  HEPARINUNFRC 0.50 0.28* 0.27* 0.23* 0.38  CREATININE 1.05 0.93  --   --  0.96     Assessment/Plan:  67yo male therapeutic on heparin after rate changes. Will continue infusion at current rate of 1400 units/hr and confirm stable with additional level.     Wynona Neat, PharmD, BCPS  06/17/2022,2:20 AM

## 2022-06-18 ENCOUNTER — Other Ambulatory Visit (HOSPITAL_COMMUNITY): Payer: Self-pay

## 2022-06-18 DIAGNOSIS — I4819 Other persistent atrial fibrillation: Secondary | ICD-10-CM

## 2022-06-18 LAB — CBC
HCT: 31.3 % — ABNORMAL LOW (ref 39.0–52.0)
Hemoglobin: 10 g/dL — ABNORMAL LOW (ref 13.0–17.0)
MCH: 31.2 pg (ref 26.0–34.0)
MCHC: 31.9 g/dL (ref 30.0–36.0)
MCV: 97.5 fL (ref 80.0–100.0)
Platelets: 87 10*3/uL — ABNORMAL LOW (ref 150–400)
RBC: 3.21 MIL/uL — ABNORMAL LOW (ref 4.22–5.81)
RDW: 20.4 % — ABNORMAL HIGH (ref 11.5–15.5)
WBC: 9.1 10*3/uL (ref 4.0–10.5)
nRBC: 0 % (ref 0.0–0.2)

## 2022-06-18 LAB — BASIC METABOLIC PANEL
Anion gap: 10 (ref 5–15)
BUN: 15 mg/dL (ref 8–23)
CO2: 21 mmol/L — ABNORMAL LOW (ref 22–32)
Calcium: 8.5 mg/dL — ABNORMAL LOW (ref 8.9–10.3)
Chloride: 98 mmol/L (ref 98–111)
Creatinine, Ser: 0.99 mg/dL (ref 0.61–1.24)
GFR, Estimated: >60 mL/min (ref >60)
Glucose, Bld: 106 mg/dL — ABNORMAL HIGH (ref 70–99)
Potassium: 4.2 mmol/L (ref 3.5–5.1)
Sodium: 129 mmol/L — ABNORMAL LOW (ref 135–145)

## 2022-06-18 LAB — HEPARIN LEVEL (UNFRACTIONATED): Heparin Unfractionated: 0.28 IU/mL — ABNORMAL LOW (ref 0.30–0.70)

## 2022-06-18 LAB — HEMOGLOBIN A1C
Hgb A1c MFr Bld: 5.8 % — ABNORMAL HIGH (ref 4.8–5.6)
Mean Plasma Glucose: 120 mg/dL

## 2022-06-18 LAB — MAGNESIUM: Magnesium: 1.9 mg/dL (ref 1.7–2.4)

## 2022-06-18 MED ORDER — RIVAROXABAN 20 MG PO TABS
20.0000 mg | ORAL_TABLET | Freq: Every day | ORAL | Status: DC
  Administered 2022-06-18: 20 mg via ORAL
  Filled 2022-06-18: qty 1

## 2022-06-18 MED ORDER — SPIRONOLACTONE 12.5 MG HALF TABLET
12.5000 mg | ORAL_TABLET | Freq: Every day | ORAL | Status: DC
  Administered 2022-06-19: 12.5 mg via ORAL
  Filled 2022-06-18: qty 1

## 2022-06-18 NOTE — Progress Notes (Signed)
   Heart Failure Stewardship Pharmacist Progress Note   PCP: Azzie Glatter, FNP PCP-Cardiologist: None    HPI:  67 yo M with PMH of afib, CVA, HTN, asthma, and gout.   Presented to the ED on 3/24 with bilateral leg pain and numbness of both lower legs. 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 L common femoral also has thrombus. Vascular consulted, taken to OR for urgent revascularization.    ECHO done on 3/25 and LVEF is 40%. Last ECHO from 02/2018 EF was 45-50%.  Current HF Medications: Beta Blocker: metoprolol succinate 50 mg BID ACE/ARB/ARNI: Entresto 24/26 mg BID MRA: spironolactone 12.5 mg daily   Prior to admission HF Medications: Beta blocker: metoprolol succinate 25 mg once daily ACE/ARB/ARNI: lisinopril 10 mg once daily  Pertinent Lab Values: Serum creatinine 0.99, BUN 15, Potassium 4.2, Sodium 129, Magnesium 1.9  Vital Signs: Weight: 234 lbs (admission weight: 240 lbs) Blood pressure: 110-130/70-90s Heart rate: 70-90s  I/O: -0.3L yesterday; net +3L  Does the patient have prescription insurance?  No - only has Medicare A/B   Does the patient qualify for medication assistance through manufacturers or grants?   Yes Eligible grants and/or patient assistance programs: Eliquis, Ferne Coe Medication assistance applications in progress: Eliquis, Entresto Medication assistance applications approved: none Approved medication assistance renewals will be completed by: Vascular   Outpatient Pharmacy:  Prior to admission outpatient [facility] Is the patient willing to use [facility] at discharge? Yes Is the patient willing to transition their outpatient pharmacy to utilize a [facility] outpatient pharmacy?   Yes - transitioned to [facility] OP with mail order  Assessment: 1. Acute systolic CHF (LVEF [id number]). NYHA class II symptoms.  - Not volume overloaded on exam, no indication for diuretics. Strict I/Os and daily  weights. Keep K>4 and Mg>2. - Continue metoprolol XL 50 mg BID - Continue Entresto 24/26 mg BID - Agree with decreasing to spironolactone 12.5 mg daily with dizziness today - Consider SGLT2i prior to discharge - Jardiance preferred with no prescription insurance. Can qualify for patient assistance.    Plan: 1) Medication changes recommended at this time: - Agree with changes  2) Patient assistance: - No prescription insurance - Eliquis and Entresto patient assistance initiated. Can qualify for Jardiance patient assistance if this is added.  - Pharmacy transitioned to [facility] OP with mail order   3)  Education  - Patient has been educated on current HF medications and potential additions to HF medication regimen - Patient verbalizes understanding that over the next few months, these medication doses may change and more medications may be added to optimize HF regimen - Patient has been educated on basic disease state pathophysiology and goals of therapy  Kerby Nora, PharmD, BCPS Heart Failure Stewardship Pharmacist Phone [phone]

## 2022-06-18 NOTE — Progress Notes (Signed)
Physical Therapy Treatment Patient Details Name: [name] [name] MRN: [id number] DOB: June 03, 1955 Today's Date: 06/18/2022   History of Present Illness 67 yo male admitted 3/23 with numbness and B leg pain. Pt found to have clots in BLEs s/p aortic emoliectomy, B ileofemoral embolectomy, B fem/pop embolectomies, B 4 compartment fasciotomies and a Rt groin expanding hematoma evacuation surgery. Pt with orthostatic hypotension and tachycardia 4/1. PMH: CVA; noncompliance with blood thinners post CVA.    PT Comments    Pt received seated EOB, agreeable to limited session at bedside, pt with pale pallor and tachy with minimal mobility (standing at bedside) and with orthostatic hypotension so defer gait/stair trial during session. Pt recently ambulated from bathroom with NT and MD in room just prior to session, he states TED hose are being ordered for pt which hopefully will help with hemodynamic stability for therapy sessions. Pt tachy to 160 bpm with standing at bedside and HR 100-110 bpm resting in supine. Of note, pt states he is having left sided mouth/tooth pain and he reports a history of poor dentition. Pt states the hydraulics on the end of his bed are broken, RN notified his bed will need to be tagged and a new bed ordered. Pt continues to benefit from PT services to progress toward functional mobility goals.   Orthostatic BPs  Supine (~30 mins prior to session, before pt toilet/bath) 132/89 (99)  Sitting (sitting EOB after standing for bath at sink) 91/74 (80); HR 100 bpm  Standing 1 min 78/61 (68); HR 120-160 bpm (variable), lightheaded/fatigued  Supine (HOB ~30*) 97/60 (72) HR 106 bpm      Recommendations for follow up therapy are one component of a multi-disciplinary discharge planning process, led by the attending physician.  Recommendations may be updated based on patient status, additional functional criteria and insurance authorization.  Follow Up Recommendations        Assistance Recommended at Discharge Intermittent Supervision/Assistance  Patient can return home with the following A little help with walking and/or transfers;A little help with bathing/dressing/bathroom;Assist for transportation;Assistance with cooking/housework   Equipment Recommendations  Rolling walker (2 wheels);BSC/3in1    Recommendations for Other Services  (pt refusing AIR)     Precautions / Restrictions Precautions Precautions: Fall Precaution Comments: VAC Rt groin, watch HR tachy with all mobility Required Braces or Orthoses: Other Brace Other Brace: MD ordering TED hose (not yet arrived during PT session 4/1) Restrictions Weight Bearing Restrictions: Yes RLE Weight Bearing: Weight bearing as tolerated LLE Weight Bearing: Weight bearing as tolerated     Mobility  Bed Mobility Overal bed mobility: Needs Assistance Bed Mobility: Sit to Supine       Sit to supine: Min guard   General bed mobility comments: Pt able to return to bed with cues for use of gait belt as a leg lifter, pt using that device for RLE over EOB then able to carry LLE over side of bed without using leg lifter. Use of bed rail intermittently. Heels floated/BLE elevated on x2 pillows once in supine.    Transfers Overall transfer level: Needs assistance Equipment used: Rolling walker (2 wheels) Transfers: Sit to/from Stand Sit to Stand: Min assist           General transfer comment: cues for hand placement with elevation of bed a few inches, min assist to rise and steady. Sidesteps x2 along EOB prior to sitting. Tachy and with orthostatic symptoms standing so defer gait. Pt recently walked back from bathroom with NT assist.  Ambulation/Gait               General Gait Details: defer, see comments above   Stairs             Wheelchair Mobility    Modified Rankin (Stroke Patients Only)       Balance Overall balance assessment: Needs assistance Sitting-balance  support: Feet supported Sitting balance-Leahy Scale: Good     Standing balance support: Bilateral upper extremity supported, Reliant on assistive device for balance, During functional activity Standing balance-Leahy Scale: Poor Standing balance comment: RW for standing, able to stand without UE support during ADLs briefly but preference for 1 UE support                            Cognition Arousal/Alertness: Awake/alert Behavior During Therapy: WFL for tasks assessed/performed Overall Cognitive Status: Within Functional Limits for tasks assessed                                 General Comments: Some awareness of deficits, self-limiting due to pain/fatigue 4/1 but had been up with nursing staff to bathe just prior to session.        Exercises Other Exercises Other Exercises: seated BLE AROM: LAQ, ankle pumps x10 reps ea    General Comments General comments (skin integrity, edema, etc.): defer, BP 78/61 (68) standing and pt with pale pallor, tachy (up to 160 bpm briefly) and fatigued/lightheaded standing at bedside. BP improved to 97/60 (72) HR 106 bpm once back to supine with HOB ~30      Pertinent Vitals/Pain Pain Assessment Pain Assessment: 0-10 Pain Score: 6  Pain Location: BLE and L side of mouth where he is missing some teeth Pain Descriptors / Indicators: Grimacing, Sore, Discomfort Pain Intervention(s): Limited activity within patient's tolerance, Monitored during session, Repositioned     PT Goals (current goals can now be found in the care plan section) Acute Rehab PT Goals Patient Stated Goal: get back to walking and to see live music PT Goal Formulation: With patient Time For Goal Achievement: 06/27/22 Progress towards PT goals: Progressing toward goals    Frequency    Min 1X/week      PT Plan Current plan remains appropriate       AM-PAC PT "6 Clicks" Mobility   Outcome Measure  Help needed turning from your back to your  side while in a flat bed without using bedrails?: A Little Help needed moving from lying on your back to sitting on the side of a flat bed without using bedrails?: A Little Help needed moving to and from a bed to a chair (including a wheelchair)?: A Little Help needed standing up from a chair using your arms (e.g., wheelchair or bedside chair)?: A Lot (from lower height surface) Help needed to walk in hospital room?: A Little Help needed climbing 3-5 steps with a railing? : Total 6 Click Score: 15    End of Session Equipment Utilized During Treatment: Gait belt Activity Tolerance: Treatment limited secondary to medical complications (Comment);Other (comment) (pain, orthostatic hypotension, tachycardia) Patient left: in bed;with call bell/phone within reach;with bed alarm set;Other (comment) (BLE elevated with heels floated) Nurse Communication: Mobility status PT Visit Diagnosis: Unsteadiness on feet (R26.81);Other abnormalities of gait and mobility (R26.89);Muscle weakness (generalized) (M62.81);Difficulty in walking, not elsewhere classified (R26.2);Pain Pain - Right/Left:  (bil) Pain - part of body: Leg  Time: [id number] PT Time Calculation (min) (ACUTE ONLY): 21 min  Charges:  $Therapeutic Activity: 8-22 mins                     Conrado Nance P., PTA Acute Rehabilitation Services Secure Chat Preferred 9a-5:30pm Office: [place] 06/18/2022, 3:36 PM

## 2022-06-18 NOTE — Progress Notes (Signed)
Patient ambulated with assistance to bathroom. Poorly tolerated, C/O dizziness, lightheaded, nausea. HR up to 170 with exertion. Patient states he does not want to go home today.

## 2022-06-18 NOTE — Progress Notes (Signed)
Occupational Therapy Treatment Patient Details Name: [name] [name] MRN: [id number] DOB: June 11, 1955 Today's Date: 06/18/2022   History of present illness 67 yo male admitted 3/23 with numbness and B leg pain. Pt found to have clots in BLEs s/p aortic emoliectomy, B ileofemoral embolectomy, B fem/pop embolectomies, B 4 compartment fasciotomies and a Rt groin expanding hematoma evacuation surgery. Pt with orthostatic hypotension and tachycardia 4/1. PMH: CVA; noncompliance with blood thinners post CVA.   OT comments  Pt continuing to progress towards patient focused goals, still having BLE pain with movement. Pt ambulated to bathroom with Min guard assist, HR still limits ability to fully engage in functional activity and is elevated following functional ambulation and fatigue. Increased time needed in today's session for patient to complete toileting and seated pericare. Pt continues to be appropriate for Home OT services following DC to help maximize functional independence.   Recommendations for follow up therapy are one component of a multi-disciplinary discharge planning process, led by the attending physician.  Recommendations may be updated based on patient status, additional functional criteria and insurance authorization.    Assistance Recommended at Discharge Intermittent Supervision/Assistance  Patient can return home with the following  Assistance with cooking/housework;Assist for transportation;Help with stairs or ramp for entrance;A little help with walking and/or transfers;A lot of help with bathing/dressing/bathroom   Equipment Recommendations  BSC/3in1;Tub/shower bench    Recommendations for Other Services      Precautions / Restrictions Precautions Precautions: Fall Precaution Comments: watch HR tachy with all mobility Required Braces or Orthoses: Other Brace Other Brace: TED hose ordered, not arrived yet during session Restrictions Weight Bearing Restrictions: Yes RLE  Weight Bearing: Weight bearing as tolerated LLE Weight Bearing: Weight bearing as tolerated Other Position/Activity Restrictions: cannot dangle legs off edge of bed due to open fasciotomies.       Mobility Bed Mobility Overal bed mobility: Needs Assistance Bed Mobility: Sit to Supine     Supine to sit: Supervision Sit to supine: Min assist   General bed mobility comments: Min A to assist lifting RLE over bed clearance    Transfers Overall transfer level: Needs assistance Equipment used: Rolling walker (2 wheels) Transfers: Sit to/from Stand Sit to Stand: Min guard, From elevated surface           General transfer comment: Pt HR in the 120s with functional ambulation.     Balance Overall balance assessment: Needs assistance Sitting-balance support: Feet supported Sitting balance-Leahy Scale: Good     Standing balance support: Bilateral upper extremity supported, Reliant on assistive device for balance, During functional activity Standing balance-Leahy Scale: Fair Standing balance comment: stands without UE supported for brief toilet t/f                           ADL either performed or assessed with clinical judgement   ADL Overall ADL's : Needs assistance/impaired                         Toilet Transfer: Min guard;Ambulation;Rolling walker (2 wheels)   Toileting- Clothing Manipulation and Hygiene: Min guard;Sitting/lateral lean Toileting - Clothing Manipulation Details (indicate cue type and reason): Pt complete seated rear pericare.     Functional mobility during ADLs: Min guard;Rolling walker (2 wheels)      Extremity/Trunk Assessment              Vision       Perception  Praxis      Cognition Arousal/Alertness: Awake/alert Behavior During Therapy: WFL for tasks assessed/performed Overall Cognitive Status: Within Functional Limits for tasks assessed                                 General Comments: Pt  aware to keep head up during functional mobility        Exercises      Shoulder Instructions       General Comments HR in 120s walking to bathroom, pt reported mild lightheadedness. HR up to 146 when using toilet and increased to 177 bpm on return ambulation back from toilet. HR continued to be monitored and returned to 112 bpm when supine in bed. BP 114/86    Pertinent Vitals/ Pain       Pain Assessment Pain Assessment: 0-10 Pain Score: 6  Pain Location: BLE Pain Descriptors / Indicators: Grimacing, Sore, Discomfort, Burning Pain Intervention(s): Limited activity within patient's tolerance, Monitored during session  Home Living                                          Prior Functioning/Environment              Frequency  Min 2X/week        Progress Toward Goals  OT Goals(current goals can now be found in the care plan section)     Acute Rehab OT Goals Patient Stated Goal: get home OT Goal Formulation: With patient Time For Goal Achievement: 06/25/22 Potential to Achieve Goals: Good  Plan Discharge plan remains appropriate;Frequency remains appropriate    Co-evaluation                 AM-PAC OT "6 Clicks" Daily Activity     Outcome Measure   Help from another person eating meals?: None Help from another person taking care of personal grooming?: A Little Help from another person toileting, which includes using toliet, bedpan, or urinal?: A Little Help from another person bathing (including washing, rinsing, drying)?: A Lot Help from another person to put on and taking off regular upper body clothing?: A Little Help from another person to put on and taking off regular lower body clothing?: A Lot 6 Click Score: 17    End of Session Equipment Utilized During Treatment: Gait belt;Rolling walker (2 wheels)  OT Visit Diagnosis: Unsteadiness on feet (R26.81);Pain Pain - Right/Left: Left (Both) Pain - part of body: Leg   Activity  Tolerance Patient tolerated treatment well   Patient Left in bed;with call bell/phone within reach   Nurse Communication Mobility status;Other (comment) (Watch HR)        Time: 1523-1600 OT Time Calculation (min): 37 min  Charges: OT General Charges $OT Visit: 1 Visit OT Treatments $Self Care/Home Management : 8-22 mins $Therapeutic Activity: 8-22 mins  06/18/2022  AB, OTR/L  Acute Rehabilitation Services  Office: [phone]   Cori Razor 06/18/2022, 4:18 PM

## 2022-06-18 NOTE — Progress Notes (Addendum)
  Progress Note    06/18/2022 7:43 AM 6 Days Post-Op  Subjective:  pain R groin and hip   Vitals:   06/17/22 1951 06/17/22 2337  BP: 134/84 (!) 149/90  Pulse: (!) 115 (!) 106  Resp: 18 20  Temp: 98.5 F (36.9 C) 98.4 F (36.9 C)  SpO2: 99% 100%   Physical Exam: Lungs:  non labored Incisions:  R groin vac removed; viable skin edges R groin, no drainage; L groin incision well appearing; fasciotomies well appearing Extremities:  brisk PT signals by doppler Neurologic: A&O  CBC    Component Value Date/Time   WBC 9.1 06/18/2022 0111   RBC 3.21 (L) 06/18/2022 0111   HGB 10.0 (L) 06/18/2022 0111   HCT 31.3 (L) 06/18/2022 0111   PLT 87 (L) 06/18/2022 0111   MCV 97.5 06/18/2022 0111   MCH 31.2 06/18/2022 0111   MCHC 31.9 06/18/2022 0111   RDW 20.4 (H) 06/18/2022 0111   LYMPHSABS 3.1 06/10/2022 0005   MONOABS 0.9 06/10/2022 0005   EOSABS 0.3 06/10/2022 0005   BASOSABS 0.1 06/10/2022 0005    BMET    Component Value Date/Time   NA 129 (L) 06/18/2022 0111   K 4.2 06/18/2022 0111   CL 98 06/18/2022 0111   CO2 21 (L) 06/18/2022 0111   GLUCOSE 106 (H) 06/18/2022 0111   BUN 15 06/18/2022 0111   CREATININE 0.99 06/18/2022 0111   CALCIUM 8.5 (L) 06/18/2022 0111   GFRNONAA >60 06/18/2022 0111   GFRAA >60 02/21/2018 0641    INR    Component Value Date/Time   INR 1.3 (H) 06/10/2022 0005     Intake/Output Summary (Last 24 hours) at 06/18/2022 0743 Last data filed at 06/18/2022 0100 Gross per 24 hour  Intake 383.96 ml  Output 450 ml  Net -66.04 ml     Assessment/Plan:  67 y.o. male is s/p with acute aortic occlusion  6 Days Post-Op   BLE well perfused R groin incisional vac removed; place a 4x4 to wick moisture from incision Rate better controlled with current metoprolol dose; appreciate Cardiology assistance OOB with therapy this morning Home this afternoon vs tomorrow; Patient's ex wife is preparing the house for patient to stay with her  Dagoberto Ligas,  PA-C [facility] [phone] 06/18/2022 7:43 AM  VASCULAR STAFF ADDENDUM: I have independently interviewed and examined the patient. I agree with the above.  Needs enema Lower extremity compression stockings - TED hose Transition to oral AC, ASA Plan for home Tuesday v Wednesday pending continued clinical improvement.   Cassandria Santee, MD [facility] Phone Number: [phone] 06/18/2022 2:47 PM

## 2022-06-18 NOTE — TOC Progression Note (Signed)
Transition of Care (TOC) - Progression Note  Marvetta Gibbons RN, BSN Transitions of Care Unit 4E- RN Case Manager See Treatment Team for direct phone #   Patient Details  Name: [name] [name] MRN: [id number] Date of Birth: 1955/04/04  Transition of Care [facility]) CM/SW Contact  Dahlia Client Romeo Rabon, RN Phone Number: 06/18/2022, 2:07 PM  Clinical Narrative:    Noted pt prefers to return home w/ family and declined CIR admit.   CM spoke with pt at bedside- per pt he plans to return home daughter in [place] initially and then when able return to his home here in [place].  Daughter and ex-wife to assist on discharge.  Pt voiced that he would like RW at discharge- not sure if he already has one at home- will have family check- if not then CM will arrange for one- pt does not have a preference for DME provider if needed.   Discussed with pt PCP and medication needs. Pt confirmed that he does not have drug coverage under Medicare. He voiced that he is working on getting Part D, as well as met with HF pharmD who is assisting pt with patient assistance applications for Eliquis and Rush Barer (pt voiced that he has signed needed paperwork to start process).   Pt also states he went to [facility] primary clinic in past and had follow up for a short time before he stopped going- confirmed that he needs to re-establish with a clinic for primary care needs- no preference other than a clinic in [place].  CM will assist to set up f/u appointment with PCP.  TOC to continue to follow for transition needs.      Expected Discharge Plan: IP Rehab Facility Barriers to Discharge: Continued Medical Work up  Expected Discharge Plan and Services   Discharge Planning Services: CM Consult Post Acute Care Choice: Durable Medical Equipment, Home Health Living arrangements for the past 2 months: Single Family Home                 DME Arranged: Walker rolling                     Social Determinants of  Health (SDOH) Interventions SDOH Screenings   Food Insecurity: No Food Insecurity (06/13/2022)  Housing: Low Risk  (06/13/2022)  Transportation Needs: No Transportation Needs (06/13/2022)  Utilities: Not At Risk (06/15/2022)  Alcohol Screen: Low Risk  (06/13/2022)  Depression (PHQ2-9): Low Risk  (03/07/2018)  Financial Resource Strain: Low Risk  (06/13/2022)  Tobacco Use: High Risk (06/16/2022)    Readmission Risk Interventions     No data to display

## 2022-06-18 NOTE — Progress Notes (Signed)
Rounding Note    Patient Name: [name] [name] Date of Encounter: 06/18/2022  [facility] Cardiologist: Dr Curt Bears  Subjective   No CP or dyspnea; mild dizziness with standing earlier.  Inpatient Medications    Scheduled Meds:  aspirin EC  81 mg Oral Daily   atorvastatin  40 mg Oral Daily   docusate sodium  100 mg Oral Daily   folic acid  1 mg Oral Daily   metoprolol succinate  50 mg Oral BID   multivitamin with minerals  1 tablet Oral Daily   pantoprazole  40 mg Oral Daily   sacubitril-valsartan  1 tablet Oral BID   spironolactone  25 mg Oral Daily   thiamine  100 mg Oral Daily   Continuous Infusions:  sodium chloride     heparin 1,400 Units/hr (06/18/22 0100)   magnesium sulfate bolus IVPB     PRN Meds: sodium chloride, acetaminophen **OR** acetaminophen, alum & mag hydroxide-simeth, bisacodyl, cyclobenzaprine, guaiFENesin-dextromethorphan, hydrALAZINE, HYDROmorphone (DILAUDID) injection, labetalol, magnesium sulfate bolus IVPB, midazolam, ondansetron, oxyCODONE, phenol, potassium chloride, senna-docusate, sodium phosphate   Vital Signs    Vitals:   06/17/22 1623 06/17/22 1951 06/17/22 2337 06/18/22 0841  BP:  134/84 (!) 149/90 114/73  Pulse: (!) 52 (!) 115 (!) 106 95  Resp:  18 20 18   Temp: 98.7 F (37.1 C) 98.5 F (36.9 C) 98.4 F (36.9 C) 98.8 F (37.1 C)  TempSrc: Oral Oral Oral Oral  SpO2: 100% 99% 100% 97%  Weight:      Height:        Intake/Output Summary (Last 24 hours) at 06/18/2022 0851 Last data filed at 06/18/2022 0100 Gross per 24 hour  Intake 383.96 ml  Output 450 ml  Net -66.04 ml      06/15/2022    3:35 AM 06/14/2022    2:23 PM 06/10/2022   12:03 AM  Last 3 Weights  Weight (lbs) 234 lb 12.6 oz 239 lb 6.7 oz 240 lb  Weight (kg) 106.5 kg 108.6 kg 108.863 kg      Telemetry    Atrial fibrillation rate controlled - Personally Reviewed   Physical Exam   GEN: No acute distress.   Neck: No JVD Cardiac:  irregular Respiratory: Clear to auscultation bilaterally. GI: Soft, nontender, non-distended  MS: No edema; status post bilateral surgery. Neuro:  Nonfocal  Psych: Normal affect   Labs     Chemistry Recent Labs  Lab 06/11/22 1114 06/11/22 2056 06/12/22 0455 06/12/22 1904 06/13/22 0031 06/16/22 0103 06/17/22 0111 06/18/22 0111  NA 133* 131*   < > 132*   < > 131* 130* 129*  K 3.5 3.6   < > 4.0   < > 3.7 3.7 4.2  CL 106 102   < > 103   < > 101 103 98  CO2 21* 21*   < > 22   < > 21* 22 21*  GLUCOSE 127* 127*   < > 189*   < > 99 93 106*  BUN 20 21   < > 21   < > 16 16 15   CREATININE 1.08 1.05   < > 1.19   < > 0.93 0.96 0.99  CALCIUM 8.0* 8.0*   < > 8.2*   < > 8.5* 8.2* 8.5*  MG  --   --   --   --   --  2.0 2.0 1.9  PROT 4.6* 4.2*  --  5.7*  --   --   --   --  ALBUMIN 2.2* 2.1*  --  2.6*  --   --   --   --   AST 133* 120*  --  108*  --   --   --   --   ALT 75* 65*  --  65*  --   --   --   --   ALKPHOS 54 55  --  67  --   --   --   --   BILITOT 1.0 1.9*  --  2.3*  --   --   --   --   GFRNONAA >60 >60   < > >60   < > >60 >60 >60  ANIONGAP 6 8   < > 7   < > 9 5 10    < > = values in this interval not displayed.     Hematology Recent Labs  Lab 06/16/22 0103 06/17/22 0111 06/18/22 0111  WBC 7.0 7.6 9.1  RBC 3.33* 3.15* 3.21*  HGB 10.5* 10.0* 10.0*  HCT 31.3* 29.1* 31.3*  MCV 94.0 92.4 97.5  MCH 31.5 31.7 31.2  MCHC 33.5 34.4 31.9  RDW 21.2* 21.1* 20.4*  PLT 92* 85* 87*     Patient Profile     67 y.o. male with past medical history of paroxysmal atrial fibrillation, prior CVA, hypertension, asthma, tobacco abuse admitted with aortic and right iliac thrombus for evaluation of paroxysmal atrial fibrillation.  Patient underwent aortic embolectomy, bilateral iliofemoral embolectomy, bilateral femoral-popliteal embolectomy and bilateral paracolic compartment fasciotomy.  Ultimately required right groin hematoma surgical intervention.  Developed atrial fibrillation and  cardiology asked to evaluate.  Echocardiogram this admission shows ejection fraction of approximately 40%.  Technically difficult.   Assessment & Plan    1 persistent atrial fibrillation-patient remains in atrial fibrillation this morning.  Heart rate is controlled.  Continue Toprol at present dose. He remains on IV heparin.  Would transition to Xarelto 20 mg daily when okay with surgery.  2 cardiomyopathy-etiology unclear though could be tachycardia mediated.  Plan continue Toprol.  [name] has now been initiated.  Follow blood pressure.  Decrease spironolactone to 12.5 mg daily.  Would plan to repeat echocardiogram in 6 months after heart rate is controlled.  If ejection fraction remains decreased would need ischemia evaluation.   3 status post embolectomy for embolic event-per vascular surgery.   For questions or updates, please contact [place] Please consult [email or web address] for contact info under        Signed, Kirk Ruths, MD  06/18/2022, 8:51 AM

## 2022-06-18 NOTE — Progress Notes (Addendum)
ANTICOAGULATION CONSULT NOTE-Follow Up  Pharmacy Consult for heparin Indication:  Aortic thrombus  Allergies  Allergen Reactions   Cantaloupe (Diagnostic) Other (See Comments)    Patient states it causes his throat to itch   Carrot [Daucus Carota] Other (See Comments)    Patient states it causes his throat to itch   Codeine Nausea And Vomiting    Patient Measurements: Height: 6\' 4"  (193 cm) Weight: 106.5 kg (234 lb 12.6 oz) IBW/kg (Calculated) : 86.8 Heparin Dosing Weight: 108.6kg  Vital Signs: Temp: 98.8 F (37.1 C) (04/01 0841) Temp Source: Oral (04/01 0841) BP: 114/73 (04/01 0841) Pulse Rate: 95 (04/01 0841)  Labs: Recent Labs    06/16/22 0103 06/16/22 0912 06/17/22 0111 06/17/22 1028 06/18/22 0111  HGB 10.5*  --  10.0*  --  10.0*  HCT 31.3*  --  29.1*  --  31.3*  PLT 92*  --  85*  --  87*  HEPARINUNFRC 0.28*   < > 0.38 0.36 0.28*  CREATININE 0.93  --  0.96  --  0.99   < > = values in this interval not displayed.     Estimated Creatinine Clearance: 97 mL/min (by C-G formula based on SCr of 0.99 mg/dL).   Medical History: Past Medical History:  Diagnosis Date   A-fib ([place])    Acute ischemic stroke ([place])    Asthma    Gout    Hypertension    MRSA (methicillin resistant staph aureus) culture positive 2009    Medications:  Medications Prior to Admission  Medication Sig Dispense Refill Last Dose   aspirin 325 MG tablet Take 325 mg by mouth daily as needed for mild pain or headache.   Past Week   ibuprofen (ADVIL) 200 MG tablet Take 600-800 mg by mouth daily as needed for mild pain or headache.   Past Week   Multiple Vitamin (MULTIVITAMIN) tablet Take 1 tablet by mouth daily.   Past Week   albuterol (PROVENTIL HFA;VENTOLIN HFA) 108 (90 Base) MCG/ACT inhaler Inhale 2 puffs into the lungs every 6 (six) hours as needed for wheezing or shortness of breath. (Patient not taking: Reported on 06/12/2022) 1 Inhaler 12 Not Taking   colchicine 0.6 MG tablet Take 1  tablet (0.6 mg total) by mouth daily. (Patient not taking: Reported on 06/12/2022) 30 tablet 2 Not Taking   cyclobenzaprine (FLEXERIL) 10 MG tablet Take 1 tablet (10 mg total) by mouth 3 (three) times daily as needed for muscle spasms. (Patient not taking: Reported on 06/12/2022) 30 tablet 2 Not Taking   lisinopril (PRINIVIL,ZESTRIL) 10 MG tablet Take 1 tablet (10 mg total) by mouth daily. (Patient not taking: Reported on 06/12/2022) 90 tablet 1 Not Taking   metoprolol succinate (TOPROL-XL) 25 MG 24 hr tablet Take 1 tablet (25 mg total) by mouth daily. (Patient not taking: Reported on 06/12/2022) 30 tablet 2 Not Taking   rivaroxaban (XARELTO) 20 MG TABS tablet Take 1 tablet (20 mg total) by mouth daily with supper. (Patient not taking: Reported on 06/12/2022) 30 tablet 2 Not Taking   Scheduled:   aspirin EC  81 mg Oral Daily   atorvastatin  40 mg Oral Daily   docusate sodium  100 mg Oral Daily   folic acid  1 mg Oral Daily   metoprolol succinate  50 mg Oral BID   multivitamin with minerals  1 tablet Oral Daily   pantoprazole  40 mg Oral Daily   sacubitril-valsartan  1 tablet Oral BID   [START ON 06/19/2022]  spironolactone  12.5 mg Oral Daily   thiamine  100 mg Oral Daily   Assessment: 12 yoM with PMH of afib, noncompliant with AC, remote hx CVA, tobacco + marijuana, EtOH who was 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 He underwent aortic, bilateral iliofemoral, femoral-popliteal embolectomy and post surgery had right groin expanding hematoma- drain was placed and anticoag was restarted. Pharmacy consulted to dose heparin.  Heparin level 0.28 units/mL (subtherapeutic) on heparin 1400 units/hr. CBC stable with no signs of bleeding. Plts stable at 87  Goal of Therapy:  Heparin level 0.3-0.7 units/ml   >will aim for 0.3-0.5 given bleed hx  Monitor platelets by anticoagulation protocol: Yes   Plan:  Increase heparin slightly  to 1450 units/hr Check heparin level daily F/u transition to [place] - Heart failure pharmacist has submitted PAP for Eliquis and is assisting with TOC needs  Erskine Speed, PharmD Clinical Pharmacist 4/1/20249:50 AM

## 2022-06-18 NOTE — Care Management Important Message (Signed)
Important Message  Patient Details  Name: TIMOTHY HOSTEN MRN: [id number] Date of Birth: 16-Nov-1955   Medicare Important Message Given:  Yes     [name] 06/18/2022, 12:40 PM

## 2022-06-19 ENCOUNTER — Other Ambulatory Visit (HOSPITAL_COMMUNITY): Payer: Self-pay

## 2022-06-19 ENCOUNTER — Other Ambulatory Visit: Payer: Self-pay | Admitting: Physician Assistant

## 2022-06-19 DIAGNOSIS — I429 Cardiomyopathy, unspecified: Secondary | ICD-10-CM

## 2022-06-19 LAB — CBC
HCT: 32.3 % — ABNORMAL LOW (ref 39.0–52.0)
Hemoglobin: 10.5 g/dL — ABNORMAL LOW (ref 13.0–17.0)
MCH: 31.7 pg (ref 26.0–34.0)
MCHC: 32.5 g/dL (ref 30.0–36.0)
MCV: 97.6 fL (ref 80.0–100.0)
Platelets: 93 10*3/uL — ABNORMAL LOW (ref 150–400)
RBC: 3.31 MIL/uL — ABNORMAL LOW (ref 4.22–5.81)
RDW: 20.3 % — ABNORMAL HIGH (ref 11.5–15.5)
WBC: 10.5 10*3/uL (ref 4.0–10.5)
nRBC: 0 % (ref 0.0–0.2)

## 2022-06-19 LAB — BASIC METABOLIC PANEL
Anion gap: 8 (ref 5–15)
BUN: 17 mg/dL (ref 8–23)
CO2: 20 mmol/L — ABNORMAL LOW (ref 22–32)
Calcium: 8.5 mg/dL — ABNORMAL LOW (ref 8.9–10.3)
Chloride: 102 mmol/L (ref 98–111)
Creatinine, Ser: 0.94 mg/dL (ref 0.61–1.24)
GFR, Estimated: >60 mL/min (ref >60)
Glucose, Bld: 87 mg/dL (ref 70–99)
Potassium: 4.2 mmol/L (ref 3.5–5.1)
Sodium: 130 mmol/L — ABNORMAL LOW (ref 135–145)

## 2022-06-19 LAB — MAGNESIUM: Magnesium: 2 mg/dL (ref 1.7–2.4)

## 2022-06-19 MED ORDER — SPIRONOLACTONE 25 MG PO TABS
12.5000 mg | ORAL_TABLET | Freq: Every day | ORAL | 0 refills | Status: DC
  Filled 2022-06-19: qty 15, 30d supply, fill #0

## 2022-06-19 MED ORDER — ASPIRIN 81 MG PO TBEC
81.0000 mg | DELAYED_RELEASE_TABLET | Freq: Every day | ORAL | 12 refills | Status: DC
  Filled 2022-06-19: qty 30, 30d supply, fill #0

## 2022-06-19 MED ORDER — APIXABAN 5 MG PO TABS
5.0000 mg | ORAL_TABLET | Freq: Two times a day (BID) | ORAL | 6 refills | Status: DC
  Filled 2022-06-19: qty 60, 30d supply, fill #0

## 2022-06-19 MED ORDER — APIXABAN 5 MG PO TABS: 5.0000 mg | ORAL_TABLET | Freq: Two times a day (BID) | ORAL | Status: DC

## 2022-06-19 MED ORDER — METOPROLOL SUCCINATE ER 50 MG PO TB24
50.0000 mg | ORAL_TABLET | Freq: Two times a day (BID) | ORAL | 0 refills | Status: DC
  Filled 2022-06-19: qty 60, 30d supply, fill #0

## 2022-06-19 MED ORDER — ATORVASTATIN CALCIUM 40 MG PO TABS
40.0000 mg | ORAL_TABLET | Freq: Every day | ORAL | 0 refills | Status: DC
  Filled 2022-06-19: qty 30, 30d supply, fill #0

## 2022-06-19 MED ORDER — OXYCODONE HCL 5 MG PO TABS
5.0000 mg | ORAL_TABLET | Freq: Four times a day (QID) | ORAL | 0 refills | Status: DC | PRN
  Filled 2022-06-19: qty 20, 5d supply, fill #0

## 2022-06-19 MED ORDER — SACUBITRIL-VALSARTAN 24-26 MG PO TABS
1.0000 | ORAL_TABLET | Freq: Two times a day (BID) | ORAL | 0 refills | Status: DC
  Filled 2022-06-19: qty 60, 30d supply, fill #0

## 2022-06-19 NOTE — TOC Transition Note (Signed)
Transition of Care (TOC) - CM/SW Discharge Note Marvetta Gibbons RN, BSN Transitions of Care Unit 4E- RN Case Manager See Treatment Team for direct phone #   Patient Details  Name: [name] [name] MRN: [id number] Date of Birth: December 23, 1955  Transition of Care [facility]) CM/SW Contact:  Dawayne Patricia, RN Phone Number: 06/19/2022, 12:31 PM   Clinical Narrative:    Pt stable for transition home today, Orders for [facility] placed. CM spoke with pt and he wants to see how he does at daughter's, will have [facility] f/u with pt for [facility] needs- address confirmed with pt for daughter's home: [address] [place] [address],  Phone # correct for pt- [phone]  Family to transport home later this afternoon.  Pt has confirmed he has [employer], and other DME at home. However PT recommending 3n1 for elevated toilet seat needs. Order placed and pt does not have preference for DME provider- agreeable to use in house provider and have delivered to room prior to discharge.   [facility] to deliver meds prior to  discharge.   S /W  NORMA @ DR. Kathe Becton #  [phone] --FOR APPT:  Follow up made: info placed on AVS TIME : 10:55 AM  DATE : APRIL 22 ,2024  Warren Lacy , PA-C  LOCATION : [place]  [place], [place] [address]  PHONE # [phone]    CM updated [facility] liaison for [facility] needs- per Allene Pyo branch currently does not have OT- and will start with HHPT with note to add OT as staffing able. HH orders modified.   Call made to [facility] liaison for DME need- 3n1 to be delivered to room prior to discharge- [facility] to contact pt regarding any cost.   No further TOC needs noted.      Final next level of care: Home w Home Health Services Barriers to Discharge: Barriers Resolved   Patient Goals and CMS Choice CMS Medicare.gov Compare Post Acute Care list provided to:: Patient Choice offered to / list presented to : Patient  Discharge Placement                    Home w/ [facility]      Discharge Plan and Services Additional resources added to the After Visit Summary for     Discharge Planning Services: CM Consult Post Acute Care Choice: Durable Medical Equipment, Home Health          DME Arranged: 3-N-1 DME Agency: [facility] Date DME Agency Contacted: 06/19/22 Time DME Agency Contacted: [phone] Representative spoke with at DME Agency: Erasmo Downer HH Arranged: PT [place]: [place] Date [place]: 06/18/22 Time [place]: 1500 Representative spoke with at [place]: [place] Determinants of Health ([place]) Interventions SDOH Screenings   Food Insecurity: No Food Insecurity (06/13/2022)  Housing: [place]  (06/13/2022)  Transportation Needs: No Transportation Needs (06/13/2022)  Utilities: Not At Risk (06/15/2022)  Alcohol Screen: Low Risk  (06/13/2022)  Depression (PHQ2-9): Low Risk  (03/07/2018)  Financial Resource Strain: Low Risk  (06/13/2022)  Tobacco Use: High Risk (06/16/2022)     Readmission Risk Interventions    06/19/2022   12:25 PM  Readmission Risk Prevention Plan  Post Dischage Appt Complete  Medication Screening Complete  Transportation Screening Complete

## 2022-06-19 NOTE — Plan of Care (Signed)
  Problem: Education: Goal: Knowledge of prescribed regimen will improve Outcome: Adequate for Discharge   Problem: Activity: Goal: Ability to tolerate increased activity will improve Outcome: Adequate for Discharge   Problem: Bowel/Gastric: Goal: Gastrointestinal status for postoperative course will improve Outcome: Adequate for Discharge   Problem: Bowel/Gastric: Goal: Gastrointestinal status for postoperative course will improve Outcome: Adequate for Discharge   Problem: Skin Integrity: Goal: Demonstration of wound healing without infection will improve Outcome: Adequate for Discharge   Problem: Health Behavior/Discharge Planning: Goal: Ability to manage health-related needs will improve Outcome: Adequate for Discharge   Problem: Clinical Measurements: Goal: Ability to maintain clinical measurements within normal limits will improve Outcome: Adequate for Discharge Goal: Will remain free from infection Outcome: Adequate for Discharge Goal: Diagnostic test results will improve Outcome: Adequate for Discharge Goal: Respiratory complications will improve Outcome: Adequate for Discharge Goal: Cardiovascular complication will be avoided Outcome: Adequate for Discharge   Problem: Activity: Goal: Risk for activity intolerance will decrease Outcome: Adequate for Discharge   Problem: Nutrition: Goal: Adequate nutrition will be maintained Outcome: Adequate for Discharge   Problem: Coping: Goal: Level of anxiety will decrease Outcome: Adequate for Discharge   Problem: Elimination: Goal: Will not experience complications related to bowel motility Outcome: Adequate for Discharge Goal: Will not experience complications related to urinary retention Outcome: Adequate for Discharge   Problem: Skin Integrity: Goal: Risk for impaired skin integrity will decrease Outcome: Adequate for Discharge

## 2022-06-19 NOTE — Progress Notes (Signed)
Rounding Note    Patient Name: [name] [name] Date of Encounter: 06/19/2022  [place] Cardiologist: Dr Curt Bears  Subjective   No CP or dyspnea  Inpatient Medications    Scheduled Meds:  apixaban  5 mg Oral BID   aspirin EC  81 mg Oral Daily   atorvastatin  40 mg Oral Daily   docusate sodium  100 mg Oral Daily   folic acid  1 mg Oral Daily   metoprolol succinate  50 mg Oral BID   multivitamin with minerals  1 tablet Oral Daily   pantoprazole  40 mg Oral Daily   sacubitril-valsartan  1 tablet Oral BID   spironolactone  12.5 mg Oral Daily   thiamine  100 mg Oral Daily   Continuous Infusions:  sodium chloride     magnesium sulfate bolus IVPB     PRN Meds: sodium chloride, acetaminophen **OR** acetaminophen, alum & mag hydroxide-simeth, bisacodyl, cyclobenzaprine, guaiFENesin-dextromethorphan, hydrALAZINE, HYDROmorphone (DILAUDID) injection, labetalol, magnesium sulfate bolus IVPB, midazolam, ondansetron, oxyCODONE, phenol, potassium chloride, senna-docusate, sodium phosphate   Vital Signs    Vitals:   06/18/22 1554 06/18/22 2018 06/18/22 2335 06/19/22 0629  BP: 114/86 (!) 140/91 120/73 (!) 126/94  Pulse: 80 91 87 96  Resp: 18 18 18 18   Temp: 98.9 F (37.2 C) 98.5 F (36.9 C) 98.5 F (36.9 C) 98.7 F (37.1 C)  TempSrc: Oral Oral Oral Oral  SpO2: 93% 97% 98% 99%  Weight:      Height:        Intake/Output Summary (Last 24 hours) at 06/19/2022 0949 Last data filed at 06/19/2022 0600 Gross per 24 hour  Intake --  Output 700 ml  Net -700 ml       06/15/2022    3:35 AM 06/14/2022    2:23 PM 06/10/2022   12:03 AM  Last 3 Weights  Weight (lbs) 234 lb 12.6 oz 239 lb 6.7 oz 240 lb  Weight (kg) 106.5 kg 108.6 kg 108.863 kg      Telemetry    Atrial fibrillation rate controlled - Personally Reviewed   Physical Exam   GEN: NAD Neck: Supple Cardiac: irregular Respiratory: CTA GI: Soft, NT/ND MS: No edema; status post bilateral surgery. Neuro:   Grossly intact Psych: Normal affect   Labs     Chemistry Recent Labs  Lab 06/12/22 1904 06/13/22 0031 06/17/22 0111 06/18/22 0111 06/19/22 0413  NA 132*   < > 130* 129* 130*  K 4.0   < > 3.7 4.2 4.2  CL 103   < > 103 98 102  CO2 22   < > 22 21* 20*  GLUCOSE 189*   < > 93 106* 87  BUN 21   < > 16 15 17   CREATININE 1.19   < > 0.96 0.99 0.94  CALCIUM 8.2*   < > 8.2* 8.5* 8.5*  MG  --    < > 2.0 1.9 2.0  PROT 5.7*  --   --   --   --   ALBUMIN 2.6*  --   --   --   --   AST 108*  --   --   --   --   ALT 65*  --   --   --   --   ALKPHOS 67  --   --   --   --   BILITOT 2.3*  --   --   --   --   GFRNONAA >60   < > >  60 >60 >60  ANIONGAP 7   < > 5 10 8    < > = values in this interval not displayed.      Hematology Recent Labs  Lab 06/17/22 0111 06/18/22 0111 06/19/22 0413  WBC 7.6 9.1 10.5  RBC 3.15* 3.21* 3.31*  HGB 10.0* 10.0* 10.5*  HCT 29.1* 31.3* 32.3*  MCV 92.4 97.5 97.6  MCH 31.7 31.2 31.7  MCHC 34.4 31.9 32.5  RDW 21.1* 20.4* 20.3*  PLT 85* 87* 93*      Patient Profile     67 y.o. male with past medical history of paroxysmal atrial fibrillation, prior CVA, hypertension, asthma, tobacco abuse admitted with aortic and right iliac thrombus for evaluation of paroxysmal atrial fibrillation.  Patient underwent aortic embolectomy, bilateral iliofemoral embolectomy, bilateral femoral-popliteal embolectomy and bilateral paracolic compartment fasciotomy.  Ultimately required right groin hematoma surgical intervention.  Developed atrial fibrillation and cardiology asked to evaluate.  Echocardiogram this admission shows ejection fraction of approximately 40%.  Technically difficult.   Assessment & Plan    1 persistent atrial fibrillation-patient remains in atrial fibrillation this morning.  Heart rate is controlled.  Continue Toprol at present dose.  He has been transition to apixaban 5 mg twice daily.  2 cardiomyopathy-etiology unclear though could be tachycardia  mediated.  Plan continue Toprol, Entresto and spironolactone.  Would plan to repeat echocardiogram in 6 months after heart rate is controlled.  If ejection fraction remains decreased would need ischemia evaluation.   3 status post embolectomy for embolic event-per vascular surgery.  Patient can be discharged from a cardiac standpoint.  Will continue present medications at discharge.  Check potassium and renal function in 1 week.  We will arrange follow-up in the office.  Cardiology will sign off.  Please call with questions.  For questions or updates, please contact [place] Please consult [email or web address] for contact info under        Signed, Kirk Ruths, MD  06/19/2022, 9:49 AM

## 2022-06-19 NOTE — Progress Notes (Signed)
Physical Therapy Treatment Patient Details Name: [name] [name] MRN: [id number] DOB: 1955/04/18 Today's Date: 06/19/2022   History of Present Illness 67 yo male admitted 3/23 with numbness and B leg pain. Pt found to have clots in BLEs s/p aortic emoliectomy, B ileofemoral embolectomy, B fem/pop embolectomies, B 4 compartment fasciotomies and a Rt groin expanding hematoma evacuation surgery. Pt with orthostatic hypotension and tachycardia 4/1. PMH: CVA; noncompliance with blood thinners post CVA.    PT Comments    Pt received in supine, agreeable to therapy session with encouragement, emphasis on benefits of mobility, safety with transfers using rollator, and activity pacing/energy conservation. Pt needed >5 mins in bathroom and needed assist for completion of peri-care after toileting due to pain. Pt with possible mild orthostatic symptoms (denies lightheadedness) but quick to fatigue and c/o BLE pain, poor eccentric control to sit after returning from toilet to sit EOB. BP 136/100 (110) supine HR 96 bpm prior to OOB mobility, and BP 113/66 (81) sitting EOB, HR to 162 with short gait trial to toilet, then up to 188 bpm with return gait trial from bathroom to EOB. Knee-high compression socks donned throughout and pt states it is helping to reduce his BLE swelling. Reviewed techniques for activity pacing. Pt continues to benefit from PT services to progress toward functional mobility goals, continue to recommend 3in1 so he can have elevated toilet/shower chair surface given extensive leg surgeries and RW so he can use to better offload BLE pain while standing.   Recommendations for follow up therapy are one component of a multi-disciplinary discharge planning process, led by the attending physician.  Recommendations may be updated based on patient status, additional functional criteria and insurance authorization.  Follow Up Recommendations       Assistance Recommended at Discharge Intermittent  Supervision/Assistance  Patient can return home with the following A little help with walking and/or transfers;A little help with bathing/dressing/bathroom;Assist for transportation;Assistance with cooking/housework   Equipment Recommendations  Rolling walker (2 wheels);BSC/3in1    Recommendations for Other Services  (pt refusing AIR)     Precautions / Restrictions Precautions Precautions: Fall Precaution Comments: VAC Rt groin, watch HR tachy with all mobility Required Braces or Orthoses:  (knee-high TED hose) Restrictions Weight Bearing Restrictions: Yes RLE Weight Bearing: Weight bearing as tolerated LLE Weight Bearing: Weight bearing as tolerated     Mobility  Bed Mobility Overal bed mobility: Needs Assistance Bed Mobility: Sit to Supine, Supine to Sit     Supine to sit: Supervision Sit to supine: Supervision   General bed mobility comments: cues to use gait belt as R leg lifter, pt then Supervision level    Transfers Overall transfer level: Needs assistance Equipment used: Rolling walker (2 wheels) Transfers: Sit to/from Stand Sit to Stand: Min guard           General transfer comment: from EOB<>Rollator and Rollator<>elevated BSC, pt needs reminders to use to brakes    Ambulation/Gait Ambulation/Gait assistance: Min guard, Supervision Gait Distance (Feet): 20 Feet (x2 with seated break) Assistive device: Rollator (4 wheels) Gait Pattern/deviations: Step-through pattern, Decreased stride length, Wide base of support Gait velocity: slowed     General Gait Details: cues for proximity to AD and use of BUE to offload BLE pain, pt tending to flex trunk forward due to pain, but states when standing straighter/closer to device, he agrees that pain is less. Max HR 188 bpm with ambulation. Mostly HR 115-130 seated in bathroom and 130-160 bpm wtih short gait trial. No  lightheadedness reported, only burning leg pain,      Balance Overall balance assessment: Needs  assistance Sitting-balance support: Feet supported Sitting balance-Leahy Scale: Good     Standing balance support: Bilateral upper extremity supported, Reliant on assistive device for balance, During functional activity Standing balance-Leahy Scale: Fair Standing balance comment: 4WW standing due to BLE pain                            Cognition Arousal/Alertness: Awake/alert Behavior During Therapy: WFL for tasks assessed/performed Overall Cognitive Status: Within Functional Limits for tasks assessed                                 General Comments: Some insight to deficits, pt frequently asking PTA questions related to nursing care/pain issues, RN/MD notified of his concerns (L neck pain and L gum/tooth pain). Mildly decreased safety.        Exercises      General Comments General comments (skin integrity, edema, etc.): BP 136/100 supine prior to mobility HR 96 bpm and BP 113/66 (81) seated post-exertion. HR 150-160's with initial gait trial, then as high as 188 bpm with return trial from bathroom>EOB, improves to 120-130's with seated/supine rest break. SpO2 WFL on RA      Pertinent Vitals/Pain Pain Assessment Pain Assessment: Faces Faces Pain Scale: Hurts even more Pain Location: BLE and throat (L side, pt reports L dental type pain in mouth as well) Pain Descriptors / Indicators: Grimacing, Sore, Discomfort Pain Intervention(s): Monitored during session, Repositioned, Limited activity within patient's tolerance, Premedicated before session     PT Goals (current goals can now be found in the care plan section) Acute Rehab PT Goals Patient Stated Goal: get back to walking and to see live music PT Goal Formulation: With patient Time For Goal Achievement: 06/27/22 Progress towards PT goals: Progressing toward goals    Frequency    Min 1X/week      PT Plan Current plan remains appropriate       AM-PAC PT "6 Clicks" Mobility   Outcome  Measure  Help needed turning from your back to your side while in a flat bed without using bedrails?: None Help needed moving from lying on your back to sitting on the side of a flat bed without using bedrails?: A Little Help needed moving to and from a bed to a chair (including a wheelchair)?: A Little Help needed standing up from a chair using your arms (e.g., wheelchair or bedside chair)?: A Little Help needed to walk in hospital room?: A Little Help needed climbing 3-5 steps with a railing? : Total 6 Click Score: 17    End of Session Equipment Utilized During Treatment: Gait belt Activity Tolerance: Treatment limited secondary to medical complications (Comment);Other (comment) (pain, tachycardia) Patient left: in bed;with call bell/phone within reach;with bed alarm set;Other (comment) (BLE elevated with heels floated) Nurse Communication: Mobility status;Other (comment) (pt c/o L neck/tooth pain and still tachy) PT Visit Diagnosis: Unsteadiness on feet (R26.81);Other abnormalities of gait and mobility (R26.89);Muscle weakness (generalized) (M62.81);Difficulty in walking, not elsewhere classified (R26.2);Pain Pain - Right/Left:  (bil) Pain - part of body: Leg     Time: [id number] PT Time Calculation (min) (ACUTE ONLY): 43 min  Charges:  $Gait Training: 8-22 mins $Therapeutic Activity: 8-22 mins  Houston Siren., PTA Acute Rehabilitation Services Secure Chat Preferred 9a-5:30pm Office: [place] 06/19/2022, 2:53 PM

## 2022-06-19 NOTE — Progress Notes (Signed)
   Heart Failure Stewardship Pharmacist Progress Note   PCP: Azzie Glatter, FNP PCP-Cardiologist: Kirk Ruths, MD    HPI:  67 yo M with PMH of afib, CVA, HTN, asthma, and gout.   Presented to the ED on 3/24 with bilateral leg pain and numbness of both lower legs. 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 L common femoral also has thrombus. Vascular consulted, taken to OR for urgent revascularization.    ECHO done on 3/25 and LVEF is 40%. Last ECHO from 02/2018 EF was 45-50%.  Discharge HF Medications: Beta Blocker: metoprolol succinate 50 mg BID ACE/ARB/ARNI: Entresto 24/26 mg BID MRA: spironolactone 12.5 mg daily   Prior to admission HF Medications: Beta blocker: metoprolol succinate 25 mg once daily ACE/ARB/ARNI: lisinopril 10 mg once daily  Pertinent Lab Values: Serum creatinine 0.94, BUN 17, Potassium 4.2, Sodium 130, Magnesium 2.0  Vital Signs: Weight: 234 lbs (admission weight: 240 lbs) Blood pressure: 120/90s Heart rate: 80-90s  I/O: -0.3L yesterday; net +2.2L  Does the patient have prescription insurance?  No - only has Medicare A/B   Does the patient qualify for medication assistance through manufacturers or grants?   Yes Eligible grants and/or patient assistance programs: Eliquis, Ferne Coe Medication assistance applications in progress: Eliquis, Entresto Medication assistance applications approved: none Approved medication assistance renewals will be completed by: Vascular   Outpatient Pharmacy:  Prior to admission outpatient pharmacy: [facility] Is the patient willing to use [facility] at discharge? Yes Is the patient willing to transition their outpatient pharmacy to utilize a [facility] outpatient pharmacy?   Yes - transitioned to [facility] OP with mail order  Assessment: 1. Acute systolic CHF (LVEF [id number]). NYHA class II symptoms.  - Not volume overloaded on exam, no indication for diuretics. Strict I/Os and  daily weights. Keep K>4 and Mg>2. - Continue metoprolol XL 50 mg BID - Continue Entresto 24/26 mg BID - Continue spironolactone 12.5 mg daily - Consider SGLT2i at follow up - Jardiance preferred with no prescription insurance. Can qualify for patient assistance.    Plan: 1) Medication changes recommended at this time: - Start Jardiance 10 mg daily at follow up  2) Patient assistance: - No prescription insurance - Eliquis and Entresto patient assistance initiated. Can qualify for Jardiance patient assistance if this is added.  - Pharmacy transitioned to [facility] OP with mail order   3)  Education  - Patient has been educated on current HF medications and potential additions to HF medication regimen - Patient verbalizes understanding that over the next few months, these medication doses may change and more medications may be added to optimize HF regimen - Patient has been educated on basic disease state pathophysiology and goals of therapy  Kerby Nora, PharmD, BCPS Heart Failure Stewardship Pharmacist Phone [phone]

## 2022-06-19 NOTE — Progress Notes (Signed)
  Progress Note    06/19/2022 8:19 AM 7 Days Post-Op  Subjective:  dizziness with standing yesterday but feeling better this morning   Vitals:   06/18/22 2335 06/19/22 0629  BP: 120/73 (!) 126/94  Pulse: 87 96  Resp: 18 18  Temp: 98.5 F (36.9 C) 98.7 F (37.1 C)  SpO2: 98% 99%   Physical Exam: Lungs:  non labored Incisions:  redness around staples and sutures of BLE fasciotomies; groins healing well Extremities:  brisk PT signals by doppler Neurologic: a&O  CBC    Component Value Date/Time   WBC 10.5 06/19/2022 0413   RBC 3.31 (L) 06/19/2022 0413   HGB 10.5 (L) 06/19/2022 0413   HCT 32.3 (L) 06/19/2022 0413   PLT 93 (L) 06/19/2022 0413   MCV 97.6 06/19/2022 0413   MCH 31.7 06/19/2022 0413   MCHC 32.5 06/19/2022 0413   RDW 20.3 (H) 06/19/2022 0413   LYMPHSABS 3.1 06/10/2022 0005   MONOABS 0.9 06/10/2022 0005   EOSABS 0.3 06/10/2022 0005   BASOSABS 0.1 06/10/2022 0005    BMET    Component Value Date/Time   NA 130 (L) 06/19/2022 0413   K 4.2 06/19/2022 0413   CL 102 06/19/2022 0413   CO2 20 (L) 06/19/2022 0413   GLUCOSE 87 06/19/2022 0413   BUN 17 06/19/2022 0413   CREATININE 0.94 06/19/2022 0413   CALCIUM 8.5 (L) 06/19/2022 0413   GFRNONAA >60 06/19/2022 0413   GFRAA >60 02/21/2018 0641    INR    Component Value Date/Time   INR 1.3 (H) 06/10/2022 0005     Intake/Output Summary (Last 24 hours) at 06/19/2022 [id number] Last data filed at 06/19/2022 0600 Gross per 24 hour  Intake --  Output 700 ml  Net -700 ml     Assessment/Plan:  67 y.o. male is s/p acute aortic occlusion 7 Days Post-Op   BLE warm and well perfused Groin incisions are healing well Redness around staples and sutures of fasciotomies;  we discussed wearing TED hose and elevating his legs when resting at home; ok to wash with soap and water and pat dry daily Cardiology recommendations will be reflected on discharge med list and he will follow up as an outpatient in the next few  weeks Our office will arrange follow up in 2 weeks; ok for discharge home this afternoon   Dagoberto Ligas, PA-C [facility] [phone] 06/19/2022 8:19 AM

## 2022-06-19 NOTE — Progress Notes (Signed)
Per Md request have arranged BMET in 1 week and placed appt info on AVS. Pt already has appts arranged with [facility] HF and gen cards outlined on AVS.

## 2022-06-20 NOTE — Discharge Summary (Signed)
Discharge Summary  Patient ID: FELDER CRISCIONE [id number] 66 y.o. 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 Elihu Thelin is a 67 year old male who presented to the emergency department with an acute aortic occlusion.  He underwent aortic, bilateral iliofemoral, femoral popliteal embolectomy and bilateral lower extremity fasciotomies by Dr. Virl Cagey on 06/10/2022.  Heparin was continued postoperatively for embolic protection.  Unfortunately he developed a hematoma of the right groin incision and was brought back to the operating room and underwent right groin exploration with hematoma evacuation and myriad powder placement and wound VAC placement by Dr. Virl Cagey.  He tolerated the procedure well and was admitted to the ICU postoperatively.  He required transfusion of 2 units of platelets and 2 units of packed red blood cells postoperatively.  He remained in the ICU for about 5 days.  He eventually was transferred to stepdown.  It should also be noted that he returned to the operating room on 06/12/2022 for bilateral lower extremity fasciotomy closures.  The remainder of his hospital stay consisted of increasing mobility, pain control, and wound evaluation.  Therapy teams recommended inpatient rehabilitation however the patient refused and preferred discharge home.  Transition of care team was consulted to arrange home health PT/OT.  He was also maintained on IV heparin throughout his hospital stay and eventually  transition to Eliquis at discharge.  He maintained brisk Doppler signals in bilateral lower extremities throughout his hospital stay.  A day prior to discharge an incisional VAC was removed from his right groin incision.  Groin incisions appear to be healing well at the time of discharge.  He will follow-up in office in about 2 weeks.    It should also be noted about 2 days prior to discharge she developed atrial fibrillation with elevated heart rate into the low 200s.  He was evaluated by cardiology and medication recommendations are reflected in his discharge medication list.  He will follow-up with cardiology in the next few weeks for further evaluation.  He was prescribed narcotic pain medication for continued postoperative pain control.  He was discharged home in stable condition.  Consults: cardiology  Treatments: surgery: Aortic, bilateral iliofemoral, femoral to popliteal embolectomy by Dr. Virl Cagey on 06/10/2022 Right groin exploration and hematoma evacuation with myriad powder placement and wound VAC by Dr. Virl Cagey on 06/10/2022 Bilateral lower extremity fasciotomy closure by Dr. Virl Cagey on 06/12/2022   Discharge Exam: See progress note 06/19/22 Vitals:   06/19/22 0629 06/19/22 1100  BP: (!) 126/94 (!) 136/100  Pulse: 96   Resp: 18   Temp: 98.7 F (37.1 C)   SpO2: 99%      Disposition: Discharge disposition: 01-Home or Self Care       Patient Instructions:  Allergies as of 06/19/2022       Reactions   Cantaloupe (diagnostic) Other (See Comments)   Patient states it causes his throat to itch   Carrot [daucus Carota] Other (See Comments)   Patient states it causes his throat to itch   Codeine Nausea And Vomiting        Medication List  STOP taking these medications    aspirin 325 MG tablet Replaced by: Aspirin Low Dose 81 MG tablet   colchicine 0.6 MG tablet   lisinopril 10 MG tablet Commonly known as: ZESTRIL   rivaroxaban 20 MG Tabs tablet Commonly known as:  XARELTO       TAKE these medications    albuterol 108 (90 Base) MCG/ACT inhaler Commonly known as: VENTOLIN HFA Inhale 2 puffs into the lungs every 6 (six) hours as needed for wheezing or shortness of breath.   Aspirin Low Dose 81 MG tablet Generic drug: aspirin EC Take 1 tablet (81 mg total) by mouth daily. Swallow whole. Replaces: aspirin 325 MG tablet   atorvastatin 40 MG tablet Commonly known as: LIPITOR Take 1 tablet (40 mg total) by mouth daily.   cyclobenzaprine 10 MG tablet Commonly known as: FLEXERIL Take 1 tablet (10 mg total) by mouth 3 (three) times daily as needed for muscle spasms.   Eliquis 5 MG Tabs tablet Generic drug: apixaban Take 1 tablet (5 mg total) by mouth 2 (two) times daily.   Entresto 24-26 MG Generic drug: sacubitril-valsartan Take 1 tablet by mouth 2 (two) times daily.   ibuprofen 200 MG tablet Commonly known as: ADVIL Take 600-800 mg by mouth daily as needed for mild pain or headache.   metoprolol succinate 50 MG 24 hr tablet Commonly known as: TOPROL-XL Take 1 tablet (50 mg total) by mouth 2 (two) times daily. Take with or immediately following a meal. What changed:  medication strength how much to take when to take this additional instructions   multivitamin tablet Take 1 tablet by mouth daily.   oxyCODONE 5 MG immediate release tablet Commonly known as: Oxy IR/ROXICODONE Take 1 tablet (5 mg total) by mouth every 6 (six) hours as needed for moderate pain.   spironolactone 25 MG tablet Commonly known as: ALDACTONE Take 1/2 tablet (12.5 mg total) by mouth daily.       Activity: activity as tolerated Diet: regular diet Wound Care: keep wound clean and dry  Follow-up with [facility] in 2 weeks.  Signed: Dagoberto Ligas, PA-C 06/20/2022 4:23 PM [facility] Office: [phone]

## 2022-06-21 ENCOUNTER — Telehealth: Payer: Self-pay | Admitting: Physician Assistant

## 2022-06-21 NOTE — Telephone Encounter (Signed)
-----   Message from Dagoberto Ligas, PA-C sent at 06/21/2022  9:23 AM EDT ----- No studies.  Thank you ----- Message ----- From: Garrel Ridgel Sent: 06/21/2022   8:50 AM EDT To: Dagoberto Ligas, PA-C  Any studies? ----- Message ----- From: Iline Oven Sent: 06/19/2022   8:28 AM EDT To: [facility] Admin Pool; [facility] Charge Pool   2 weeks with PA on [place] day.  PO aortoiliac thrombectomy and fasciotomies Thanks

## 2022-06-26 ENCOUNTER — Ambulatory Visit: Payer: Medicare Other | Attending: Cardiology

## 2022-06-26 DIAGNOSIS — I429 Cardiomyopathy, unspecified: Secondary | ICD-10-CM

## 2022-06-26 LAB — BASIC METABOLIC PANEL
BUN/Creatinine Ratio: 20 (ref 10–24)
BUN: 18 mg/dL (ref 8–27)
CO2: 20 mmol/L (ref 20–29)
Calcium: 9.3 mg/dL (ref 8.6–10.2)
Chloride: 104 mmol/L (ref 96–106)
Creatinine, Ser: 0.91 mg/dL (ref 0.76–1.27)
Glucose: 152 mg/dL — ABNORMAL HIGH (ref 70–99)
Potassium: 4.6 mmol/L (ref 3.5–5.2)
Sodium: 133 mmol/L — ABNORMAL LOW (ref 134–144)
eGFR: 92 mL/min/{1.73_m2} (ref >59)

## 2022-06-27 ENCOUNTER — Encounter: Payer: Self-pay | Admitting: *Deleted

## 2022-06-30 ENCOUNTER — Encounter (HOSPITAL_COMMUNITY): Payer: Self-pay

## 2022-06-30 ENCOUNTER — Emergency Department (HOSPITAL_COMMUNITY): Payer: Medicare Other

## 2022-06-30 ENCOUNTER — Other Ambulatory Visit: Payer: Self-pay | Admitting: Vascular Surgery

## 2022-06-30 ENCOUNTER — Inpatient Hospital Stay (HOSPITAL_COMMUNITY)
Admission: EM | Admit: 2022-06-30 | Discharge: 2022-07-18 | DRG: 862 | Disposition: E | Payer: Medicare Other | Attending: Internal Medicine | Admitting: Internal Medicine

## 2022-06-30 ENCOUNTER — Inpatient Hospital Stay (HOSPITAL_COMMUNITY): Payer: Medicare Other

## 2022-06-30 ENCOUNTER — Other Ambulatory Visit: Payer: Self-pay

## 2022-06-30 DIAGNOSIS — I10 Essential (primary) hypertension: Secondary | ICD-10-CM | POA: Diagnosis present

## 2022-06-30 DIAGNOSIS — I959 Hypotension, unspecified: Secondary | ICD-10-CM | POA: Diagnosis not present

## 2022-06-30 DIAGNOSIS — R6520 Severe sepsis without septic shock: Secondary | ICD-10-CM | POA: Diagnosis not present

## 2022-06-30 DIAGNOSIS — I4901 Ventricular fibrillation: Secondary | ICD-10-CM | POA: Diagnosis not present

## 2022-06-30 DIAGNOSIS — E875 Hyperkalemia: Secondary | ICD-10-CM | POA: Diagnosis present

## 2022-06-30 DIAGNOSIS — J9691 Respiratory failure, unspecified with hypoxia: Secondary | ICD-10-CM | POA: Diagnosis present

## 2022-06-30 DIAGNOSIS — Z1152 Encounter for screening for COVID-19: Secondary | ICD-10-CM | POA: Diagnosis not present

## 2022-06-30 DIAGNOSIS — F17210 Nicotine dependence, cigarettes, uncomplicated: Secondary | ICD-10-CM | POA: Diagnosis present

## 2022-06-30 DIAGNOSIS — N179 Acute kidney failure, unspecified: Secondary | ICD-10-CM | POA: Diagnosis not present

## 2022-06-30 DIAGNOSIS — A4102 Sepsis due to Methicillin resistant Staphylococcus aureus: Secondary | ICD-10-CM | POA: Diagnosis present

## 2022-06-30 DIAGNOSIS — J45909 Unspecified asthma, uncomplicated: Secondary | ICD-10-CM | POA: Diagnosis present

## 2022-06-30 DIAGNOSIS — K7290 Hepatic failure, unspecified without coma: Secondary | ICD-10-CM | POA: Diagnosis present

## 2022-06-30 DIAGNOSIS — E871 Hypo-osmolality and hyponatremia: Secondary | ICD-10-CM

## 2022-06-30 DIAGNOSIS — G9341 Metabolic encephalopathy: Secondary | ICD-10-CM | POA: Diagnosis present

## 2022-06-30 DIAGNOSIS — E872 Acidosis, unspecified: Secondary | ICD-10-CM

## 2022-06-30 DIAGNOSIS — E860 Dehydration: Secondary | ICD-10-CM | POA: Diagnosis present

## 2022-06-30 DIAGNOSIS — Z8249 Family history of ischemic heart disease and other diseases of the circulatory system: Secondary | ICD-10-CM | POA: Diagnosis not present

## 2022-06-30 DIAGNOSIS — Z8673 Personal history of transient ischemic attack (TIA), and cerebral infarction without residual deficits: Secondary | ICD-10-CM

## 2022-06-30 DIAGNOSIS — Z91018 Allergy to other foods: Secondary | ICD-10-CM

## 2022-06-30 DIAGNOSIS — I482 Chronic atrial fibrillation, unspecified: Secondary | ICD-10-CM | POA: Diagnosis present

## 2022-06-30 DIAGNOSIS — Z7901 Long term (current) use of anticoagulants: Secondary | ICD-10-CM

## 2022-06-30 DIAGNOSIS — Z885 Allergy status to narcotic agent status: Secondary | ICD-10-CM

## 2022-06-30 DIAGNOSIS — T8149XA Infection following a procedure, other surgical site, initial encounter: Principal | ICD-10-CM | POA: Diagnosis present

## 2022-06-30 DIAGNOSIS — K7460 Unspecified cirrhosis of liver: Secondary | ICD-10-CM | POA: Diagnosis present

## 2022-06-30 DIAGNOSIS — N170 Acute kidney failure with tubular necrosis: Secondary | ICD-10-CM | POA: Diagnosis present

## 2022-06-30 DIAGNOSIS — L03115 Cellulitis of right lower limb: Secondary | ICD-10-CM | POA: Diagnosis present

## 2022-06-30 DIAGNOSIS — M109 Gout, unspecified: Secondary | ICD-10-CM | POA: Diagnosis present

## 2022-06-30 DIAGNOSIS — I472 Ventricular tachycardia, unspecified: Secondary | ICD-10-CM | POA: Diagnosis not present

## 2022-06-30 DIAGNOSIS — D689 Coagulation defect, unspecified: Secondary | ICD-10-CM | POA: Diagnosis present

## 2022-06-30 DIAGNOSIS — A419 Sepsis, unspecified organism: Secondary | ICD-10-CM | POA: Diagnosis not present

## 2022-06-30 DIAGNOSIS — Z7982 Long term (current) use of aspirin: Secondary | ICD-10-CM

## 2022-06-30 DIAGNOSIS — I462 Cardiac arrest due to underlying cardiac condition: Secondary | ICD-10-CM | POA: Diagnosis present

## 2022-06-30 DIAGNOSIS — Z79899 Other long term (current) drug therapy: Secondary | ICD-10-CM

## 2022-06-30 DIAGNOSIS — R6521 Severe sepsis with septic shock: Secondary | ICD-10-CM | POA: Diagnosis present

## 2022-06-30 LAB — CBC WITH DIFFERENTIAL/PLATELET
Abs Immature Granulocytes: 0.77 10*3/uL — ABNORMAL HIGH (ref 0.00–0.07)
Basophils Absolute: 0 10*3/uL (ref 0.0–0.1)
Basophils Relative: 0 %
Eosinophils Absolute: 0.1 10*3/uL (ref 0.0–0.5)
Eosinophils Relative: 1 %
HCT: 43.3 % (ref 39.0–52.0)
Hemoglobin: 13 g/dL (ref 13.0–17.0)
Immature Granulocytes: 5 %
Lymphocytes Relative: 12 %
Lymphs Abs: 1.9 10*3/uL (ref 0.7–4.0)
MCH: 32.2 pg (ref 26.0–34.0)
MCHC: 30 g/dL (ref 30.0–36.0)
MCV: 107.2 fL — ABNORMAL HIGH (ref 80.0–100.0)
Monocytes Absolute: 1.6 10*3/uL — ABNORMAL HIGH (ref 0.1–1.0)
Monocytes Relative: 11 %
Neutro Abs: 11.1 10*3/uL — ABNORMAL HIGH (ref 1.7–7.7)
Neutrophils Relative %: 71 %
Platelets: 199 10*3/uL (ref 150–400)
RBC: 4.04 MIL/uL — ABNORMAL LOW (ref 4.22–5.81)
RDW: 19.9 % — ABNORMAL HIGH (ref 11.5–15.5)
WBC: 15.4 10*3/uL — ABNORMAL HIGH (ref 4.0–10.5)
nRBC: 0.7 % — ABNORMAL HIGH (ref 0.0–0.2)

## 2022-06-30 LAB — RAPID URINE DRUG SCREEN, HOSP PERFORMED
Amphetamines: NOT DETECTED
Barbiturates: NOT DETECTED
Benzodiazepines: NOT DETECTED
Cocaine: NOT DETECTED
Opiates: POSITIVE — AB
Tetrahydrocannabinol: POSITIVE — AB

## 2022-06-30 LAB — URINALYSIS, ROUTINE W REFLEX MICROSCOPIC
Bacteria, UA: NONE SEEN
Glucose, UA: NEGATIVE mg/dL
Ketones, ur: NEGATIVE mg/dL
Leukocytes,Ua: NEGATIVE
Nitrite: NEGATIVE
Protein, ur: 30 mg/dL — AB
Specific Gravity, Urine: 1.02 (ref 1.005–1.030)
pH: 5 (ref 5.0–8.0)

## 2022-06-30 LAB — POCT I-STAT 7, (LYTES, BLD GAS, ICA,H+H)
Acid-base deficit: 14 mmol/L — ABNORMAL HIGH (ref 0.0–2.0)
Bicarbonate: 9.2 mmol/L — ABNORMAL LOW (ref 20.0–28.0)
Calcium, Ion: 1.02 mmol/L — ABNORMAL LOW (ref 1.15–1.40)
HCT: 32 % — ABNORMAL LOW (ref 39.0–52.0)
Hemoglobin: 10.9 g/dL — ABNORMAL LOW (ref 13.0–17.0)
O2 Saturation: 99 %
Patient temperature: 35.1
Potassium: 5.3 mmol/L — ABNORMAL HIGH (ref 3.5–5.1)
Sodium: 131 mmol/L — ABNORMAL LOW (ref 135–145)
TCO2: 10 mmol/L — ABNORMAL LOW (ref 22–32)
pCO2 arterial: <15 mmHg — CL (ref 32–48)
pH, Arterial: 7.396 (ref 7.35–7.45)
pO2, Arterial: 131 mmHg — ABNORMAL HIGH (ref 83–108)

## 2022-06-30 LAB — BLOOD GAS, ARTERIAL
Acid-base deficit: 20 mmol/L — ABNORMAL HIGH (ref 0.0–2.0)
Bicarbonate: 5.6 mmol/L — ABNORMAL LOW (ref 20.0–28.0)
Drawn by: 59156
O2 Saturation: 100 %
Patient temperature: 35.1
pCO2 arterial: <18 mmHg — CL (ref 32–48)
pH, Arterial: 7.24 — ABNORMAL LOW (ref 7.35–7.45)
pO2, Arterial: 260 mmHg — ABNORMAL HIGH (ref 83–108)

## 2022-06-30 LAB — I-STAT VENOUS BLOOD GAS, ED
Acid-base deficit: 24 mmol/L — ABNORMAL HIGH (ref 0.0–2.0)
Bicarbonate: 6.8 mmol/L — ABNORMAL LOW (ref 20.0–28.0)
Calcium, Ion: 1.09 mmol/L — ABNORMAL LOW (ref 1.15–1.40)
HCT: 44 % (ref 39.0–52.0)
Hemoglobin: 15 g/dL (ref 13.0–17.0)
O2 Saturation: 45 %
Potassium: 5.2 mmol/L — ABNORMAL HIGH (ref 3.5–5.1)
Sodium: 128 mmol/L — ABNORMAL LOW (ref 135–145)
TCO2: 8 mmol/L — ABNORMAL LOW (ref 22–32)
pCO2, Ven: 28.1 mmHg — ABNORMAL LOW (ref 44–60)
pH, Ven: 6.99 — CL (ref 7.25–7.43)
pO2, Ven: 37 mmHg (ref 32–45)

## 2022-06-30 LAB — I-STAT CHEM 8, ED
BUN: 53 mg/dL — ABNORMAL HIGH (ref 8–23)
Calcium, Ion: 1.08 mmol/L — ABNORMAL LOW (ref 1.15–1.40)
Chloride: 103 mmol/L (ref 98–111)
Creatinine, Ser: 4.3 mg/dL — ABNORMAL HIGH (ref 0.61–1.24)
Glucose, Bld: 73 mg/dL (ref 70–99)
HCT: 45 % (ref 39.0–52.0)
Hemoglobin: 15.3 g/dL (ref 13.0–17.0)
Potassium: 5.2 mmol/L — ABNORMAL HIGH (ref 3.5–5.1)
Sodium: 128 mmol/L — ABNORMAL LOW (ref 135–145)
TCO2: 10 mmol/L — ABNORMAL LOW (ref 22–32)

## 2022-06-30 LAB — RESP PANEL BY RT-PCR (RSV, FLU A&B, COVID)  RVPGX2
Influenza A by PCR: NEGATIVE
Influenza B by PCR: NEGATIVE
Resp Syncytial Virus by PCR: NEGATIVE
SARS Coronavirus 2 by RT PCR: NEGATIVE

## 2022-06-30 LAB — COMPREHENSIVE METABOLIC PANEL
ALT: 69 U/L — ABNORMAL HIGH (ref 0–44)
AST: 169 U/L — ABNORMAL HIGH (ref 15–41)
Albumin: 2.6 g/dL — ABNORMAL LOW (ref 3.5–5.0)
Alkaline Phosphatase: 93 U/L (ref 38–126)
BUN: 53 mg/dL — ABNORMAL HIGH (ref 8–23)
CO2: <7 mmol/L — ABNORMAL LOW (ref 22–32)
Calcium: 9.2 mg/dL (ref 8.9–10.3)
Chloride: 96 mmol/L — ABNORMAL LOW (ref 98–111)
Creatinine, Ser: 4.04 mg/dL — ABNORMAL HIGH (ref 0.61–1.24)
GFR, Estimated: 15 mL/min — ABNORMAL LOW (ref >60)
Glucose, Bld: 77 mg/dL (ref 70–99)
Potassium: 5.4 mmol/L — ABNORMAL HIGH (ref 3.5–5.1)
Sodium: 130 mmol/L — ABNORMAL LOW (ref 135–145)
Total Bilirubin: 2.9 mg/dL — ABNORMAL HIGH (ref 0.3–1.2)
Total Protein: 6.8 g/dL (ref 6.5–8.1)

## 2022-06-30 LAB — LIPASE, BLOOD: Lipase: 23 U/L (ref 11–51)

## 2022-06-30 LAB — PROTIME-INR
INR: 3.1 — ABNORMAL HIGH (ref 0.8–1.2)
Prothrombin Time: 31.4 seconds — ABNORMAL HIGH (ref 11.4–15.2)

## 2022-06-30 LAB — GLUCOSE, CAPILLARY
Glucose-Capillary: 43 mg/dL — CL (ref 70–99)
Glucose-Capillary: 90 mg/dL (ref 70–99)
Glucose-Capillary: 123 mg/dL — ABNORMAL HIGH (ref 70–99)

## 2022-06-30 LAB — MRSA NEXT GEN BY PCR, NASAL: MRSA by PCR Next Gen: NOT DETECTED

## 2022-06-30 LAB — APTT
aPTT: 33 seconds (ref 24–36)
aPTT: 34 seconds (ref 24–36)

## 2022-06-30 LAB — HEPARIN LEVEL (UNFRACTIONATED): Heparin Unfractionated: >1.1 IU/mL — ABNORMAL HIGH (ref 0.30–0.70)

## 2022-06-30 LAB — LACTIC ACID, PLASMA
Lactic Acid, Venous: >9 mmol/L (ref 0.5–1.9)
Lactic Acid, Venous: >9 mmol/L (ref 0.5–1.9)

## 2022-06-30 LAB — AMMONIA: Ammonia: 69 umol/L — ABNORMAL HIGH (ref 9–35)

## 2022-06-30 LAB — HIV ANTIBODY (ROUTINE TESTING W REFLEX): HIV Screen 4th Generation wRfx: NONREACTIVE

## 2022-06-30 MED ORDER — DEXTROSE 50 % IV SOLN
25.0000 g | INTRAVENOUS | Status: AC
  Administered 2022-06-30: 25 g via INTRAVENOUS

## 2022-06-30 MED ORDER — VANCOMYCIN HCL 2000 MG/400ML IV SOLN
2000.0000 mg | Freq: Once | INTRAVENOUS | Status: AC
  Administered 2022-06-30: 2000 mg via INTRAVENOUS
  Filled 2022-06-30: qty 400

## 2022-06-30 MED ORDER — SODIUM BICARBONATE 8.4 % IV SOLN
INTRAVENOUS | Status: AC
  Filled 2022-06-30: qty 100

## 2022-06-30 MED ORDER — POLYETHYLENE GLYCOL 3350 17 G PO PACK: 17.0000 g | PACK | Freq: Every day | ORAL | Status: DC | PRN

## 2022-06-30 MED ORDER — ACETAMINOPHEN 10 MG/ML IV SOLN: 1000.0000 mg | Freq: Four times a day (QID) | INTRAVENOUS | Status: DC

## 2022-06-30 MED ORDER — ACETAMINOPHEN 10 MG/ML IV SOLN
500.0000 mg | Freq: Four times a day (QID) | INTRAVENOUS | Status: DC
  Administered 2022-06-30: 500 mg via INTRAVENOUS
  Filled 2022-06-30: qty 50

## 2022-06-30 MED ORDER — SODIUM BICARBONATE 8.4 % IV SOLN
INTRAVENOUS | Status: AC
  Filled 2022-06-30: qty 50

## 2022-06-30 MED ORDER — NOREPINEPHRINE 4 MG/250ML-% IV SOLN
2.0000 ug/min | INTRAVENOUS | Status: DC
  Administered 2022-06-30: 4 ug/min via INTRAVENOUS
  Filled 2022-06-30: qty 250

## 2022-06-30 MED ORDER — SODIUM BICARBONATE 8.4 % IV SOLN
50.0000 meq | Freq: Once | INTRAVENOUS | Status: AC
  Administered 2022-06-30: 50 meq via INTRAVENOUS

## 2022-06-30 MED ORDER — METRONIDAZOLE 500 MG/100ML IV SOLN
500.0000 mg | Freq: Once | INTRAVENOUS | Status: AC
  Administered 2022-06-30: 500 mg via INTRAVENOUS
  Filled 2022-06-30: qty 100

## 2022-06-30 MED ORDER — SODIUM BICARBONATE 8.4 % IV SOLN
INTRAVENOUS | Status: DC
  Filled 2022-06-30 (×2): qty 1000

## 2022-06-30 MED ORDER — SODIUM CHLORIDE 0.9 % IV BOLUS
1000.0000 mL | Freq: Once | INTRAVENOUS | Status: AC
  Administered 2022-06-30: 1000 mL via INTRAVENOUS

## 2022-06-30 MED ORDER — VANCOMYCIN HCL IN DEXTROSE 1-5 GM/200ML-% IV SOLN: 1000.0000 mg | Freq: Once | INTRAVENOUS | Status: DC

## 2022-06-30 MED ORDER — HEPARIN (PORCINE) 25000 UT/250ML-% IV SOLN
1900.0000 [IU]/h | INTRAVENOUS | Status: DC
  Administered 2022-06-30: 1900 [IU]/h via INTRAVENOUS
  Filled 2022-06-30: qty 250

## 2022-06-30 MED ORDER — SODIUM CHLORIDE 0.9 % IV SOLN
2.0000 g | Freq: Once | INTRAVENOUS | Status: AC
  Administered 2022-06-30: 2 g via INTRAVENOUS
  Filled 2022-06-30: qty 12.5

## 2022-06-30 MED ORDER — SODIUM BICARBONATE 8.4 % IV SOLN
100.0000 meq | Freq: Once | INTRAVENOUS | Status: AC
  Administered 2022-06-30: 100 meq via INTRAVENOUS
  Filled 2022-06-30: qty 50

## 2022-06-30 MED ORDER — LACTULOSE ENEMA
300.0000 mL | Freq: Once | ORAL | Status: AC
  Administered 2022-07-01: 300 mL via RECTAL
  Filled 2022-06-30: qty 300

## 2022-06-30 MED ORDER — SODIUM CHLORIDE 0.9 % IV BOLUS: 500.0000 mL | Freq: Once | INTRAVENOUS | Status: DC

## 2022-06-30 MED ORDER — VANCOMYCIN VARIABLE DOSE PER UNSTABLE RENAL FUNCTION (PHARMACIST DOSING): Status: DC

## 2022-06-30 MED ORDER — DOCUSATE SODIUM 100 MG PO CAPS: 100.0000 mg | ORAL_CAPSULE | Freq: Two times a day (BID) | ORAL | Status: DC | PRN

## 2022-06-30 MED ORDER — LACTATED RINGERS IV SOLN: INTRAVENOUS | Status: DC

## 2022-06-30 MED ORDER — SODIUM CHLORIDE 0.9 % IV SOLN
250.0000 mL | INTRAVENOUS | Status: DC
  Administered 2022-06-30: 250 mL via INTRAVENOUS

## 2022-06-30 MED ORDER — SODIUM CHLORIDE 0.9 % IV SOLN: 2.0000 g | INTRAVENOUS | Status: DC

## 2022-06-30 MED ORDER — OXYCODONE HCL 5 MG PO TABS: 5.0000 mg | ORAL_TABLET | Freq: Four times a day (QID) | ORAL | 0 refills | Status: DC | PRN

## 2022-06-30 MED ORDER — IOHEXOL 350 MG/ML SOLN
100.0000 mL | Freq: Once | INTRAVENOUS | Status: AC | PRN
  Administered 2022-06-30: 100 mL via INTRAVENOUS

## 2022-06-30 MED ORDER — DEXTROSE 50 % IV SOLN
INTRAVENOUS | Status: AC
  Filled 2022-06-30: qty 50

## 2022-06-30 NOTE — ED Notes (Signed)
EDP at [facility] upon EMS arrival

## 2022-06-30 NOTE — ED Triage Notes (Signed)
Patient arrived by EMS with complain of bilateral leg pain, weakness and testicle pain. Patient restless, pale and diaphoretic on arrival. Arrived on NR mask

## 2022-06-30 NOTE — Progress Notes (Signed)
ANTICOAGULATION CONSULT NOTE - Initial Consult  Pharmacy Consult for Heparin Indication:  VTE  Allergies  Allergen Reactions   Cantaloupe (Diagnostic) Other (See Comments)    Patient states it causes his throat to itch   Carrot [Daucus Carota] Other (See Comments)    Patient states it causes his throat to itch   Codeine Nausea And Vomiting    Patient Measurements: Height: 6\' 4"  (193 cm) Weight: 106.6 kg (235 lb) IBW/kg (Calculated) : 86.8  Vital Signs: Temp: 95.2 F (35.1 C) (04/13 1647) Temp Source: Rectal (04/13 1647) BP: 113/51 (04/13 1640) Pulse Rate: 39 (04/13 1640)  Labs: Recent Labs    07/06/2022 1614 07/16/2022 1621  HGB 15.0  15.3 13.0  HCT 44.0  45.0 43.3  PLT  --  199  APTT  --  33  LABPROT  --  31.4*  INR  --  3.1*  HEPARINUNFRC  --  >1.10*  CREATININE 4.30*  --     Estimated Creatinine Clearance: 22.3 mL/min (A) (by C-G formula based on SCr of 4.3 mg/dL (H)).   Medical History: Past Medical History:  Diagnosis Date   A-fib    Acute ischemic stroke    Asthma    Gout    Hypertension    MRSA (methicillin resistant staph aureus) culture positive 2009    Medications:  (Not in a hospital admission)  Scheduled:  Infusions:   sodium chloride     heparin Stopped (07/13/2022 1708)   lactated ringers 999 mL/hr at 06/20/2022 1629   metronidazole     norepinephrine (LEVOPHED) Adult infusion     vancomycin 2,000 mg (07/07/2022 1650)   PRN:   Assessment: 66 yom with a history of AF, CVA. 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 Heparin per pharmacy consult placed for  VTE .  Patient is on apixaban prior to arrival. Last dose unknown but baseline heparin level is >1.1 suggesting compliance with apixaban. Will require aPTT monitoring due to likely falsely high anti-Xa level secondary to DOAC use.  Hgb 15.3 PT/INR 31.4/3.1 HL >1.1; aPTT 33  Goal of Therapy:  Heparin level 0.3-0.7 units/ml aPTT 66-102 seconds Monitor  platelets by anticoagulation protocol: Yes   Plan:  No initial heparin bolus Start heparin infusion at 1900 units/hr on arrival to ED under presumed PE treatment awaiting CT imaging. After return from CT EDP wanting to stop heparin for now. Vascular at bedside. Pharmacy available for further dosing assistance if continued  Delmar Landau, PharmD, BCPS 07/15/2022 5:09 PM ED Clinical Pharmacist -  [phone]

## 2022-06-30 NOTE — Progress Notes (Signed)
Rt attempted ABG x2. RT called another RT to attempt but pt left to go to CT.

## 2022-06-30 NOTE — ED Notes (Signed)
Patient transported to CT now with 2 RNs and RT, code medical

## 2022-06-30 NOTE — Consult Note (Addendum)
Hospital Consult    Reason for Consult: Concern for lower extremity ischemia Requesting Physician: ED MRN #:  [id number]  History of Present Illness: This is a 67 y.o. male well-known to me having undergone aortic, bilateral iliofemoral, femoral-popliteal embolectomy for occluded aorta roughly 3 weeks ago.  Postop course was complicated by return to the OR for bleeding due to anticoagulation with thrombocytopenia. Beyond that, [name] [name] did moderately well, and was discharged home after 4 compartment fasciotomies were closed.  His daughter called me this morning stating [name] [name] was in more pain.  Denied sensorimotor deficits.  Shortly after the phone call, his daughter stated he had slurred speech, numbness and tingling in the left hand.  She called EMS.  Vascular surgery was called due to nonpalpable pulses in the lower extremities upon arrival.  On exam, [name] [name] appeared hypoxic, and was placed on a nonrebreather.  I assisted the emergency medicine team and placed an arterial line as pressures were in the 80s.  [name] [name] was able to follow commands, but continued to complain of left arm numbness and tingling, specifically at the fingertips.  He denied sensorimotor deficits in the feet.  Denied fevers and chills at home.  Denied abdominal pain.  Per [name] and his daughter, he has been compliant with his Eliquis.  Past Medical History:  Diagnosis Date   A-fib    Acute ischemic stroke    Asthma    Gout    Hypertension    MRSA (methicillin resistant staph aureus) culture positive 2009    Past Surgical History:  Procedure Laterality Date   AORTOGRAM N/A 06/10/2022   Procedure: AORTOGRAM;  Surgeon: Victorino Sparrow, MD;  Location: [facility] OR;  Service: Vascular;  Laterality: N/A;   APPLICATION OF WOUND VAC Right 06/10/2022   Procedure: APPLICATION OF WOUND VAC WITH MYRIAD MORCELLS;  Surgeon: Victorino Sparrow, MD;  Location: [facility] OR;  Service: Vascular;  Laterality: Right;   EMBOLECTOMY  06/10/2022    Procedure: SUPERFICIAL FEMORAL ARTERY-POPITEAL EMBOLECTOMY;  Surgeon: Victorino Sparrow, MD;  Location: [facility] OR;  Service: Vascular;;   FASCIOTOMY Bilateral 06/10/2022   Procedure: BILATERAL FOUR COMPARTMENT FASCIOTOMY;  Surgeon: Victorino Sparrow, MD;  Location: [facility] OR;  Service: Vascular;  Laterality: Bilateral;   FASCIOTOMY CLOSURE Bilateral 06/12/2022   Procedure: BILATERAL LOWER EXTREMITY FASCIOTOMY CLOSURE;  Surgeon: Victorino Sparrow, MD;  Location: [facility] OR;  Service: Vascular;  Laterality: Bilateral;   HEMATOMA EVACUATION Right 06/10/2022   Procedure: EVACUATION LEFT GROIN  HEMATOMA;  Surgeon: Victorino Sparrow, MD;  Location: [facility] OR;  Service: Vascular;  Laterality: Right;   LOWER EXTREMITY ANGIOGRAM Bilateral 06/10/2022   Procedure: BILATERAL LOWER EXTREMITY ANGIOGRAM;  Surgeon: Victorino Sparrow, MD;  Location: [facility] OR;  Service: Vascular;  Laterality: Bilateral;   THROMBECTOMY FEMORAL ARTERY Bilateral 06/10/2022   Procedure: THROMBECTOMY BILATERAL AOTRA-ILIO-FEMORAL ARTERY;  Surgeon: Victorino Sparrow, MD;  Location: [facility] OR;  Service: Vascular;  Laterality: Bilateral;    Allergies  Allergen Reactions   Cantaloupe (Diagnostic) Other (See Comments)    Patient states it causes his throat to itch   Carrot [Daucus Carota] Other (See Comments)    Patient states it causes his throat to itch   Codeine Nausea And Vomiting    Prior to Admission medications   Medication Sig Start Date End Date Taking? Authorizing Provider  albuterol (PROVENTIL HFA;VENTOLIN HFA) 108 (90 Base) MCG/ACT inhaler Inhale 2 puffs into the lungs every 6 (six) hours as needed for wheezing or shortness of breath. Patient not  taking: Reported on 06/12/2022 04/07/18   Kallie Locks, FNP  apixaban (ELIQUIS) 5 MG TABS tablet Take 1 tablet (5 mg total) by mouth 2 (two) times daily. 06/19/22   Emilie Rutter, PA-C  aspirin EC 81 MG tablet Take 1 tablet (81 mg total) by mouth daily. Swallow whole. 06/20/22   Victorino Sparrow, MD   atorvastatin (LIPITOR) 40 MG tablet Take 1 tablet (40 mg total) by mouth daily. 06/19/22 09/17/22  Emilie Rutter, PA-C  cyclobenzaprine (FLEXERIL) 10 MG tablet Take 1 tablet (10 mg total) by mouth 3 (three) times daily as needed for muscle spasms. Patient not taking: Reported on 06/12/2022 04/07/18   Kallie Locks, FNP  ibuprofen (ADVIL) 200 MG tablet Take 600-800 mg by mouth daily as needed for mild pain or headache.    [provider]  metoprolol succinate (TOPROL-XL) 50 MG 24 hr tablet Take 1 tablet (50 mg total) by mouth 2 (two) times daily. Take with or immediately following a meal. 06/19/22   Emilie Rutter, PA-C  Multiple Vitamin (MULTIVITAMIN) tablet Take 1 tablet by mouth daily.    [provider]  oxyCODONE (OXY IR/ROXICODONE) 5 MG immediate release tablet Take 1 tablet (5 mg total) by mouth every 6 (six) hours as needed for moderate pain. 07-09-22   Victorino Sparrow, MD  sacubitril-valsartan (ENTRESTO) 24-26 MG Take 1 tablet by mouth 2 (two) times daily. 06/19/22   Emilie Rutter, PA-C  spironolactone (ALDACTONE) 25 MG tablet Take 1/2 tablet (12.5 mg total) by mouth daily. 06/19/22   Emilie Rutter, PA-C    Social History   Socioeconomic History   Marital status: Married    Spouse name: Not on file   Number of children: 3   Years of education: Not on file   Highest education level: 12th grade  Occupational History   Occupation: Retired  Tobacco Use   Smoking status: Some Days    Packs/day: 0.50    Years: 53.00    Additional pack years: 0.00    Total pack years: 26.50    Types: Cigarettes   Smokeless tobacco: Never  Vaping Use   Vaping Use: Never used  Substance and Sexual Activity   Alcohol use: Yes    Comment: 2-3 times per week   Drug use: Not Currently   Sexual activity: Not on file  Other Topics Concern   Not on file  Social History Narrative   Not on file   Social Determinants of Health   Financial Resource Strain: Low Risk  (06/13/2022)    Overall Financial Resource Strain (CARDIA)    Difficulty of Paying Living Expenses: Not very hard  Food Insecurity: No Food Insecurity (06/13/2022)   Hunger Vital Sign    Worried About Running Out of Food in the Last Year: Never true    Ran Out of Food in the Last Year: Never true  Transportation Needs: No Transportation Needs (06/13/2022)   PRAPARE - Administrator, Civil Service (Medical): No    Lack of Transportation (Non-Medical): No  Physical Activity: Not on file  Stress: Not on file  Social Connections: Not on file  Intimate Partner Violence: Not At Risk (06/15/2022)   Humiliation, Afraid, Rape, and Kick questionnaire    Fear of Current or Ex-Partner: No    Emotionally Abused: No    Physically Abused: No    Sexually Abused: No   Family History  Problem Relation Age of Onset   Heart disease Mother    Hypertension  Father     ROS: Otherwise negative unless mentioned in HPI  Physical Examination  Vitals:   07/07/2022 1915 06/22/2022 1956  BP:    Pulse:    Resp: (!) 29   Temp:  (!) 94.8 F (34.9 C)  SpO2:     Body mass index is 28.61 kg/m.  General: Acute distress Gait: Not observed HENT: Nonrebreather Pulmonary: Tachypneic, nonrebreather Cardiac: Atrial fibrillation, rate in the 130s Abdomen:  soft, NT/ND, no masses Skin: without rashes Vascular Exam/Pulses: Triphasic signals in the feet-PT Extremities: without ischemic changes, without Gangrene , with cellulitis; with open wounds;  There is medial and lateral fasciotomy sites in the right leg appear to be infected.  There is fluid overload with significant edema.  No ascending cellulitis, no concern for limb threatening infection Musculoskeletal: no muscle wasting or atrophy  Neurologic: A&O X 3;  No focal weakness or paresthesias are detected; speech is fluent/normal Psychiatric:  The pt has Normal affect. Lymph:  Unremarkable  CBC    Component Value Date/Time   WBC 15.4 (H) 07/03/2022 1621    RBC 4.04 (L) 06/21/2022 1621   HGB 13.0 06/29/2022 1621   HCT 43.3 07/14/2022 1621   PLT 199 06/21/2022 1621   MCV 107.2 (H) 07/03/2022 1621   MCH 32.2 07/09/2022 1621   MCHC 30.0 06/19/2022 1621   RDW 19.9 (H) 07/09/2022 1621   LYMPHSABS 1.9 07/16/2022 1621   MONOABS 1.6 (H) 07/11/2022 1621   EOSABS 0.1 06/24/2022 1621   BASOSABS 0.0 06/20/2022 1621    BMET    Component Value Date/Time   NA 130 (L) 06/23/2022 1621   NA 133 (L) 06/26/2022 1344   K 5.4 (H) 06/23/2022 1621   CL 96 (L) 06/23/2022 1621   CO2 <7 (L) 06/19/2022 1621   GLUCOSE 77 07/13/2022 1621   BUN 53 (H) 06/19/2022 1621   BUN 18 06/26/2022 1344   CREATININE 4.04 (H) 06/23/2022 1621   CALCIUM 9.2 06/20/2022 1621   GFRNONAA 15 (L) 07/12/2022 1621   GFRAA >60 02/21/2018 0641    COAGS: Lab Results  Component Value Date   INR 3.1 (H) 07/15/2022   INR 1.3 (H) 06/10/2022    ASSESSMENT/PLAN: This is a 67 y.o. male who presents with altered mental status, hypotension, tachycardia.  Lactate over 9, acute kidney injury 3 weeks status post aortic occlusion repair via bilateral iliofemoral, femoral-popliteal embolectomy.  No concern for limb threatening ischemia.  The right lower extremity fasciotomy sites appear to be infected with cellulitis appreciated at the level of the skin.  There is a small amount of air appreciated in the soft tissue at the medial fasciotomy site on CT. There is no ascending cellulitis, no ascending lymphangitis, no purulence.  There does not appear to be a limb threatening infection.  I would expect a higher white blood cell count in the setting of severe sepsis leading to encephalopathy and acute kidney injury however this seems to be the leading diagnosis explaining his symptoms.  In talking with his daughter, I think there is also a component of failure to thrive at home with severe dehydration, poor p.o. intake, limited mobility. Prior clot appreciated at the time of his original surgery was  deemed to be cardioembolic, meaning he likely has a history of arrhythmia as demonstrated on admission. [facility] symptoms may be even be more pronounced due to his known cirrhosis.  Regardless, Kaegan would benefit from right-sided fasciotomy washout and debridement.     Fasciotomy washout will be performed  once the patient is more stable. Recommend IV abx and continued resuscitation.   I will continue to follow, and appreciate the excellent care from both ED staff and critical care.   Fara Olden MD MS [facility] [phone] 07/04/2022  8:10 PM

## 2022-06-30 NOTE — ED Provider Notes (Signed)
[name] [name] Provider Note   CSN: [id number] Arrival date & time: 07/17/2022  1549     History Chief Complaint  Patient presents with   AMS/pale    HPI [name] [name] is a 67 y.o. male presenting for altered mental status.  Much of the history is collected from the patient's wife/daughter/son who are all at bedside.  They state that he had been doing well in the outpatient setting since his discharge until this morning.  Today he became progressively confused.  He was complaining about groin pain and left right lower extremity pain before he started becoming progressively confused.  They noticed hyperventilation as well and bring him in for further care and management by EMS.  He is coming from home. They endorse medication compliance on all medications.  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   Review of Systems   Review of Systems  Constitutional:  Positive for activity change. Negative for chills and fever.  HENT:  Negative for ear pain and sore throat.   Eyes:  Negative for pain and visual disturbance.  Respiratory:  Negative for cough and shortness of breath.   Cardiovascular:  Negative for chest pain and palpitations.  Gastrointestinal:  Negative for abdominal pain and vomiting.  Genitourinary:  Negative for dysuria and hematuria.  Musculoskeletal:  Negative for arthralgias and back pain.  Skin:  Negative for color change and rash.  Neurological:  Negative for seizures and syncope.  Psychiatric/Behavioral:  Positive for confusion.   All other systems reviewed and are negative.   Physical Exam Updated Vital Signs BP (!) 156/101 (BP  Location: Right Arm)   Pulse 100   Temp (!) 95.2 F (35.1 C) (Bladder)   Resp (!) 38   Ht  (1.93 m)   Wt 92.1 kg   SpO2 (!) 89%   BMI 24.72 kg/m  Physical Exam Vitals and nursing note reviewed.  Constitutional:      General: He is not in acute distress.    Appearance: He is well-developed.  HENT:     Head: Normocephalic and atraumatic.  Eyes:     Conjunctiva/sclera: Conjunctivae normal.  Cardiovascular:     Rate and Rhythm: Tachycardia present. Rhythm irregular.     Heart sounds: No murmur heard. Pulmonary:     Effort: Pulmonary effort is normal. No respiratory distress.     Breath sounds: Rhonchi present.  Abdominal:     Palpations: Abdomen is soft.     Tenderness: There is no abdominal tenderness.  Musculoskeletal:        General: No swelling.     Cervical back: Neck supple.  Skin:    General: Skin is warm and dry.     Capillary Refill: Capillary refill takes less than 2 seconds.     Coloration: Skin is pale. Skin is not jaundiced.  Neurological:     Mental Status: He is alert. He is disoriented.  Psychiatric:        Mood and Affect: Mood normal.      ED Course/ Medical Decision Making/ A&P Clinical Course as of 07/17/2022 2350  Sat 07/17/22  1659 pH, Ven(!!): 6.990 I was emergently called to bedside due to critical nature of patient's illness.  He is presenting with altered mental status today.  Extensive medical history, recent discharge from the hospital after bilateral femoral bypass. 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  Currently on Eliquis.  Right lower extremity is pulseless on pulse-wave Doppler.  Initial studies resulted with pH 6.9 and CO2 that is low.  We are unable to get a oxygen saturation on this patient I suspect because of arterial ischemia which patient has extensive history of. VBG shows venous oxygen saturation of 37. I suspect arterial oxygen saturation is improved from this venous oxygen saturation and I do  not believe that intubation is indicated at this second as patient is high risk for sudden onset cardiac arrest if given medications for RSI. He is tolerating oxygenation by nonrebreather.  [CC]    Clinical Course User Index [CC] Glyn Ade, MD    Procedures Procedures   Medications Ordered in ED Medications  lactated ringers infusion ( Intravenous Paused 07/10/2022 2202)  0.9 %  sodium chloride infusion (250 mLs Intravenous New Bag/Given 07/05/2022 2309)  norepinephrine (LEVOPHED) 4mg  in (0.016 mg/mL) premix infusion (0 mcg/min Intravenous Stopped 06/24/2022 1950)  vancomycin variable dose per unstable renal function (pharmacist dosing) (has no administration in time range)  ceFEPIme (MAXIPIME) 2 g in sodium chloride 0.9 % 100 mL IVPB (has no administration in time range)  docusate sodium (COLACE) capsule 100 mg (has no administration in time range)  polyethylene glycol (MIRALAX / GLYCOLAX) packet 17 g (has no administration in time range)  acetaminophen (OFIRMEV) IV 500 mg (500 mg Intravenous New Bag/Given 07/12/2022 2309)  sodium bicarbonate 150 mEq in dextrose 5 % 1,150 mL infusion ( Intravenous New Bag/Given 06/26/2022 2308)  lactulose (CHRONULAC) enema 200 gm (has no administration in time range)  sodium bicarbonate injection 50 mEq (50 mEq Intravenous Given 07/05/2022 1631)  ceFEPIme (MAXIPIME) 2 g in sodium chloride 0.9 % 100 mL IVPB (0 g Intravenous Stopped 07/16/2022 1650)  metroNIDAZOLE (FLAGYL) IVPB 500 mg (0 mg Intravenous Stopped 06/22/2022 1818)  vancomycin (VANCOREADY) IVPB 2000 mg/400 mL (0 mg Intravenous Stopped 07/12/2022 1852)  iohexol (OMNIPAQUE) 350 MG/ML injection 100 mL (100 mLs Intravenous Contrast Given 07/15/2022 1732)  sodium chloride 0.9 % bolus 1,000 mL (0 mLs Intravenous Stopped 07/03/2022 1802)  sodium bicarbonate injection 100 mEq (100 mEq Intravenous Given 07/09/2022 2014)  dextrose 50 % solution (  Duplicate 07/13/2022 2003)  dextrose 50 % solution 25 g (25 g Intravenous Given  06/24/2022 1947)  sodium bicarbonate 1 mEq/mL injection (  Duplicate 07/07/2022 2027)  sodium bicarbonate injection 50 mEq (50 mEq Intravenous Given 06/29/2022 2136)  sodium bicarbonate 1 mEq/mL injection (  Duplicate 06/18/2022 2137)    Medical Decision Making:   Mr. Bentler is a 66 year old male who is critically ill today.  I was called emergently to bedside for intervention.  Patient is pale dyspneic tachycardic on arrival GCS approximately 10.  Family was at bedside fortunately provide see above history of sudden worsening altered mental status throughout the day today. He was found to be in A-fib with tachycardia to the 160s, fortunately blood pressure is stable.  Oxygen saturations not able to be collected, temperature 95, respirations in the 30s.  He is answering some questions intermittently such as his name. Initial differential was broad including sepsis, recurrent limb ischemia, CVA, ACS, pulmonary embolism, urologic infection.  Broad treatment was started immediately with IV fluids, antibiotics including vancomycin/pip-tazo.  Patient started to become slightly hypotensive with maps in the mid 80s.  A small dose  of Levophed was started for clinical support.  He was on a nonrebreather throughout this evaluation for suspected hypoxia though no SpO2 was able to be collected because of poor Plath and vasculopathy. A VBG was sent off which demonstrated normal venous oxygen which I believe represents normal oxygenation in this patient, see justification in [name] At this time, vascular surgery was consulted because of an inability to get pulse-wave Dopplers on patient's right lower extremity even under ultrasound guidance.  VBG resulted at the same time with acidemia.  An i-STAT was collected the same time that showed new renal failure as well as normal hemoglobin.   Based on this information, patient was taken to CT angiography chest abdomen pelvis with bifemoral runoff for suspected ischemic acidosis as a  working diagnosis.  No obvious lesion was identified, vascular surgery arrived at bedside as patient returned from CT.  Vascular surgery assisted with placement of an arterial line which demonstrated appropriate oxygen saturations. Given hemodynamically stability established with A-line, pressor support and working diagnosis of sepsis from lower extremities, patient was arranged for admission with critical care.  Family was updated multiple times during this evaluation with multiple family meetings.  They would like to continue with full goals of care at this time though they wish to remain updated regarding any further deterioration. Disposition:   Based on the above findings, I believe this patient is stable for admission.    Patient/family educated about specific findings on our evaluation and explained exact reasons for admission.  Patient/family educated about clinical situation and time was allowed to answer questions.   Admission team communicated with and agreed with need for admission. Patient admitted. Patient ready to move at this time.     Emergency Department Medication Summary:   Medications  lactated ringers infusion ( Intravenous Paused 07/15/2022 2202)  0.9 %  sodium chloride infusion (250 mLs Intravenous New Bag/Given 06/19/2022 2309)  norepinephrine (LEVOPHED) 4mg  in (0.016 mg/mL) premix infusion (0 mcg/min Intravenous Stopped 06/18/2022 1950)  vancomycin variable dose per unstable renal function (pharmacist dosing) (has no administration in time range)  ceFEPIme (MAXIPIME) 2 g in sodium chloride 0.9 % 100 mL IVPB (has no administration in time range)  docusate sodium (COLACE) capsule 100 mg (has no administration in time range)  polyethylene glycol (MIRALAX / GLYCOLAX) packet 17 g (has no administration in time range)  acetaminophen (OFIRMEV) IV 500 mg (500 mg Intravenous New Bag/Given 07/12/2022 2309)  sodium bicarbonate 150 mEq in dextrose 5 % 1,150 mL infusion ( Intravenous New  Bag/Given 06/26/2022 2308)  lactulose (CHRONULAC) enema 200 gm (has no administration in time range)  sodium bicarbonate injection 50 mEq (50 mEq Intravenous Given 06/26/2022 1631)  ceFEPIme (MAXIPIME) 2 g in sodium chloride 0.9 % 100 mL IVPB (0 g Intravenous Stopped 06/22/2022 1650)  metroNIDAZOLE (FLAGYL) IVPB 500 mg (0 mg Intravenous Stopped 06/19/2022 1818)  vancomycin (VANCOREADY) IVPB 2000 mg/400 mL (0 mg Intravenous Stopped 07/12/2022 1852)  iohexol (OMNIPAQUE) 350 MG/ML injection 100 mL (100 mLs Intravenous Contrast Given 07/03/2022 1732)  sodium chloride 0.9 % bolus 1,000 mL (0 mLs Intravenous Stopped 06/26/2022 1802)  sodium bicarbonate injection 100 mEq (100 mEq Intravenous Given 07/14/2022 2014)  dextrose 50 % solution (  Duplicate 07/05/2022 2003)  dextrose 50 % solution 25 g (25 g Intravenous Given 07/02/2022 1947)  sodium bicarbonate 1 mEq/mL injection (  Duplicate 07/09/2022 2027)  sodium bicarbonate injection 50 mEq (50 mEq Intravenous Given 06/25/2022 2136)  sodium bicarbonate 1 mEq/mL injection (  Duplicate 06/21/2022 2137)       .   clinical Impression:  1. Sepsis with acute renal failure and septic shock, due to unspecified organism, unspecified acute renal failure type      Admit   Final Clinical Impression(s) / ED Diagnoses Final diagnoses:  Sepsis with acute renal failure and septic shock, due to unspecified organism, unspecified acute renal failure type    Rx / DC Orders ED Discharge Orders     None         Glyn Ade, MD 06/29/2022 2350

## 2022-06-30 NOTE — ED Notes (Signed)
Vascular remains at [facility]. Pedal pulse signals present throughout.

## 2022-06-30 NOTE — ED Notes (Signed)
Aline in L wrist. CXR at [facility]. Levophed started.

## 2022-06-30 NOTE — ED Notes (Signed)
Pt unable to swallow water/ pt attempted to swallow but spit out

## 2022-06-30 NOTE — Progress Notes (Signed)
Elink following for sepsis protocol. 

## 2022-06-30 NOTE — ED Notes (Addendum)
Lab confirms have received and is running 2nd lactic acid.

## 2022-06-30 NOTE — Procedures (Signed)
[name] [name] was hypotensive, in distress, requiring arterial line for blood pressure management as he had a vasopressor requirement.  Previous left wrist was prepped and draped in standard fashion and an ultrasound was used to evaluate the left radial artery.  A femoral arterial line kit was brought onto the field, as it was the only 1 available.  Using Seldinger technique, an 18-gauge needle was used to cannulate the left radial artery in retrograde fashion under ultrasound guidance.  There was no blood return due to severe vasospasm.  I was able to advance a wire and after using Seldinger technique to replace with a sheath, had pulsatile backbleeding.  This was stitched into place, and a dressing was placed.   Successful left radial artery arterial line placement.  [name] Sparrow MD

## 2022-06-30 NOTE — ED Notes (Signed)
PCCM into room, at [facility].

## 2022-06-30 NOTE — H&P (Signed)
NAME:  [name] [name], MRN:  [id number], DOB:  [birth year], LOS: 0 ADMISSION DATE:  06/23/2022, CONSULTATION DATE:  07/13/2022 REFERRING MD:  Glyn Ade, MD , CHIEF COMPLAINT: Altered mental status  History of Present Illness:  68 year old male with chronic A-fib who was recently discharged from this hospital after he was admitted with acute aortic occlusion underwent aortic, bilateral iliofemoral, femoral popliteal embolectomy and bilateral lower extremity fasciotomy, complicated with right groin hematoma in the setting of anticoagulation, it was evacuated and wound VAC well-placed.  Patient was recommended for acute rehab, he refused and left home today he presented with altered mental status and not feeling well.  He on workup he was noted to have acute kidney injury, hyponatremia, hypokalemia severe metabolic acidosis with a lactate over 9.  PCCM was consulted for help evaluation medical management  Pertinent  Medical History   Past Medical History:  Diagnosis Date   A-fib    Acute ischemic stroke    Asthma    Gout    Hypertension    MRSA (methicillin resistant staph aureus) culture positive 2009     Significant Hospital Events: Including procedures, antibiotic start and stop dates in addition to other pertinent events     Interim History / Subjective:  Consulted  Objective   Blood pressure 119/83, pulse 100, temperature (!) 95.2 F (35.1 C), temperature source Rectal, resp. rate (!) 34, height 6\' 4"  (1.93 m), weight 106.6 kg, SpO2 (!) 89 %.        Intake/Output Summary (Last 24 hours) at 06/18/2022 1850 Last data filed at 06/22/2022 1818 Gross per 24 hour  Intake 2350 ml  Output --  Net 2350 ml   Filed Weights   06/19/2022 1636  Weight: 106.6 kg    Examination:   Physical exam: General: Crtitically ill-appearing male, lying on the bed HEENT: [place]/AT, eyes anicteric.  Severely dry mucous membranes Neuro: Weak, confused, yelling and crying Chest: Coarse breath  sounds, no wheezes or rhonchi Heart: Tachycardic, irregular rhythm, no murmurs or gallops Abdomen: Soft, nondistended, bowel sounds present Extremities right leg fasciotomy wounds looks red, tender and swollen, left leg fasciotomy wound looks clean Skin: Bilateral groin wound looks clean    Resolved Hospital Problem list     Assessment & Plan:  Severe sepsis due to probably infected right fasciotomy wound Acute septic encephalopathy Acute kidney injury due to septic ATN and severe dehydration Hyponatremia Hyperkalemia Metabolic acidosis/lactic acidosis Chronic A-fib on and coagulation Recent aortic occlusion s/p aortic, bilateral iliofemoral, femoral popliteal embolectomy and bilateral lower extremity fasciotomy  Continue aggressive IV fluid therapy Continue broad-spectrum antibiotics Follow-up cultures Avoid sedation Monitor intake and output Avoid nephrotoxic agent Closely monitor electrolytes and supplement Will give 2 A of bicarbonate Trend BMP Hold anticoagulation as patient is coagulopathic with INR of 3 Vascular surgery is following Closely monitor pulses in bilateral lower extremities  Best Practice (right click and "Reselect all SmartList Selections" daily)   Diet/type: clear liquids DVT prophylaxis: not indicated GI prophylaxis: N/A Lines: N/A Foley:  Yes, and it is still needed Code Status:  full code Last date of multidisciplinary goals of care discussion [pending]  Labs   CBC: Recent Labs  Lab 07/04/2022 1614 06/22/2022 1621  WBC  --  15.4*  NEUTROABS  --  11.1*  HGB 15.0  15.3 13.0  HCT 44.0  45.0 43.3  MCV  --  107.2*  PLT  --  199    Basic Metabolic Panel: Recent Labs  Lab 06/26/22 1344  2022-07-03 1614 07-03-22 1621  NA 133* 128*  128* 130*  K 4.6 5.2*  5.2* 5.4*  CL 104 103 96*  CO2 20  --  <7*  GLUCOSE 152* 73 77  BUN 18 53* 53*  CREATININE 0.91 4.30* 4.04*  CALCIUM 9.3  --  9.2   GFR: Estimated Creatinine Clearance: 23.8  mL/min (A) (by C-G formula based on SCr of 4.04 mg/dL (H)). Recent Labs  Lab 2022/07/03 1620 07-03-22 1621  WBC  --  15.4*  LATICACIDVEN >9.0*  --     Liver Function Tests: Recent Labs  Lab Jul 03, 2022 1621  AST 169*  ALT 69*  ALKPHOS 93  BILITOT 2.9*  PROT 6.8  ALBUMIN 2.6*   Recent Labs  Lab 07/03/2022 1621  LIPASE 23   No results for input(s): "AMMONIA" in the last 168 hours.  ABG    Component Value Date/Time   PHART 7.24 (L) Jul 03, 2022 1745   PCO2ART <18 (LL) July 03, 2022 1745   PO2ART 260 (H) 2022-07-03 1745   HCO3 5.6 (L) July 03, 2022 1745   TCO2 10 (L) Jul 03, 2022 1614   TCO2 8 (L) 2022-07-03 1614   ACIDBASEDEF 20.0 (H) 07/03/22 1745   O2SAT 100 July 03, 2022 1745     Coagulation Profile: Recent Labs  Lab July 03, 2022 1621  INR 3.1*    Cardiac Enzymes: No results for input(s): "CKTOTAL", "CKMB", "CKMBINDEX", "TROPONINI" in the last 168 hours.  HbA1C: Hgb A1c MFr Bld  Date/Time Value Ref Range Status  06/16/2022 01:03 AM 5.8 (H) 4.8 - 5.6 % Final    Comment:    (NOTE)         Prediabetes: 5.7 - 6.4         Diabetes: >6.4         Glycemic control for adults with diabetes: <7.0   02/21/2018 08:55 AM 5.1 4.8 - 5.6 % Final    Comment:    (NOTE) Pre diabetes:          5.7%-6.4% Diabetes:              >6.4% Glycemic control for   <7.0% adults with diabetes     CBG: No results for input(s): "GLUCAP" in the last 168 hours.  Review of Systems:   Unable to obtain as patient is encephalopathic  Past Medical History:  He,  has a past medical history of A-fib, Acute ischemic stroke, Asthma, Gout, Hypertension, and MRSA (methicillin resistant staph aureus) culture positive (2009).   Surgical History:   Past Surgical History:  Procedure Laterality Date   AORTOGRAM N/A 06/10/2022   Procedure: AORTOGRAM;  Surgeon: Victorino Sparrow, MD;  Location: [facility] OR;  Service: Vascular;  Laterality: N/A;   APPLICATION OF WOUND VAC Right 06/10/2022   Procedure: APPLICATION OF  WOUND VAC WITH MYRIAD MORCELLS;  Surgeon: Victorino Sparrow, MD;  Location: [facility] OR;  Service: Vascular;  Laterality: Right;   EMBOLECTOMY  06/10/2022   Procedure: SUPERFICIAL FEMORAL ARTERY-POPITEAL EMBOLECTOMY;  Surgeon: Victorino Sparrow, MD;  Location: [facility] OR;  Service: Vascular;;   FASCIOTOMY Bilateral 06/10/2022   Procedure: BILATERAL FOUR COMPARTMENT FASCIOTOMY;  Surgeon: Victorino Sparrow, MD;  Location: [facility] OR;  Service: Vascular;  Laterality: Bilateral;   FASCIOTOMY CLOSURE Bilateral 06/12/2022   Procedure: BILATERAL LOWER EXTREMITY FASCIOTOMY CLOSURE;  Surgeon: Victorino Sparrow, MD;  Location: [facility] OR;  Service: Vascular;  Laterality: Bilateral;   HEMATOMA EVACUATION Right 06/10/2022   Procedure: EVACUATION LEFT GROIN  HEMATOMA;  Surgeon: Victorino Sparrow, MD;  Location: [facility]  OR;  Service: Vascular;  Laterality: Right;   LOWER EXTREMITY ANGIOGRAM Bilateral 06/10/2022   Procedure: BILATERAL LOWER EXTREMITY ANGIOGRAM;  Surgeon: Victorino Sparrow, MD;  Location: [facility] OR;  Service: Vascular;  Laterality: Bilateral;   THROMBECTOMY FEMORAL ARTERY Bilateral 06/10/2022   Procedure: THROMBECTOMY BILATERAL AOTRA-ILIO-FEMORAL ARTERY;  Surgeon: Victorino Sparrow, MD;  Location: [facility] OR;  Service: Vascular;  Laterality: Bilateral;     Social History:   reports that he has been smoking cigarettes. He has a 26.50 pack-year smoking history. He has never used smokeless tobacco. He reports current alcohol use. He reports that he does not currently use drugs.   Family History:  His family history includes Heart disease in his mother; Hypertension in his father.   Allergies Allergies  Allergen Reactions   Cantaloupe (Diagnostic) Other (See Comments)    Patient states it causes his throat to itch   Carrot [Daucus Carota] Other (See Comments)    Patient states it causes his throat to itch   Codeine Nausea And Vomiting     Home Medications  Prior to Admission medications   Medication Sig Start Date End Date Taking?  Authorizing Provider  albuterol (PROVENTIL HFA;VENTOLIN HFA) 108 (90 Base) MCG/ACT inhaler Inhale 2 puffs into the lungs every 6 (six) hours as needed for wheezing or shortness of breath. Patient not taking: Reported on 06/12/2022 04/07/18   Kallie Locks, FNP  apixaban (ELIQUIS) 5 MG TABS tablet Take 1 tablet (5 mg total) by mouth 2 (two) times daily. 06/19/22   Emilie Rutter, PA-C  aspirin EC 81 MG tablet Take 1 tablet (81 mg total) by mouth daily. Swallow whole. 06/20/22   Victorino Sparrow, MD  atorvastatin (LIPITOR) 40 MG tablet Take 1 tablet (40 mg total) by mouth daily. 06/19/22 09/17/22  Emilie Rutter, PA-C  cyclobenzaprine (FLEXERIL) 10 MG tablet Take 1 tablet (10 mg total) by mouth 3 (three) times daily as needed for muscle spasms. Patient not taking: Reported on 06/12/2022 04/07/18   Kallie Locks, FNP  ibuprofen (ADVIL) 200 MG tablet Take 600-800 mg by mouth daily as needed for mild pain or headache.    [provider]  metoprolol succinate (TOPROL-XL) 50 MG 24 hr tablet Take 1 tablet (50 mg total) by mouth 2 (two) times daily. Take with or immediately following a meal. 06/19/22   Emilie Rutter, PA-C  Multiple Vitamin (MULTIVITAMIN) tablet Take 1 tablet by mouth daily.    [provider]  oxyCODONE (OXY IR/ROXICODONE) 5 MG immediate release tablet Take 1 tablet (5 mg total) by mouth every 6 (six) hours as needed for moderate pain. 07-21-22   Victorino Sparrow, MD  sacubitril-valsartan (ENTRESTO) 24-26 MG Take 1 tablet by mouth 2 (two) times daily. 06/19/22   Emilie Rutter, PA-C  spironolactone (ALDACTONE) 25 MG tablet Take 1/2 tablet (12.5 mg total) by mouth daily. 06/19/22   Emilie Rutter, PA-C     Critical care time:      This patient is critically ill with multiple organ system failure which requires frequent high complexity decision making, assessment, support, evaluation, and titration of therapies. This was completed through the application of advanced  monitoring technologies and extensive interpretation of multiple databases.  During this encounter critical care time was devoted to patient care services described in this note for 44 minutes.    Cheri Fowler, MD [facility] See Amion for pager If no response to pager, please call [phone] until 7pm After 7pm, Please call E-link [phone]

## 2022-06-30 NOTE — Progress Notes (Signed)
Hypoglycemic Event  CBG: 43   Treatment: D50 50 mL (25 gm)  Symptoms: Nervous/irritable  Follow-up CBG: Time:2008 CBG Result:90  Possible Reasons for Event: Inadequate meal intake  Comments/MD notified: CCM    Cranford Mon

## 2022-06-30 NOTE — Progress Notes (Signed)
Pharmacy Antibiotic Note  [name] [name] is a 67 y.o. male for which pharmacy has been consulted for cefepime and vancomycin dosing for sepsis.  Patient with a history of AF, CVA. 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 Patient presenting with bilateral leg pain, weakness and testicle pain.  SCr 4.04 - AKI WBC 15.4; LA >9; T 95.2; HR 88; RR 28  Plan: Metronidazole per MD Cefepime 2g q24hr  Vancomycin 2000 mg once, subsequent dosing as indicated per random vancomycin level until renal function stable and/or improved, at which time scheduled dosing can be considered Trend WBC, Fever, Renal function F/u cultures, clinical course, WBC De-escalate when able  Height: 6\' 4"  (193 cm) Weight: 106.6 kg (235 lb) IBW/kg (Calculated) : 86.8  No data recorded.  Recent Labs  Lab 06/26/22 1344 06/29/2022 1614  CREATININE 0.91 4.30*    Estimated Creatinine Clearance: 22.3 mL/min (A) (by C-G formula based on SCr of 4.3 mg/dL (H)).    Allergies  Allergen Reactions   Cantaloupe (Diagnostic) Other (See Comments)    Patient states it causes his throat to itch   Carrot [Daucus Carota] Other (See Comments)    Patient states it causes his throat to itch   Codeine Nausea And Vomiting   Microbiology results: Pending  Thank you for allowing pharmacy to be a part of this patient's care.  Delmar Landau, PharmD, BCPS 06/28/2022 4:45 PM ED Clinical Pharmacist -  [phone]

## 2022-06-30 NOTE — Progress Notes (Addendum)
eLink Physician-Brief Progress Note Patient Name: [name] [name] DOB: [birth year] MRN: [id number]   Date of Service  06/19/2022  HPI/Events of Note  Brief New admit note: 67 y.o. male who presents with altered mental status, hypotension, tachycardia.  Lactate over 9, acute kidney injury 3 weeks status post aortic occlusion repair via bilateral iliofemoral, femoral-popliteal embolectomy, admitted to ICU for  Severe sepsis: source suspected from lower  extremity cellulitis. CTA run off and CT abdomen chest: no pneumonia, no hydronephrosis or ischemia. No acute abdomen.  Encephalopathy: cirrhosis on CT .  AKI/ATN, dehydration Electrolyte imbalance, AGMA  Chronic afib on AC  - AC on hold - follow BMP, ABG - asp precautions - Dr Tish Frederickson and vascular advice to folow - on abx. -CBG goal < 180 Data: reviewed  Camera evaluation done: On nasal o2. Art line.  Hypothermia. SBP 137, sats 100%. Sats 89%. Discussed with RN. Got 2 bicarb push on fluids. Getting ABG now    [name] [name]. [name], MD, FCCP. [id number]  eICU Interventions       Intervention Category Major Interventions: Acid-Base disturbance - evaluation and management;Acute renal failure - evaluation and management;Electrolyte abnormality - evaluation and management Evaluation Type: New Patient Evaluation  [name] [name] 07/05/2022, 8:22 PM  21:06 ABG reviewed; compensated metabolic acidosis with pH 7.39 Camera re eval done. Discussed with RN. Back pain, need meds, due to risk for aspiration avoiding oral meds.  On nasal o2. Hco3 decreasing. Able to protect airways. Making 40 ml/hr.  Discussed with Dr Sherral Hammers, Vascular surgery. AMS, elevated INR, cirrhosis.  - ammonia stat - CT head stat - bicarb push once. - IV tylenol once.  22:14 Hypothermia. Severe sepsis.  See above H and P. Source so far just lower extremity cellulitis/fascitis- small focus of infection as per Dr Karin Lieu. No purulent. No compartment  syndrome.   But sweating profusely. Refusing bear hugger - mostly has bacteremia, blood cultures pending. - on Vanc/cefepime - not on pressors. - will start bicarb drip. CBG 90.LA still > 9.  - Nephrology consultation if worsening hyperkalemia or acidosis.   Cirrhosis/AMS. Elevated ammonia - lactulose enema, NPO, asp precautions Follow CT head  23:38 SBP 170. CT Head : neg. Hypothermia, sweating.  S/p 2 lit from ED  - get TSH - 500 ml saline over an hour. - watch BP. - getting tylenol now. - able to follow commands. - AMS stable- mostly from liver failure.  Discussed with [name] [name], wife current status and plan of care.     Camera to room at around 00: 40  Brady with stable MAP. HR went under 30 and code blue called  CODE : Huston Foley arrest to V tach, v fib. Ground team MD ran the code. Multiple > 10 shock, biicarb, calcium, mag. never achieved ROSC. Code was called off at around 01:12. I have notified Dr Sherral Hammers, Vascular about events.

## 2022-07-01 DIAGNOSIS — I4901 Ventricular fibrillation: Secondary | ICD-10-CM | POA: Diagnosis not present

## 2022-07-01 LAB — BLOOD CULTURE ID PANEL (REFLEXED) - BCID2

## 2022-07-01 LAB — COMPREHENSIVE METABOLIC PANEL
ALT: 96 U/L — ABNORMAL HIGH (ref 0–44)
AST: 397 U/L — ABNORMAL HIGH (ref 15–41)
Albumin: 1.8 g/dL — ABNORMAL LOW (ref 3.5–5.0)
Alkaline Phosphatase: 69 U/L (ref 38–126)
Anion gap: 24 — ABNORMAL HIGH (ref 5–15)
BUN: 54 mg/dL — ABNORMAL HIGH (ref 8–23)
CO2: 8 mmol/L — ABNORMAL LOW (ref 22–32)
Calcium: 7.8 mg/dL — ABNORMAL LOW (ref 8.9–10.3)
Chloride: 99 mmol/L (ref 98–111)
Creatinine, Ser: 3.2 mg/dL — ABNORMAL HIGH (ref 0.61–1.24)
GFR, Estimated: 20 mL/min — ABNORMAL LOW (ref >60)
Glucose, Bld: 138 mg/dL — ABNORMAL HIGH (ref 70–99)
Potassium: 5.8 mmol/L — ABNORMAL HIGH (ref 3.5–5.1)
Sodium: 131 mmol/L — ABNORMAL LOW (ref 135–145)
Total Bilirubin: 2.9 mg/dL — ABNORMAL HIGH (ref 0.3–1.2)
Total Protein: 4.9 g/dL — ABNORMAL LOW (ref 6.5–8.1)

## 2022-07-01 LAB — BLOOD GAS, ARTERIAL
Acid-base deficit: 17 mmol/L — ABNORMAL HIGH (ref 0.0–2.0)
Bicarbonate: 7 mmol/L — ABNORMAL LOW (ref 20.0–28.0)
Drawn by: 33730
O2 Saturation: 99.7 %
Patient temperature: 35.1
pCO2 arterial: <18 mmHg — CL (ref 32–48)
pH, Arterial: 7.31 — ABNORMAL LOW (ref 7.35–7.45)
pO2, Arterial: 131 mmHg — ABNORMAL HIGH (ref 83–108)

## 2022-07-01 MED ORDER — EPINEPHRINE 1 MG/10ML IJ SOSY
PREFILLED_SYRINGE | INTRAMUSCULAR | Status: AC
  Filled 2022-07-01: qty 40

## 2022-07-03 LAB — CULTURE, BLOOD (ROUTINE X 2): Special Requests: ADEQUATE

## 2022-07-04 ENCOUNTER — Encounter (HOSPITAL_COMMUNITY): Payer: Medicare Other

## 2022-07-09 ENCOUNTER — Ambulatory Visit: Payer: Medicare Other | Admitting: Physician Assistant

## 2022-07-17 MED FILL — Medication: Qty: 1 | Status: AC

## 2022-07-18 NOTE — Progress Notes (Signed)
   07/14/2022 0115  Spiritual Encounters  Type of Visit Initial  Care provided to: Pt not available  Reason for visit Code  OnCall Visit Yes   Chaplain responded to a code blue. The patient, [name] [name], was attended to by the medical team. No family is present. If a chaplain is requested someone will respond.   Valerie Roys [facility] [phone]

## 2022-07-18 NOTE — Progress Notes (Signed)
I was called by critical care after the patient's V-fib arrest which resulted in mortality. I immediately called Maggie's family, who were on their way to the hospital. I met them bedside and offered condolences.   Victorino Sparrow MD

## 2022-07-18 NOTE — Procedures (Signed)
Intubation Procedure Note  [name] [name]  [id number]  1955-05-21  Date:07/06/2022  Time:1:27 AM   Provider Performing:Cionna Collantes Gaynell Face    Procedure: Intubation (31500)  Indication(s) Respiratory Failure  Consent Unable to obtain consent due to emergent nature of procedure.   Anesthesia During code   Time Out Verified patient identification, verified procedure, site/side was marked, verified correct patient position, special equipment/implants available, medications/allergies/relevant history reviewed, required imaging and test results available.   Sterile Technique Usual hand hygeine, masks, and gloves were used   Procedure Description Patient positioned in bed supine.  Sedation given as noted above.  Patient was intubated with endotracheal tube using Glidescope.  View was Grade 1 full glottis .  Number of attempts was 1.  Colorimetric CO2 detector was consistent with tracheal placement.   Complications/Tolerance During cardiac arrest Chest X-ray is ordered to verify placement.   EBL Minimal   Specimen(s) None

## 2022-07-18 NOTE — Progress Notes (Signed)
RN in room, patient went into PEA at 0038. Code blue activated. TOD called by Briant Sites, DO at 0111. See code sheet for details.   Cranford Mon

## 2022-07-18 NOTE — Significant Event (Signed)
At bedside amid resuscitative efforts. Called Wife Harriett Sine to update on the patient's arrest, also spoke with her son.  Remained full code, family understandably very shocked and distraught. Encouraged to come to hospital   After 32 minutes of resuscitative efforts, TOD called at 111.   Called family to notify. They plan to come to hospital   Tessie Fass MSN, AGACNP-BC [facility] Pulmonary/Critical Care Medicine 07/07/2022, 1:15 AM

## 2022-07-18 NOTE — Progress Notes (Signed)
Cross covering ICU physician.   Called for code blue.  Pt in refractory vfib.  Intubated emergently Noted profound acidosis so bicarb infusion was increased to 219ml/hr Acls ongoing given calcium, insulin d50, bicarb, amio, lidocaine, mag  Defib numerous times.   Unfortunately did not survive. Expired at 0111. Family notified

## 2022-07-18 NOTE — Death Summary Note (Signed)
DEATH SUMMARY   Patient Details  Name: [name] [name] MRN: [id number] DOB: 02/03/1956  Admission/Discharge Information   Admit Date:  Jul 02, 2022  Date of Death: Date of Death: 07/03/22  Time of Death: Time of Death: 0111  Length of Stay: 1  Referring Physician: Kallie Locks, FNP   Reason(s) for Hospitalization  Severe sepsis due to probably infected right fasciotomy wound MRSA bacteremia Acute septic encephalopathy Acute kidney injury due to septic ATN and severe dehydration Hyponatremia Hyperkalemia Severe metabolic acidosis/lactic acidosis Chronic A-fib on and coagulation Recent aortic occlusion s/p aortic, bilateral iliofemoral, femoral popliteal embolectomy and bilateral lower extremity fasciotomy V-fib cardiac arrest  Diagnoses  Preliminary cause of death: Recurrent V-fib cardiac arrest in the setting of severe acidosis from multisystem organ failure Secondary Diagnoses (including complications and co-morbidities):  Principal Problem:   Acute renal failure (ARF) Active Problems:   Ventricular fibrillation   Brief Hospital Course (including significant findings, care, treatment, and services provided and events leading to death)  [name] [name] is a 67 y.o. year old male who 67 year old male with chronic A-fib who was recently discharged from this hospital after he was admitted with acute aortic occlusion underwent aortic, bilateral iliofemoral, femoral popliteal embolectomy and bilateral lower extremity fasciotomy, complicated with right groin hematoma in the setting of anticoagulation, it was evacuated and wound VAC well-placed. Patient was recommended for acute rehab, he refused and left home today he presented with altered mental status and not feeling well. He on workup he was noted to have acute kidney injury, hyponatremia, hypokalemia severe metabolic acidosis with a lactate over 9. PCCM was consulted for help evaluation medical management  Patient was  admitted to ICU, he was given multiple amps of bicarbonate infusion, he was started on broad-spectrum antibiotics, was given aggressive IV fluid resuscitation.  On the same night of admission he went into V-fib, shocked multiple times, ROSC was achieved but again he went into V-fib and after prolonged CPR, patient did not recover.  His blood culture grew MRSA in all 4 bottles.  He was declared dead on 03-Jul-2022 at 1:11 AM.    Pertinent Labs and Studies  Significant Diagnostic Studies CT HEAD WO CONTRAST ( )  Result Date: 07-02-2022 CLINICAL DATA:  Initial evaluation for neuro deficit, stroke. EXAM: CT HEAD WITHOUT CONTRAST TECHNIQUE: Contiguous axial images were obtained from the base of the skull through the vertex without intravenous contrast. RADIATION DOSE REDUCTION: This exam was performed according to the departmental dose-optimization program which includes automated exposure control, adjustment of the mA and/or kV according to patient size and/or use of iterative reconstruction technique. COMPARISON:  Prior exam from 06/10/2022. FINDINGS: Brain: Cerebral volume within normal limits. Mild patchy hypodensity involving the supratentorial cerebral white matter, most likely related chronic microvascular ischemic disease, mild in nature, and similar to prior. No acute intracranial hemorrhage. No acute large vessel territory infarct. No mass lesion or midline shift. No hydrocephalus or extra-axial fluid collection. Vascular: IV contrast material remains on board from CT performed earlier the same day. Scattered calcified atherosclerosis present at the skull base. Skull: Scalp soft tissues demonstrate no acute finding. Calvarium intact. Sinuses/Orbits: Globes orbital soft tissues within normal limits. Paranasal sinuses are clear. No mastoid effusion. Other: None. IMPRESSION: 1. No acute intracranial abnormality. 2. Mild chronic microvascular ischemic disease, stable. Electronically Signed   By: Rise Mu M.D.   On: 02-Jul-2022 23:04   DG Chest Port 1 View  Result Date: 2022-07-02 CLINICAL DATA:  Questionable sepsis - evaluate  for abnormality EXAM: PORTABLE CHEST 1 VIEW COMPARISON:  Chest CT available at time of radiograph interpretation. FINDINGS: The cardiomediastinal contours are normal. Mild bronchial thickening. Pulmonary vasculature is normal. No consolidation, pleural effusion, or pneumothorax. No acute osseous abnormalities are seen. IMPRESSION: Mild bronchial thickening without focal consolidation. Electronically Signed   By: Narda Rutherford M.D.   On: Jul 19, 2022 18:02   CT ANGIO CHEST AORTA W/CM &/OR WO/CM  Result Date: 2022-07-19 CLINICAL DATA:  Acute aortic syndrome (AAS) suspected [id number] 1 minute Apgar score 6 [id number] EXAM: CT ANGIOGRAPHY CHEST WITH CONTRAST TECHNIQUE: Multidetector CT imaging of the chest was performed using the standard protocol during bolus administration of intravenous contrast. Multiplanar CT image reconstructions and MIPs were obtained to evaluate the vascular anatomy. RADIATION DOSE REDUCTION: This exam was performed according to the departmental dose-optimization program which includes automated exposure control, adjustment of the mA and/or kV according to patient size and/or use of iterative reconstruction technique. CONTRAST:  OMNIPAQUE IOHEXOL 350 MG/ML SOLN COMPARISON:  None Available. FINDINGS: Cardiovascular: No aortic hematoma noncontrast exam. Moderate atheromatous aortic atherosclerosis. No dissection, aneurysm or evidence of acute aortic syndrome. There are coronary artery calcifications. The heart is upper normal in size. No pericardial effusion. Mediastinum/Nodes: No mediastinal or hilar adenopathy. Mild wall thickening of the distal esophagus which contains a small amount of fluid. Lungs/Pleura: Motion artifact limitations. Central bronchial thickening. No focal airspace disease. No pleural fluid. No features of pulmonary edema. No pulmonary  mass. Upper Abdomen: Assessed on concurrent abdominal CTA, reported separately. Cirrhosis noted. Musculoskeletal: Diffuse thoracic spondylosis with anterior spurring. Remote right clavicle fracture. There are no acute or suspicious osseous abnormalities. Review of the MIP images confirms the above findings. IMPRESSION: 1. Aortic atherosclerosis.  No dissection or acute aortic findings. 2. Central bronchial thickening. 3. Mild wall thickening of the distal esophagus which contains a small amount of fluid, can be seen with reflux or esophagitis. Aortic Atherosclerosis (ICD10-I70.0). Electronically Signed   By: Narda Rutherford M.D.   On: 2022-07-19 18:01   CT Angio Aortobifemoral W and/or Wo Contrast  Result Date: 2022-07-19 CLINICAL DATA:  Claudication or leg ischemia Review of electronic records indicates recent embolectomy and fasciotomies. EXAM: CT ANGIOGRAPHY OF ABDOMINAL AORTA WITH ILIOFEMORAL RUNOFF TECHNIQUE: Multidetector CT imaging of the abdomen, pelvis and lower extremities was performed using the standard protocol during bolus administration of intravenous contrast. Multiplanar CT image reconstructions and MIPs were obtained to evaluate the vascular anatomy. RADIATION DOSE REDUCTION: This exam was performed according to the departmental dose-optimization program which includes automated exposure control, adjustment of the mA and/or kV according to patient size and/or use of iterative reconstruction technique. CONTRAST:  OMNIPAQUE IOHEXOL 350 MG/ML SOLN COMPARISON:  Lower extremity CTA 3 weeks ago FINDINGS: VASCULAR Aorta: Moderate aortic atherosclerosis. No aneurysm or severe aortic stenosis. Motion artifact limitations. Celiac: Patent proximally allowing for motion artifact. Poor peripheral assessment due to motion. SMA: Patent proximally allowing for motion artifact. Or peripheral assessment due to motion. Renals: Both bilateral renal arteries are patent. No acute findings. IMA: Patent at the  origin.  Poor peripheral assessment. RIGHT Lower Extremity Inflow: The right common femoral artery was previously occluded, now demonstrates blood flow. There is moderate calcified plaque. The internal iliac artery is occluded at its origin with distal reconstitution. There is flow throughout the right external iliac artery. Postsurgical change in the inguinal region. No pseudoaneurysm. Outflow: The previously occluded right femoral arteries min straight blood flow, the vessels are small in caliber. The profundus  is patent proximally, but no flow is seen distally. Small caliber superficial femoral artery without severe stenosis or occlusion. Mild plaque in the popliteal artery which is small in caliber. Runoff: Calf vessels are small in caliber, there is flow proximally. On delayed phase there is 2 vessel runoff to the ankle. LEFT Lower Extremity Inflow: Previous thrombus at the origin of the common iliac artery has resolved. Moderate atheromatous plaque. The internal iliac artery is small in caliber but patent. The external common iliac artery is patent with moderate atheromatous plaque. Postsurgical change in the inguinal region, no pseudoaneurysm. Outflow: Previously occluded common femoral artery now demonstrates flow. The profundus femoral artery is patent. Superficial femoral artery is small in caliber. There is no significant stenosis or occlusion. The popliteal artery is small in caliber but patent. Runoff: The calf vessels are small in caliber. Flow is demonstrated proximally. On delayed phase, single-vessel runoff to the ankle. Veins: Not well assessed on this arterial phase exam. Review of the MIP images confirms the above findings. NON-VASCULAR Lower chest: Assessed on concurrent chest CT, reported separately. Hepatobiliary: Motion artifact through the liver. Cirrhotic hepatic morphology. No evidence of arterial enhancing liver lesion. Calcified gallstones. No gallbladder inflammation. No biliary  dilatation. Pancreas: Parenchymal atrophy. No acute findings allowing for motion artifact. No evidence of inflammatory change. Spleen: Normal in size. Adrenals/Urinary Tract: No adrenal nodule. Motion artifact through the kidneys limits assessment. Suggestion of heterogeneous bilateral renal enhancement, suboptimally assessed due to motion. Similar findings were seen on prior exam. No hydronephrosis. There is mild bladder wall thickening, more so on the right lateral bladder wall. Stomach/Bowel: Motion artifact limits bowel assessment. Moderate gastric distention with intraluminal fluid. There is fluid in the distal esophagus versus esophageal wall thickening. No obvious bowel obstruction or inflammation. Lymphatic: No bulky adenopathy. Reproductive: Prostate is unremarkable. Other: Small amount of free fluid in the pelvis appears simple. Fat in both inguinal canals. No free intra-abdominal air. Musculoskeletal: Diffuse degenerative change in the lumbar spine. Bilateral lower extremity fasciotomies. Focal fluid collection. Expected fatty striations are demonstrated in the calf musculature. There is subcutaneous edema of the right lower extremity extending from the perineum to the knee. Faint subcutaneous edema in the medial left upper thigh. IMPRESSION: VASCULAR 1. Interval embolectomy since prior CTA. 2. Reconstitution of the previous right common iliac occlusion with 2 vessel runoff to the ankle. No evidence of recurrent occlusion or severe stenosis. Lower extremity vasculature is small in caliber with moderate diffuse atheromatous disease. 3. Reconstitution of the previous left superficial femoral artery occlusion with single-vessel runoff to the ankle. No evidence of recurrent occlusion or severe stenosis. The lower extremity vasculature is small in caliber with moderate diffuse atheromatous disease. 4.  Aortic Atherosclerosis (ICD10-I70.0). NON-VASCULAR 1. Heterogeneous enhancement of both kidneys similar to  prior exam and may represent sequela of renal infarcts. This is suboptimally assessed due to motion on the current exam. 2. Hepatic cirrhosis.  Gallstones. 3. Small amount of simple free fluid in the pelvis. Electronically Signed   By: Narda Rutherford M.D.   On: 07/11/2022 17:57   ECHOCARDIOGRAM COMPLETE  Result Date: 06/11/2022    ECHOCARDIOGRAM REPORT   Patient Name:   GATSBY CHISMAR Date of Exam: 06/11/2022 Medical Rec #:  [id number]        Height:       76.0 in Accession #:    [id number]       Weight:       240.0 lb Date of Birth:  1955/04/27  BSA:          2.394 m Patient Age:    66 years         BP:           89/50 mmHg Patient Gender: M                HR:           102 bpm. Exam Location:  Inpatient Procedure: 2D Echo, Color Doppler, Cardiac Doppler and Intracardiac            Opacification Agent Indications:    Lower Extremity Arterial Thrombosis  History:        Patient has prior history of Echocardiogram examinations, most                 recent 02/21/2018. Arrythmias:Atrial Fibrillation; Risk                 Factors:Hypertension.  Sonographer:    Irving Burton Senior RDCS Referring Phys: [id number] JOSHUA E ROBINS  Sonographer Comments: Technically difficult study due to poor echo windows, scanned supine due to post-op status IMPRESSIONS  1. Technically difficult study with poor visualization of cardiac structures. Patient in atrial fibrillation.  2. Left ventricular systolic function is mildly reduced secondary to atrial fibrillation with an estimated EF of 40%. There is significant beat to beat variability. Consider repeat limited study once patient in sinus rhythm.  3. Right ventricular systolic function is normal. The right ventricular size is normal. There is normal pulmonary artery systolic pressure.  4. The mitral valve is normal in structure. No evidence of mitral valve regurgitation. No evidence of mitral stenosis.  5. The aortic valve is normal in structure. Aortic valve regurgitation is not  visualized. No aortic stenosis is present.  6. The inferior vena cava is normal in size with greater than 50% respiratory variability, suggesting right atrial pressure of 3 mmHg. FINDINGS  Left Ventricle: Left ventricular ejection fraction, by estimation, is 40 to 45%. The left ventricle has mildly decreased function. The left ventricle has no regional wall motion abnormalities. Definity contrast agent was given IV to delineate the left ventricular endocardial borders. The left ventricular internal cavity size was normal in size. There is mild left ventricular hypertrophy. Left ventricular diastolic function could not be evaluated due to atrial fibrillation. Left ventricular diastolic function could not be evaluated. Right Ventricle: The right ventricular size is normal. No increase in right ventricular wall thickness. Right ventricular systolic function is normal. There is normal pulmonary artery systolic pressure. The tricuspid regurgitant velocity is 1.78 m/s, and  with an assumed right atrial pressure of 3 mmHg, the estimated right ventricular systolic pressure is 15.7 mmHg. Left Atrium: Left atrial size was normal in size. Right Atrium: Right atrial size was normal in size. Pericardium: There is no evidence of pericardial effusion. Mitral Valve: The mitral valve is normal in structure. No evidence of mitral valve regurgitation. No evidence of mitral valve stenosis. Tricuspid Valve: The tricuspid valve is normal in structure. Tricuspid valve regurgitation is not demonstrated. No evidence of tricuspid stenosis. Aortic Valve: The aortic valve is normal in structure. Aortic valve regurgitation is not visualized. No aortic stenosis is present. Pulmonic Valve: The pulmonic valve was normal in structure. Pulmonic valve regurgitation is not visualized. No evidence of pulmonic stenosis. Aorta: The aortic root is normal in size and structure. Venous: The inferior vena cava is normal in size with greater than 50%  respiratory variability, suggesting right atrial pressure of 3  mmHg. IAS/Shunts: No atrial level shunt detected by color flow Doppler.  LEFT VENTRICLE PLAX 2D LVIDd:         4.60 cm LVIDs:         3.70 cm LV PW:         1.20 cm LV IVS:        1.10 cm LVOT diam:     2.00 cm LV SV:         47 LV SV Index:   20 LVOT Area:     3.14 cm  RIGHT VENTRICLE RV S prime:     8.81 cm/s TAPSE (M-mode): 1.6 cm LEFT ATRIUM             Index        RIGHT ATRIUM           Index LA diam:        3.70 cm 1.55 cm/m   RA Area:     26.30 cm LA Vol (A2C):   44.9 ml 18.76 ml/m  RA Volume:   82.00 ml  34.26 ml/m LA Vol (A4C):   57.7 ml 24.10 ml/m LA Biplane Vol: 55.3 ml 23.10 ml/m  AORTIC VALVE LVOT Vmax:   111.90 cm/s LVOT Vmean:  68.267 cm/s LVOT VTI:    0.149 m  AORTA Ao Root diam: 3.50 cm TRICUSPID VALVE TR Peak grad:   12.7 mmHg TR Vmax:        178.00 cm/s  SHUNTS Systemic VTI:  0.15 m Systemic Diam: 2.00 cm Aditya Sabharwal Electronically signed by Dorthula Nettles Signature Date/Time: 06/11/2022/1:35:46 PM    Final    DG C-Arm 1-60 Min  Result Date: 06/10/2022 CLINICAL DATA:  Post axillary femoral thrombectomy EXAM: INTRAOPERATIVE ARTERIOGRAM TECHNIQUE: Angiographic images from the C-arm fluoroscopic device were submitted for interpretation post-operatively. Please see the procedural report for the amount of contrast and the fluoroscopy time utilized. COMPARISON:  CTA 06/10/2022 FINDINGS: Right distal SFA and popliteal artery grossly patent. Patent peroneal and posterior tibial runoff across the ankle. Anterior tibial artery occludes proximal calf. Left distal SFA and popliteal artery atheromatous but patent. Contiguous left peroneal runoff with prominent posterior communicating artery supplying the foot across the ankle. Distally aortogram shows patency across the bifurcation through the iliac arterial systems. IMPRESSION: 1. Patent right peroneal and posterior tibial runoff. 2. Patent left peroneal and posterior  communicating arteries. 3. Right anterior tibial artery occludes proximal calf. 4. Patent distal aorta and iliac arterial systems. Electronically Signed   By: Corlis Leak M.D.   On: 06/10/2022 08:58   CT Head Wo Contrast  Result Date: 06/10/2022 CLINICAL DATA:  Acute neurological deficit with stroke suspected. New onset atrial fibrillation. EXAM: CT HEAD WITHOUT CONTRAST TECHNIQUE: Contiguous axial images were obtained from the base of the skull through the vertex without intravenous contrast. RADIATION DOSE REDUCTION: This exam was performed according to the departmental dose-optimization program which includes automated exposure control, adjustment of the mA and/or kV according to patient size and/or use of iterative reconstruction technique. COMPARISON:  MRI brain 02/21/2018.  CT head 02/20/2018 FINDINGS: Brain: No evidence of acute infarction, hemorrhage, hydrocephalus, extra-axial collection or mass lesion/mass effect. Vascular: Possible asymmetrical increased density in the right middle cerebral artery which could indicate acute thrombus although no parenchymal changes are identified. Consider MRI for further evaluation if clinically indicated. No mass-effect or midline shift. No abnormal extra-axial fluid collections. Gray-white matter junctions are distinct. Basal cisterns are not effaced. No ventricular dilatation. No acute intracranial hemorrhage. Skull:  Calvarium appears intact. Sinuses/Orbits: Paranasal sinuses and mastoid air cells are clear. Other: None. IMPRESSION: 1. Possible asymmetrical increased density in the right middle cerebral artery possibly representing acute thrombus although no parenchymal changes are identified. Consider CTA or MRI for further evaluation. 2. Otherwise, no acute intracranial abnormality is suggested. These results were called by telephone at the time of interpretation on 06/10/2022 at 1:13 am to provider [facility] , who verbally acknowledged these results.  Electronically Signed   By: Burman Nieves M.D.   On: 06/10/2022 01:15   CT Angio Aortobifemoral W and/or Wo Contrast  Result Date: 06/10/2022 CLINICAL DATA:  Claudication or leg ischemia with bilateral lower extremity weakness and numbness, right greater than left. New onset atrial fibrillation. EXAM: CT ANGIOGRAPHY OF ABDOMINAL AORTA WITH ILIOFEMORAL RUNOFF TECHNIQUE: Multidetector CT imaging of the abdomen, pelvis and lower extremities was performed using the standard protocol during bolus administration of intravenous contrast. Multiplanar CT image reconstructions and MIPs were obtained to evaluate the vascular anatomy. RADIATION DOSE REDUCTION: This exam was performed according to the departmental dose-optimization program which includes automated exposure control, adjustment of the mA and/or kV according to patient size and/or use of iterative reconstruction technique. CONTRAST:  100 mL Omnipaque 350 COMPARISON:  None Available. FINDINGS: VASCULAR Aorta: Diffuse aortic calcification. No aneurysm or dissection. Abdominal aorta is patent to the bifurcation. Celiac: Patent without evidence of aneurysm, dissection, vasculitis or significant stenosis. SMA: Patent without evidence of aneurysm, dissection, vasculitis or significant stenosis. Renals: Both renal arteries are patent without evidence of aneurysm, dissection, vasculitis, fibromuscular dysplasia or significant stenosis. IMA: Patent without evidence of aneurysm, dissection, vasculitis or significant stenosis. RIGHT Lower Extremity Inflow: Diffuse thrombosis is demonstrated with absence of flow beginning at the origin of the common iliac artery and involving the entire common iliac, internal iliac, and external iliac arteries. Outflow: There is a short segment of reconstituted flow in the common femoral artery but there is subsequent complete occlusion with thrombosis of the common femoral artery. The deep femoral artery is completely thrombosed  without flow demonstrated. There is segmental flow in the superficial femoral artery with about 70% stenosis throughout its length. The popliteal artery is completely thrombosed with no flow demonstrated. Runoff: No flow is demonstrated in the runoff vessels. LEFT Lower Extremity Inflow: There is focal thrombus at the origin of the common iliac artery with flow demonstrated around the thrombus. The common iliac artery, internal and external iliac arteries are patent without significant stenosis. Outflow: The common femoral artery is completely thrombosed with no flow demonstrated in the common femoral artery. No flow is demonstrated in the proximal and mid superficial femoral artery. There is reconstitution of flow to the distal superficial femoral artery. Flow is demonstrated in the popliteal artery with vascular narrowing of 70% or greater stenosis. The deep femoral artery is patent. Runoff: No flow is demonstrated in the runoff vessels. Veins: No obvious venous abnormality within the limitations of this arterial phase study. Review of the MIP images confirms the above findings. NON-VASCULAR Lower chest: Lung bases are clear. Hepatobiliary: Hepatic cirrhosis with diffuse parenchymal atrophy, nodular parenchymal pattern with nodular contour, and enlarged lateral segment left and caudate lobes of the liver. No focal lesions are identified. Cholelithiasis without inflammatory change. No bile duct dilatation. Pancreas: Unremarkable. No pancreatic ductal dilatation or surrounding inflammatory changes. Spleen: Normal in size without focal abnormality. Adrenals/Urinary Tract: No adrenal gland nodules. Nephrograms are heterogeneous. This may indicate renal infarcts bilaterally. However, this determination is limited on the  early arterial phase with incomplete characterization of the parenchymal enhancement. No hydronephrosis or hydroureter. Bladder is normal. Stomach/Bowel: Stomach, small bowel, and colon are not  abnormally distended. No wall thickening or inflammatory changes are appreciated. Appendix is normal. Lymphatic: No significant lymphadenopathy. Reproductive: Prostate is unremarkable. Other: No free air or free fluid in the abdomen. Small periumbilical hernia containing fat. Musculoskeletal: Degenerative changes in the spine. IMPRESSION: VASCULAR 1. Diffuse vascular thrombosis with near complete occlusion of the right lower extremity extending from the origin of the common iliac artery through to the runoff vessels. A short segment of reconstituted flow is demonstrated in the common femoral artery. No significant collateral vessels are present. This is likely acute thrombosis. 2. Thrombosis of the left superficial femoral artery with short segment of reconstitution of the popliteal artery. No flow demonstrated in the runoff vessels. No significant collateral vessels are present. This is likely acute thrombosis. 3. Aortic atherosclerosis. NON-VASCULAR 1. 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 2. Hepatic cirrhosis. 3. Cholelithiasis without evidence of acute cholecystitis. Critical Value/emergent results were called by telephone at the time of interpretation on 06/10/2022 at 12:56 am to provider [facility] , who verbally acknowledged these results. Electronically Signed   By: Burman Nieves M.D.   On: 06/10/2022 01:10   DG Chest Port 1 View  Result Date: 06/10/2022 CLINICAL DATA:  Weakness EXAM: PORTABLE CHEST 1 VIEW COMPARISON:  02/20/2018 FINDINGS: Heart size and pulmonary vascularity are normal. Lungs are clear. No pleural effusions. No pneumothorax. Mediastinal contours appear intact. Old fracture deformity of the right clavicle. No change since prior study. IMPRESSION: No active disease. Electronically Signed   By: Burman Nieves M.D.   On: 06/10/2022 00:47    Microbiology Recent Results (from the past 240 hour(s))  Blood culture (routine x 2)     Status: Abnormal    Collection Time: 07/17/2022  4:20 PM   Specimen: BLOOD  Result Value Ref Range Status   Specimen Description BLOOD RIGHT ANTECUBITAL  Final   Special Requests   Final    BOTTLES DRAWN AEROBIC AND ANAEROBIC Blood Culture adequate volume   Culture  Setup Time   Final    GRAM POSITIVE COCCI IN CLUSTERS IN BOTH AEROBIC AND ANAEROBIC BOTTLES    Culture (A)  Final    STAPHYLOCOCCUS AUREUS SUSCEPTIBILITIES PERFORMED ON PREVIOUS CULTURE WITHIN THE LAST 5 DAYS. Performed at [facility], [address] [place], [place] [address]    Report Status 07/03/2022 FINAL  Final  Blood culture (routine x 2)     Status: Abnormal   Collection Time: 06/21/2022  4:27 PM   Specimen: BLOOD LEFT HAND  Result Value Ref Range Status   Specimen Description BLOOD LEFT HAND  Final   Special Requests   Final    BOTTLES DRAWN AEROBIC AND ANAEROBIC Blood Culture results may not be optimal due to an inadequate volume of blood received in culture bottles   Culture  Setup Time   Final    GRAM POSITIVE COCCI IN CLUSTERS IN BOTH AEROBIC AND ANAEROBIC BOTTLES CRITICAL RESULT CALLED TO, READ BACK BY AND VERIFIED WITH: [facility] MADELINE Clovis Riley [id number] AT Aug 16, 1215 PATIENT IS DECEASED PATIENT DISCHARGED OR EXPIRED Performed at [facility], [address], [place], [place] [address]    Culture STAPHYLOCOCCUS AUREUS (A)  Final   Report Status 07/03/2022 FINAL  Final   Organism ID, Bacteria STAPHYLOCOCCUS AUREUS  Final      Susceptibility   Staphylococcus aureus -  MIC*    CIPROFLOXACIN >=8 RESISTANT Resistant     ERYTHROMYCIN <=0.25 SENSITIVE Sensitive     GENTAMICIN <=0.5 SENSITIVE Sensitive     OXACILLIN 0.5 SENSITIVE Sensitive     TETRACYCLINE <=1 SENSITIVE Sensitive     VANCOMYCIN 1 SENSITIVE Sensitive     TRIMETH/SULFA <=10 SENSITIVE Sensitive     CLINDAMYCIN <=0.25 SENSITIVE Sensitive     RIFAMPIN <=0.5 SENSITIVE Sensitive     Inducible Clindamycin NEGATIVE Sensitive     * STAPHYLOCOCCUS AUREUS  Blood  Culture ID Panel (Reflexed)     Status: Abnormal   Collection Time: July 11, 2022  4:27 PM  Result Value Ref Range Status   Enterococcus faecalis NOT DETECTED NOT DETECTED Final   Enterococcus Faecium NOT DETECTED NOT DETECTED Final   Listeria monocytogenes NOT DETECTED NOT DETECTED Final   Staphylococcus species DETECTED (A) NOT DETECTED Final    Comment: CRITICAL RESULT CALLED TO, READ BACK BY AND VERIFIED WITH: PHARMD MADELINE MITCHELL [id number] AT 1217 BY EC PT IS DECEASED    Staphylococcus aureus (BCID) DETECTED (A) NOT DETECTED Final    Comment: CRITICAL RESULT CALLED TO, READ BACK BY AND VERIFIED WITH: PHARMD MADELINE MITCHELL [id number] AT 1217 BY EC PT IS DECEASED    Staphylococcus epidermidis NOT DETECTED NOT DETECTED Final   Staphylococcus lugdunensis NOT DETECTED NOT DETECTED Final   Streptococcus species NOT DETECTED NOT DETECTED Final   Streptococcus agalactiae NOT DETECTED NOT DETECTED Final   Streptococcus pneumoniae NOT DETECTED NOT DETECTED Final   Streptococcus pyogenes NOT DETECTED NOT DETECTED Final   A.calcoaceticus-baumannii NOT DETECTED NOT DETECTED Final   Bacteroides fragilis NOT DETECTED NOT DETECTED Final   Enterobacterales NOT DETECTED NOT DETECTED Final   Enterobacter cloacae complex NOT DETECTED NOT DETECTED Final   Escherichia coli NOT DETECTED NOT DETECTED Final   Klebsiella aerogenes NOT DETECTED NOT DETECTED Final   Klebsiella oxytoca NOT DETECTED NOT DETECTED Final   Klebsiella pneumoniae NOT DETECTED NOT DETECTED Final   Proteus species NOT DETECTED NOT DETECTED Final   Salmonella species NOT DETECTED NOT DETECTED Final   Serratia marcescens NOT DETECTED NOT DETECTED Final   Haemophilus influenzae NOT DETECTED NOT DETECTED Final   Neisseria meningitidis NOT DETECTED NOT DETECTED Final   Pseudomonas aeruginosa NOT DETECTED NOT DETECTED Final   Stenotrophomonas maltophilia NOT DETECTED NOT DETECTED Final   Candida albicans NOT DETECTED NOT DETECTED Final    Candida auris NOT DETECTED NOT DETECTED Final   Candida glabrata NOT DETECTED NOT DETECTED Final   Candida krusei NOT DETECTED NOT DETECTED Final   Candida parapsilosis NOT DETECTED NOT DETECTED Final   Candida tropicalis NOT DETECTED NOT DETECTED Final   Cryptococcus neoformans/gattii NOT DETECTED NOT DETECTED Final   Meth resistant mecA/C and MREJ NOT DETECTED NOT DETECTED Final    Comment: Performed at [facility], [address], [place], [place] [address]  Resp panel by RT-PCR (RSV, Flu A&B, Covid) Anterior Nasal Swab     Status: None   Collection Time: 11-Jul-2022  4:31 PM   Specimen: Anterior Nasal Swab  Result Value Ref Range Status   SARS Coronavirus 2 by RT PCR NEGATIVE NEGATIVE Final   Influenza A by PCR NEGATIVE NEGATIVE Final   Influenza B by PCR NEGATIVE NEGATIVE Final    Comment: (NOTE) The Xpert Xpress SARS-CoV-2/FLU/RSV plus assay is intended as an aid in the diagnosis of influenza from Nasopharyngeal swab specimens and should not be used as a sole basis for treatment. Nasal washings and aspirates  are unacceptable for Xpert Xpress SARS-CoV-2/FLU/RSV testing.  Fact Sheet for Patients: BloggerCourse.com  Fact Sheet for Healthcare Providers: SeriousBroker.it  This test is not yet approved or cleared by the [place] FDA and has been authorized for detection and/or diagnosis of SARS-CoV-2 by FDA under an Emergency Use Authorization (EUA). This EUA will remain in effect (meaning this test can be used) for the duration of the COVID-19 declaration under Section 564(b)(1) of the Act, 21 U.S.C. section 360bbb-3(b)(1), unless the authorization is terminated or revoked.     Resp Syncytial Virus by PCR NEGATIVE NEGATIVE Final    Comment: (NOTE) Fact Sheet for Patients: BloggerCourse.com  Fact Sheet for Healthcare Providers: SeriousBroker.it  This test is not  yet approved or cleared by the [place] FDA and has been authorized for detection and/or diagnosis of SARS-CoV-2 by FDA under an Emergency Use Authorization (EUA). This EUA will remain in effect (meaning this test can be used) for the duration of the COVID-19 declaration under Section 564(b)(1) of the Act, 21 U.S.C. section 360bbb-3(b)(1), unless the authorization is terminated or revoked.  Performed at [facility], [address] [place], [place] [address]   MRSA Next Gen by PCR, Nasal     Status: None   Collection Time: 2022/07/15  8:04 PM   Specimen: Nasal Mucosa; Nasal Swab  Result Value Ref Range Status   MRSA by PCR Next Gen NOT DETECTED NOT DETECTED Final    Comment: (NOTE) The GeneXpert MRSA Assay (FDA approved for NASAL specimens only), is one component of a comprehensive MRSA colonization surveillance program. It is not intended to diagnose MRSA infection nor to guide or monitor treatment for MRSA infections. Test performance is not FDA approved in patients less than 106 years old. Performed at [facility], [address], [place], [place] [address]     Lab Basic Metabolic Panel: Recent Labs  Lab 15-Jul-2022 1614 July 15, 2022 1621 Jul 15, 2022 2030 07/15/2022 2318  NA 128*  128* 130* 131* 131*  K 5.2*  5.2* 5.4* 5.3* 5.8*  CL 103 96*  --  99  CO2  --  <7*  --  8*  GLUCOSE 73 77  --  138*  BUN 53* 53*  --  54*  CREATININE 4.30* 4.04*  --  3.20*  CALCIUM  --  9.2  --  7.8*   Liver Function Tests: Recent Labs  Lab 2022/07/15 1621 07-15-2022 2318  AST 169* 397*  ALT 69* 96*  ALKPHOS 93 69  BILITOT 2.9* 2.9*  PROT 6.8 4.9*  ALBUMIN 2.6* 1.8*   Recent Labs  Lab 07/15/2022 1621  LIPASE 23   Recent Labs  Lab 15-Jul-2022 2141  AMMONIA 69*   CBC: Recent Labs  Lab 07-15-2022 1614 07/15/2022 1621 2022/07/15 2030  WBC  --  15.4*  --   NEUTROABS  --  11.1*  --   HGB 15.0  15.3 13.0 10.9*  HCT 44.0  45.0 43.3 32.0*  MCV  --  107.2*  --   PLT  --  199   --    Cardiac Enzymes: No results for input(s): "CKTOTAL", "CKMB", "CKMBINDEX", "TROPONINI" in the last 168 hours. Sepsis Labs: Recent Labs  Lab Jul 15, 2022 1620 2022-07-15 1621 Jul 15, 2022 1807  WBC  --  15.4*  --   LATICACIDVEN >9.0*  --  >9.0*    Procedures/Operations     Anderia Lorenzo 07/06/2022, 4:17 PM

## 2022-07-18 DEATH — deceased
